# Patient Record
Sex: Female | Born: 1945
Health system: Southern US, Community
[De-identification: ages and names within clinical notes are randomized; demographics above are authoritative.]

## PROBLEM LIST (undated history)

## (undated) DIAGNOSIS — J189 Pneumonia, unspecified organism: Secondary | ICD-10-CM

## (undated) DIAGNOSIS — I10 Essential (primary) hypertension: Secondary | ICD-10-CM

## (undated) DIAGNOSIS — IMO0001 Reserved for inherently not codable concepts without codable children: Secondary | ICD-10-CM

## (undated) DIAGNOSIS — F32A Depression, unspecified: Secondary | ICD-10-CM

## (undated) DIAGNOSIS — I509 Heart failure, unspecified: Secondary | ICD-10-CM

## (undated) DIAGNOSIS — H269 Unspecified cataract: Secondary | ICD-10-CM

## (undated) DIAGNOSIS — J449 Chronic obstructive pulmonary disease, unspecified: Secondary | ICD-10-CM

## (undated) DIAGNOSIS — C801 Malignant (primary) neoplasm, unspecified: Secondary | ICD-10-CM

## (undated) DIAGNOSIS — D649 Anemia, unspecified: Secondary | ICD-10-CM

## (undated) DIAGNOSIS — Z5189 Encounter for other specified aftercare: Secondary | ICD-10-CM

## (undated) DIAGNOSIS — F329 Major depressive disorder, single episode, unspecified: Secondary | ICD-10-CM

## (undated) DIAGNOSIS — R911 Solitary pulmonary nodule: Secondary | ICD-10-CM

## (undated) HISTORY — PX: APPENDECTOMY: SHX54

## (undated) HISTORY — PX: ESOPHAGOGASTRODUODENOSCOPY ENDOSCOPY: SHX5814

## (undated) HISTORY — DX: Essential (primary) hypertension: I10

## (undated) HISTORY — DX: Heart failure, unspecified: I50.9

## (undated) HISTORY — DX: Solitary pulmonary nodule: R91.1

## (undated) HISTORY — DX: Pneumonia, unspecified organism: J18.9

## (undated) HISTORY — DX: Major depressive disorder, single episode, unspecified: F32.9

## (undated) HISTORY — DX: Encounter for other specified aftercare: Z51.89

## (undated) HISTORY — DX: Reserved for inherently not codable concepts without codable children: IMO0001

## (undated) HISTORY — DX: Anemia, unspecified: D64.9

## (undated) HISTORY — PX: TUBAL LIGATION: SHX77

## (undated) HISTORY — DX: Depression, unspecified: F32.A

## (undated) HISTORY — PX: TONSILLECTOMY: SUR1361

## (undated) HISTORY — PX: LUNG SURGERY: SHX703

---

## 1998-02-01 ENCOUNTER — Other Ambulatory Visit: Admission: RE | Admit: 1998-02-01 | Discharge: 1998-02-01 | Payer: Self-pay | Admitting: *Deleted

## 2005-07-10 ENCOUNTER — Ambulatory Visit: Payer: Self-pay | Admitting: Internal Medicine

## 2005-09-10 ENCOUNTER — Ambulatory Visit: Payer: Self-pay | Admitting: Internal Medicine

## 2005-10-05 ENCOUNTER — Ambulatory Visit: Payer: Self-pay | Admitting: Internal Medicine

## 2005-10-09 ENCOUNTER — Ambulatory Visit: Payer: Self-pay | Admitting: *Deleted

## 2008-10-11 ENCOUNTER — Ambulatory Visit: Payer: Self-pay | Admitting: Family Medicine

## 2008-10-11 DIAGNOSIS — I10 Essential (primary) hypertension: Secondary | ICD-10-CM | POA: Insufficient documentation

## 2008-10-11 DIAGNOSIS — M549 Dorsalgia, unspecified: Secondary | ICD-10-CM | POA: Insufficient documentation

## 2008-10-13 LAB — CONVERTED CEMR LAB
ALT: 17 units/L (ref 0–35)
AST: 22 units/L (ref 0–37)
Albumin: 3.7 g/dL (ref 3.5–5.2)
Alkaline Phosphatase: 50 units/L (ref 39–117)
BUN: 9 mg/dL (ref 6–23)
Basophils Absolute: 0 10*3/uL (ref 0.0–0.1)
Basophils Relative: 0.3 % (ref 0.0–3.0)
Bilirubin, Direct: 0.1 mg/dL (ref 0.0–0.3)
CO2: 26 meq/L (ref 19–32)
Calcium: 8.8 mg/dL (ref 8.4–10.5)
Chloride: 103 meq/L (ref 96–112)
Cholesterol: 180 mg/dL (ref 0–200)
Creatinine, Ser: 0.5 mg/dL (ref 0.4–1.2)
Eosinophils Absolute: 0.1 10*3/uL (ref 0.0–0.7)
Eosinophils Relative: 1.8 % (ref 0.0–5.0)
GFR calc Af Amer: 161 mL/min
GFR calc non Af Amer: 133 mL/min
Glucose, Bld: 91 mg/dL (ref 70–99)
HCT: 40.3 % (ref 36.0–46.0)
HDL: 70.7 mg/dL (ref >39.0)
Hemoglobin: 14.1 g/dL (ref 12.0–15.0)
LDL Cholesterol: 94 mg/dL (ref 0–99)
Lymphocytes Relative: 32.3 % (ref 12.0–46.0)
MCHC: 35.1 g/dL (ref 30.0–36.0)
MCV: 98.9 fL (ref 78.0–100.0)
Monocytes Absolute: 0.4 10*3/uL (ref 0.1–1.0)
Monocytes Relative: 7.8 % (ref 3.0–12.0)
Neutro Abs: 3.1 10*3/uL (ref 1.4–7.7)
Neutrophils Relative %: 57.8 % (ref 43.0–77.0)
Platelets: 199 10*3/uL (ref 150–400)
Potassium: 4 meq/L (ref 3.5–5.1)
RBC: 4.07 M/uL (ref 3.87–5.11)
RDW: 12.1 % (ref 11.5–14.6)
Sodium: 135 meq/L (ref 135–145)
Total Bilirubin: 0.8 mg/dL (ref 0.3–1.2)
Total CHOL/HDL Ratio: 2.5
Total Protein: 6.3 g/dL (ref 6.0–8.3)
Triglycerides: 78 mg/dL (ref 0–149)
VLDL: 16 mg/dL (ref 0–40)
WBC: 5.3 10*3/uL (ref 4.5–10.5)

## 2008-10-15 ENCOUNTER — Encounter (INDEPENDENT_AMBULATORY_CARE_PROVIDER_SITE_OTHER): Payer: Self-pay | Admitting: *Deleted

## 2008-10-29 ENCOUNTER — Ambulatory Visit: Payer: Self-pay | Admitting: Family Medicine

## 2008-11-12 LAB — CONVERTED CEMR LAB
BUN: 10 mg/dL (ref 6–23)
CO2: 28 meq/L (ref 19–32)
Calcium: 8.7 mg/dL (ref 8.4–10.5)
Chloride: 95 meq/L — ABNORMAL LOW (ref 96–112)
Creatinine, Ser: 0.5 mg/dL (ref 0.4–1.2)
GFR calc Af Amer: 161 mL/min
GFR calc non Af Amer: 133 mL/min
Glucose, Bld: 86 mg/dL (ref 70–99)
Potassium: 4.5 meq/L (ref 3.5–5.1)
Sodium: 128 meq/L — ABNORMAL LOW (ref 135–145)

## 2008-11-15 ENCOUNTER — Encounter (INDEPENDENT_AMBULATORY_CARE_PROVIDER_SITE_OTHER): Payer: Self-pay | Admitting: *Deleted

## 2009-01-28 ENCOUNTER — Ambulatory Visit: Payer: Self-pay | Admitting: Family Medicine

## 2009-02-01 ENCOUNTER — Encounter (INDEPENDENT_AMBULATORY_CARE_PROVIDER_SITE_OTHER): Payer: Self-pay | Admitting: *Deleted

## 2009-02-01 LAB — CONVERTED CEMR LAB
BUN: 8 mg/dL (ref 6–23)
CO2: 28 meq/L (ref 19–32)
Calcium: 8.6 mg/dL (ref 8.4–10.5)
Chloride: 94 meq/L — ABNORMAL LOW (ref 96–112)
Creatinine, Ser: 0.5 mg/dL (ref 0.4–1.2)
GFR calc non Af Amer: 132.85 mL/min (ref >60)
Glucose, Bld: 87 mg/dL (ref 70–99)
Potassium: 3.7 meq/L (ref 3.5–5.1)
Sodium: 127 meq/L — ABNORMAL LOW (ref 135–145)

## 2009-10-11 ENCOUNTER — Ambulatory Visit: Payer: Self-pay | Admitting: Family Medicine

## 2009-11-28 ENCOUNTER — Ambulatory Visit: Payer: Self-pay | Admitting: Family Medicine

## 2009-11-28 DIAGNOSIS — N390 Urinary tract infection, site not specified: Secondary | ICD-10-CM | POA: Insufficient documentation

## 2009-11-28 DIAGNOSIS — J069 Acute upper respiratory infection, unspecified: Secondary | ICD-10-CM | POA: Insufficient documentation

## 2009-11-28 LAB — CONVERTED CEMR LAB
Bilirubin Urine: NEGATIVE
Glucose, Urine, Semiquant: NEGATIVE
Ketones, urine, test strip: NEGATIVE
Nitrite: NEGATIVE
Protein, U semiquant: NEGATIVE
Specific Gravity, Urine: 1.02
Urobilinogen, UA: 0.2
WBC Urine, dipstick: NEGATIVE
pH: 6.5

## 2009-11-30 ENCOUNTER — Encounter: Payer: Self-pay | Admitting: Family Medicine

## 2009-12-06 ENCOUNTER — Telehealth (INDEPENDENT_AMBULATORY_CARE_PROVIDER_SITE_OTHER): Payer: Self-pay | Admitting: *Deleted

## 2009-12-12 ENCOUNTER — Ambulatory Visit: Payer: Self-pay | Admitting: Family Medicine

## 2009-12-12 LAB — CONVERTED CEMR LAB
Bilirubin Urine: NEGATIVE
Blood in Urine, dipstick: NEGATIVE
Glucose, Urine, Semiquant: NEGATIVE
Ketones, urine, test strip: NEGATIVE
Nitrite: NEGATIVE
Protein, U semiquant: NEGATIVE
Specific Gravity, Urine: 1.015
Urobilinogen, UA: 0.2
WBC Urine, dipstick: NEGATIVE
pH: 7

## 2009-12-15 LAB — CONVERTED CEMR LAB
ALT: 18 units/L (ref 0–35)
AST: 22 units/L (ref 0–37)
Albumin: 3.7 g/dL (ref 3.5–5.2)
Alkaline Phosphatase: 59 units/L (ref 39–117)
BUN: 15 mg/dL (ref 6–23)
Bilirubin, Direct: 0.1 mg/dL (ref 0.0–0.3)
CO2: 30 meq/L (ref 19–32)
Calcium: 8.9 mg/dL (ref 8.4–10.5)
Chloride: 103 meq/L (ref 96–112)
Cholesterol: 163 mg/dL (ref 0–200)
Creatinine, Ser: 0.7 mg/dL (ref 0.4–1.2)
GFR calc non Af Amer: 89.85 mL/min (ref >60)
Glucose, Bld: 85 mg/dL (ref 70–99)
HDL: 63 mg/dL (ref >39.00)
LDL Cholesterol: 77 mg/dL (ref 0–99)
Potassium: 4.9 meq/L (ref 3.5–5.1)
Sodium: 135 meq/L (ref 135–145)
Total Bilirubin: 0.3 mg/dL (ref 0.3–1.2)
Total CHOL/HDL Ratio: 3
Total Protein: 6.4 g/dL (ref 6.0–8.3)
Triglycerides: 114 mg/dL (ref 0.0–149.0)
VLDL: 22.8 mg/dL (ref 0.0–40.0)

## 2010-08-21 ENCOUNTER — Ambulatory Visit: Payer: Self-pay | Admitting: Family Medicine

## 2010-11-07 NOTE — Assessment & Plan Note (Signed)
Summary: RENEW MEDICATION/RH......   Vital Signs:  Patient profile:   65 year old female Weight:      151 pounds Temp:     98.2 degrees F oral Pulse rate:   68 / minute Pulse rhythm:   regular BP sitting:   118 / 60  (left arm) Cuff size:   regular  Vitals Entered By: Army Fossa CMA (October 11, 2009 3:37 PM) CC: follow up on medication    History of Present Illness:  Hypertension follow-up      This is a 65 year old woman who presents for Hypertension follow-up.  The patient denies lightheadedness, urinary frequency, headaches, edema, impotence, rash, and fatigue.  The patient denies the following associated symptoms: chest pain, chest pressure, exercise intolerance, dyspnea, palpitations, syncope, leg edema, and pedal edema.  Compliance with medications (by patient report) has been near 100%.  The patient reports that dietary compliance has been good.  Adjunctive measures currently used by the patient include salt restriction.    Current Medications (verified): 1)  Lisinopril-Hydrochlorothiazide 20-25 Mg Tabs (Lisinopril-Hydrochlorothiazide) .Marland Kitchen.. 1 By Mouth Once Daily. Needs Office Visit Before Additional Refills.  Allergies: 1)  ! Codeine  Past History:  Past medical, surgical, family and social histories (including risk factors) reviewed for relevance to current acute and chronic problems.  Past Medical History: Reviewed history from 10/11/2008 and no changes required. Hypertension  Family History: Reviewed history and no changes required.  Social History: Reviewed history from 10/11/2008 and no changes required. Occupation: Current Smoker  Review of Systems      See HPI  Physical Exam  General:  Well-developed,well-nourished,in no acute distress; alert,appropriate and cooperative throughout examination Lungs:  Normal respiratory effort, chest expands symmetrically. Lungs are clear to auscultation, no crackles or wheezes. Heart:  normal rate and no murmur.    Extremities:  No clubbing, cyanosis, edema, or deformity noted with normal full range of motion of all joints.   Skin:  Intact without suspicious lesions or rashes Psych:  Oriented X3 and normally interactive.     Impression & Recommendations:  Problem # 1:  HYPERTENSION (ICD-401.9)  Her updated medication list for this problem includes:    Lisinopril-hydrochlorothiazide 20-25 Mg Tabs (Lisinopril-hydrochlorothiazide) .Marland Kitchen... 1 by mouth once daily. needs office visit before additional refills.  BP today: 118/60 Prior BP: 110/64 (01/28/2009)  Labs Reviewed: K+: 3.7 (01/28/2009) Creat: : 0.5 (01/28/2009)   Chol: 180 (10/11/2008)   HDL: 70.7 (10/11/2008)   LDL: 94 (10/11/2008)   TG: 78 (10/11/2008)  Complete Medication List: 1)  Lisinopril-hydrochlorothiazide 20-25 Mg Tabs (Lisinopril-hydrochlorothiazide) .Marland Kitchen.. 1 by mouth once daily. needs office visit before additional refills.  Patient Instructions: 1)  RTO 3 MONTHS CPE Prescriptions: LISINOPRIL-HYDROCHLOROTHIAZIDE 20-25 MG TABS (LISINOPRIL-HYDROCHLOROTHIAZIDE) 1 by mouth once daily. NEEDS OFFICE VISIT BEFORE ADDITIONAL REFILLS.  #30 x 3   Entered and Authorized by:   Loreen Freud DO   Signed by:   Loreen Freud DO on 10/11/2009   Method used:   [facility]        [facility] [address] [phone]* (retail)       [address]       [place], [place]  [address]       Ph: [phone] or [phone]       Fax: [phone]   RxID:   [id number]

## 2010-11-07 NOTE — Assessment & Plan Note (Signed)
Summary: FOLLOW UP AND MED RENEWAL//PH   Vital Signs:  Patient profile:   65 year old female Weight:      154 pounds Pulse rate:   62 / minute Pulse rhythm:   regular BP sitting:   120 / 84  (left arm) Cuff size:   regular  Vitals Entered By: Army Fossa CMA (December 12, 2009 3:23 PM) CC: follow up on urinary sypmtoms doing much better, refill on BP meds   History of Present Illness: Pt here to recheck urinary symptoms---   they have resolved and recheck bp.   No other complaints.  Current Medications (verified): 1)  Lisinopril-Hydrochlorothiazide 20-25 Mg Tabs (Lisinopril-Hydrochlorothiazide) .Marland Kitchen.. 1 By Mouth Once Daily. Needs Office Visit Before Additional Refills.  Allergies (verified): 1)  ! Codeine  Past History:  Past medical, surgical, family and social histories (including risk factors) reviewed for relevance to current acute and chronic problems.  Past Medical History: Reviewed history from 10/11/2008 and no changes required. Hypertension  Family History: Reviewed history and no changes required.  Social History: Reviewed history from 10/11/2008 and no changes required. Occupation: Current Smoker  Review of Systems      See HPI  Physical Exam  General:  Well-developed,well-nourished,in no acute distress; alert,appropriate and cooperative throughout examination Lungs:  Normal respiratory effort, chest expands symmetrically. Lungs are clear to auscultation, no crackles or wheezes. Heart:  normal rate and no murmur.   Extremities:  No clubbing, cyanosis, edema, or deformity noted with normal full range of motion of all joints.   Psych:  Cognition and judgment appear intact. Alert and cooperative with normal attention span and concentration. No apparent delusions, illusions, hallucinations   Impression & Recommendations:  Problem # 1:  HYPERTENSION (ICD-401.9)  Her updated medication list for this problem includes:    Lisinopril-hydrochlorothiazide  20-25 Mg Tabs (Lisinopril-hydrochlorothiazide) .Marland Kitchen... 1 by mouth once daily. needs office visit before additional refills.  Orders: Venipuncture ([address]) TLB-Lipid Panel (80061-LIPID) TLB-BMP (Basic Metabolic Panel-BMET) (80048-METABOL) TLB-Hepatic/Liver Function Pnl (80076-HEPATIC)  BP today: 120/84 Prior BP: 116/64 (11/28/2009)  Labs Reviewed: K+: 3.7 (01/28/2009) Creat: : 0.5 (01/28/2009)   Chol: 180 (10/11/2008)   HDL: 70.7 (10/11/2008)   LDL: 94 (10/11/2008)   TG: 78 (10/11/2008)  Problem # 2:  UTI (ICD-599.0) Assessment: Improved  Encouraged to push clear liquids, get enough rest, and take acetaminophen as needed. To be seen in 10 days if no improvement, sooner if worse.  Complete Medication List: 1)  Lisinopril-hydrochlorothiazide 20-25 Mg Tabs (Lisinopril-hydrochlorothiazide) .Marland Kitchen.. 1 by mouth once daily. needs office visit before additional refills. Prescriptions: LISINOPRIL-HYDROCHLOROTHIAZIDE 20-25 MG TABS (LISINOPRIL-HYDROCHLOROTHIAZIDE) 1 by mouth once daily. NEEDS OFFICE VISIT BEFORE ADDITIONAL REFILLS.  #30 x 5   Entered and Authorized by:   Loreen Freud DO   Signed by:   Loreen Freud DO on 12/12/2009   Method used:   [facility]        [facility] [address] [phone]* (retail)       [address]       [place], [place]  [address]       Ph: [phone] or [phone]       Fax: [phone]   RxID:   [id number]   Laboratory Results   Urine Tests    Routine Urinalysis   Color: yellow Appearance: Clear Glucose: negative   (Normal Range: Negative) Bilirubin: negative   (Normal Range: Negative) Ketone: negative   (Normal Range: Negative) Spec. Gravity: 1.015   (  Normal Range: 1.003-1.035) Blood: negative   (Normal Range: Negative) pH: 7.0   (Normal Range: 5.0-8.0) Protein: negative   (Normal Range: Negative) Urobilinogen: 0.2   (Normal Range: 0-1) Nitrite: negative   (Normal Range: Negative) Leukocyte Esterace: negative    (Normal Range: Negative)    Comments: Army Fossa CMA  December 12, 2009 3:28 PM

## 2010-11-07 NOTE — Progress Notes (Signed)
Summary: Lab Results  Phone Note Outgoing Call   Summary of Call: Regarding lab results, LMTCB:  + UTI---on cipro Signed by Loreen Freud DO on 12/03/2009 at 9:24 PM  Follow-up for Phone Call        Pt is aware. Army Fossa CMA  December 06, 2009 10:26 AM

## 2010-11-07 NOTE — Assessment & Plan Note (Signed)
Summary: acute pain left hip/cbs   Vital Signs:  Patient Profile:   65 Years Old Female Weight:      134.6 pounds Temp:     98.0 degrees F oral Pulse rate:   60 / minute BP sitting:   160 / 100  (left arm)  Pt. in pain?   no  Vitals Entered By: Jeremy Johann CMA (October 11, 2008 11:47 AM)                  PCP:  Laury Axon  Chief Complaint:  pull mucsle in lower left back and Back pain.  History of Present Illness:  Back Pain      This is a 65 year old woman who presents with Back pain.  The symptoms began duration > 3 days ago.  Pt here c/o L low back pain that radiates to calf.  No known injury.  The patient denies fever, chills, weakness, loss of sensation, fecal incontinence, urinary incontinence, urinary retention, dysuria, rest pain, inability to work, and inability to care for self.  The pain is located in the left low back.  The pain began at home.  The pain radiates to the left hip and left buttock.  The pain is made worse by standing or walking and activity.  The pain is made better by inactivity and NSAID medications.  Risk factors for serious underlying conditions include duration of pain > 1 month.  Pt tried heat  and Ibuprofen with no relief.      Serial Vital Signs/Assessments:  Time      Position  BP       Pulse  Resp  Temp     By 12:02 PM            160/100                        Loreen Freud DO    Current Allergies (reviewed today): ! CODEINE  Past Medical History:    Hypertension   Social History:    Occupation:    Current Smoker   Risk Factors:  Tobacco use:  current    Counseled to quit/cut down tobacco use:  yes   Review of Systems      See HPI   Physical Exam  General:     Well-developed,well-nourished,in no acute distress; alert,appropriate and cooperative throughout examination Neck:     No deformities, masses, or tenderness noted. Lungs:     Normal respiratory effort, chest expands symmetrically. Lungs are clear to auscultation,  no crackles or wheezes. Heart:     normal rate and regular rhythm.   Msk:     normal ROM, no joint tenderness, no joint swelling, no joint warmth, no redness over joints, no joint deformities, no joint instability, and no crepitation.   Extremities:     No clubbing, cyanosis, edema, or deformity noted with normal full range of motion of all joints.   Neurologic:     cranial nerves II-XII intact, strength normal in all extremities, gait normal, and DTRs symmetrical and normal.   Skin:     Intact without suspicious lesions or rashes Psych:     Cognition and judgment appear intact. Alert and cooperative with normal attention span and concentration. No apparent delusions, illusions, hallucinations    Impression & Recommendations:  Problem # 1:  HYPERTENSION (ICD-401.9)  Her updated medication list for this problem includes:    Lisinopril-hydrochlorothiazide 10-12.5 Mg Tabs (Lisinopril-hydrochlorothiazide) .Marland KitchenMarland KitchenMarland KitchenMarland Kitchen 1  by mouth once daily  Orders: Venipuncture ([address]) TLB-Lipid Panel (80061-LIPID) TLB-BMP (Basic Metabolic Panel-BMET) (80048-METABOL) TLB-CBC Platelet - w/Differential (85025-CBCD) TLB-Hepatic/Liver Function Pnl (80076-HEPATIC)  BP today: 160/100   Problem # 2:  BACK PAIN (ICD-724.5)  Her updated medication list for this problem includes:    Flexeril 10 Mg Tabs (Cyclobenzaprine hcl) .Marland Kitchen... 1 by mouth three times a day as needed Discussed use of moist heat or ice, modified activities, medications, and stretching/strengthening exercises. Back care instructions given. To be seen in 2 weeks if no improvement; sooner if worsening of symptoms.   Complete Medication List: 1)  Lisinopril-hydrochlorothiazide 10-12.5 Mg Tabs (Lisinopril-hydrochlorothiazide) .Marland Kitchen.. 1 by mouth once daily 2)  Flexeril 10 Mg Tabs (Cyclobenzaprine hcl) .Marland Kitchen.. 1 by mouth three times a day as needed   Patient Instructions: 1)  rto 2-3 weeks 2)  Eat more potassium rich foods such as bananas,orange juice  and salt substitutes. 3)  Tobacco is very bad for your health and your loved ones! You Should stop smoking!Marland Kitchen 4)  Most patients (90%) with low back pain will improve with time (2-6 weeks). Keep active but avoid activities that are painful. Apply moist heat and/or ice to lower back several times a day.   Prescriptions: FLEXERIL 10 MG TABS (CYCLOBENZAPRINE HCL) 1 by mouth three times a day as needed  #30 x 0   Entered and Authorized by:   Loreen Freud DO   Signed by:   Loreen Freud DO on 10/11/2008   Method used:   [facility] to        [facility] [address] [phone]* (retail)       [address]       [place], [place]  [address]       Ph: [phone] or [phone]       Fax: [phone]   RxID:   [id number] LISINOPRIL-HYDROCHLOROTHIAZIDE 10-12.5 MG TABS (LISINOPRIL-HYDROCHLOROTHIAZIDE) 1 by mouth once daily  #30 x 2   Entered and Authorized by:   Loreen Freud DO   Signed by:   Loreen Freud DO on 10/11/2008   Method used:   Electronically to        [facility] [address] [phone]* (retail)       [address]       [place], [place]  [address]       Ph: [phone] or [phone]       Fax: [phone]   RxID:   [id number]  ]  Past Medical History:    Hypertension

## 2010-11-07 NOTE — Assessment & Plan Note (Signed)
Summary: BLADDER INFECTION?/KDC   Vital Signs:  Patient profile:   65 year old female Height:      63 inches Weight:      154 pounds BMI:     27.38 Temp:     98.1 degrees F oral Pulse rate:   68 / minute Pulse rhythm:   regular BP sitting:   116 / 64  (left arm) Cuff size:   regular  Vitals Entered By: Army Fossa CMA (November 28, 2009 2:47 PM) CC: Pt c/o feeling like she has to urinate more frequently, painful when urniating. , Dysuria   History of Present Illness:  Dysuria      This is a 65 year old woman who presents with Dysuria.  The symptoms began 2 days ago.  The patient complains of burning with urination and urgency, but denies urinary frequency, hematuria, vaginal discharge, vaginal itching, and vaginal sores.  The patient denies the following associated symptoms: nausea, vomiting, fever, shaking chills, flank pain, abdominal pain, back pain, pelvic pain, and arthralgias.  The patient denies the following risk factors: diabetes, prior antibiotics, immunosuppression, history of GU anomaly, history of pyelonephritis, history of STD, and analgesic abuse.  History is significant for no urinary tract problems.    Allergies: 1)  ! Codeine  Past History:  Past medical, surgical, family and social histories (including risk factors) reviewed for relevance to current acute and chronic problems.  Past Medical History: Reviewed history from 10/11/2008 and no changes required. Hypertension  Family History: Reviewed history and no changes required.  Social History: Reviewed history from 10/11/2008 and no changes required. Occupation: Current Smoker  Review of Systems      See HPI  Physical Exam  General:  Well-developed,well-nourished,in no acute distress; alert,appropriate and cooperative throughout examination Abdomen:  soft and non-tender.   Psych:  Oriented X3 and normally interactive.     Impression & Recommendations:  Problem # 1:  UTI (ICD-599.0)  Her  updated medication list for this problem includes:    Cipro 500 Mg Tabs (Ciprofloxacin hcl) .Marland Kitchen... 1 by mouth two times a day  Encouraged to push clear liquids, get enough rest, and take acetaminophen as needed. To be seen in 10 days if no improvement, sooner if worse.  Orders: T-Culture, Urine ([id number])  Complete Medication List: 1)  Lisinopril-hydrochlorothiazide 20-25 Mg Tabs (Lisinopril-hydrochlorothiazide) .Marland Kitchen.. 1 by mouth once daily. needs office visit before additional refills. 2)  Cipro 500 Mg Tabs (Ciprofloxacin hcl) .Marland Kitchen.. 1 by mouth two times a day  Patient Instructions: 1)  recheck urine 2 weeks Prescriptions: CIPRO 500 MG TABS (CIPROFLOXACIN HCL) 1 by mouth two times a day  #10 x 0   Entered and Authorized by:   Loreen Freud DO   Signed by:   Loreen Freud DO on 11/28/2009   Method used:   Electronically to        [facility] [address] [phone]* (retail)       [address]       [place], [place]  [address]       Ph: [phone] or [phone]       Fax: [phone]   RxID:   [id number]   Laboratory Results   Urine Tests    Routine Urinalysis   Color: yellow Appearance: Clear Glucose: negative   (Normal Range: Negative) Bilirubin: negative   (Normal Range: Negative) Ketone: negative   (Normal Range: Negative) Spec. Gravity: 1.020   (Normal Range: 1.003-1.035)  Blood: large   (Normal Range: Negative) pH: 6.5   (Normal Range: 5.0-8.0) Protein: negative   (Normal Range: Negative) Urobilinogen: 0.2   (Normal Range: 0-1) Nitrite: negative   (Normal Range: Negative) Leukocyte Esterace: negative   (Normal Range: Negative)    Comments: Army Fossa CMA  November 28, 2009 2:57 PM     Appended Document: BLADDER INFECTION?/KDC  Laboratory Results   Urine Tests   Date/Time Reported: November 29, 2009 1:25 PM   Routine Urinalysis   Color: yellow Appearance: Clear Glucose: negative   (Normal Range: Negative) Bilirubin:  negative   (Normal Range: Negative) Ketone: moderate (40)   (Normal Range: Negative) Spec. Gravity: 1.020   (Normal Range: 1.003-1.035) Blood: moderate   (Normal Range: Negative) pH: 6.0   (Normal Range: 5.0-8.0) Protein: negative   (Normal Range: Negative) Urobilinogen: negative   (Normal Range: 0-1) Nitrite: positive   (Normal Range: Negative) Leukocyte Esterace: negative   (Normal Range: Negative)    Comments: Floydene Flock  November 29, 2009 1:26 PM cx sent

## 2010-11-07 NOTE — Letter (Signed)
Summary: Results Follow-up Letter  [facility]  [address] [place], [place] [address]   Phone: [phone]  Fax: [phone]    10/15/2008        Pam Cameron [address] [place], [place]  [address]  Dear [name],   The following are the results of your recent test(s):  Test     Result     Pap Smear    Normal_______  Not Normal_____       Comments: _________________________________________________________ Cholesterol LDL(Bad cholesterol):          Your goal is less than:         HDL (Good cholesterol):        Your goal is more than: _________________________________________________________ Other Tests:   _________________________________________________________  Please call for an appointment Or Please see attached labs._________________________________________________________ _________________________________________________________ _________________________________________________________  Sincerely,  Felecia Deloach CMA [facility]

## 2010-11-07 NOTE — Assessment & Plan Note (Signed)
Summary: refill med/cbs   Vital Signs:  Patient profile:   65 year old female Weight:      157.4 pounds Pulse rate:   76 / minute Pulse rhythm:   regular BP sitting:   116 / 70  (left arm) Cuff size:   regular  Vitals Entered By: Almeta Monas CMA Duncan Dull) (August 21, 2010 3:05 PM) CC: OV to    History of Present Illness:  Hypertension follow-up      This is a 65 year old woman who presents for Hypertension follow-up.  The patient denies lightheadedness, urinary frequency, headaches, edema, impotence, rash, and fatigue.  The patient denies the following associated symptoms: chest pain, chest pressure, exercise intolerance, dyspnea, palpitations, syncope, leg edema, and pedal edema.  Compliance with medications (by patient report) has been near 100%.  The patient reports that dietary compliance has been good.  The patient reports no exercise.  Adjunctive measures currently used by the patient include salt restriction.    Allergies: 1)  ! Codeine  Past History:  Past medical, surgical, family and social histories (including risk factors) reviewed for relevance to current acute and chronic problems.  Past Medical History: Reviewed history from 10/11/2008 and no changes required. Hypertension  Family History: Reviewed history and no changes required.  Social History: Reviewed history from 10/11/2008 and no changes required. Occupation: Current Smoker  Physical Exam  General:  Well-developed,well-nourished,in no acute distress; alert,appropriate and cooperative throughout examination Neck:  No deformities, masses, or tenderness noted. Lungs:  Normal respiratory effort, chest expands symmetrically. Lungs are clear to auscultation, no crackles or wheezes. Heart:  Normal rate and regular rhythm. S1 and S2 normal without gallop, murmur, click, rub or other extra sounds. Extremities:  No clubbing, cyanosis, edema, or deformity noted with normal full range of motion of all joints.    Psych:  Cognition and judgment appear intact. Alert and cooperative with normal attention span and concentration. No apparent delusions, illusions, hallucinations   Impression & Recommendations:  Problem # 1:  HYPERTENSION (ICD-401.9)  Her updated medication list for this problem includes:    Lisinopril-hydrochlorothiazide 20-25 Mg Tabs (Lisinopril-hydrochlorothiazide) .Marland Kitchen... 1 by mouth once daily.  BP today: 116/70 Prior BP: 120/84 (12/12/2009)  Labs Reviewed: K+: 4.9 (12/12/2009) Creat: : 0.7 (12/12/2009)   Chol: 163 (12/12/2009)   HDL: 63.00 (12/12/2009)   LDL: 77 (12/12/2009)   TG: 114.0 (12/12/2009)  Complete Medication List: 1)  Lisinopril-hydrochlorothiazide 20-25 Mg Tabs (Lisinopril-hydrochlorothiazide) .Marland Kitchen.. 1 by mouth once daily.  Patient Instructions: 1)  Please schedule a follow-up appointment in 6 months---cpe  Prescriptions: LISINOPRIL-HYDROCHLOROTHIAZIDE 20-25 MG TABS (LISINOPRIL-HYDROCHLOROTHIAZIDE) 1 by mouth once daily.  #30 x 5   Entered and Authorized by:   Loreen Freud DO   Signed by:   Loreen Freud DO on 08/21/2010   Method used:   Electronically to        [facility] [address] [phone]* (retail)       [address]       [place], [place]  [address]       Ph: [phone] or [phone]       Fax: [phone]   RxID:   [id number]    Orders Added: 1)  Est. Patient Level III [[address]]

## 2011-02-06 ENCOUNTER — Other Ambulatory Visit: Payer: Self-pay | Admitting: Family Medicine

## 2011-02-17 ENCOUNTER — Encounter: Payer: Self-pay | Admitting: Family Medicine

## 2011-02-20 ENCOUNTER — Encounter: Payer: Self-pay | Admitting: Family Medicine

## 2011-02-20 ENCOUNTER — Ambulatory Visit (INDEPENDENT_AMBULATORY_CARE_PROVIDER_SITE_OTHER): Payer: 59 | Admitting: Family Medicine

## 2011-02-20 VITALS — HR 84 | Temp 98.9°F | Wt 160.8 lb

## 2011-02-20 DIAGNOSIS — I10 Essential (primary) hypertension: Secondary | ICD-10-CM

## 2011-02-20 MED ORDER — LISINOPRIL-HYDROCHLOROTHIAZIDE 10-12.5 MG PO TABS: 1.0000 | ORAL_TABLET | Freq: Every day | ORAL | Status: DC

## 2011-02-20 NOTE — Progress Notes (Signed)
  Subjective:    Patient here for follow-up of elevated blood pressure.  She is not exercising and is adherent to a low-salt diet.  Blood pressure is well controlled at home. Cardiac symptoms: none. Patient denies: chest pain, dyspnea, fatigue and palpitations. Cardiovascular risk factors: hypertension, sedentary lifestyle and smoking/ tobacco exposure. Use of agents associated with hypertension: none. History of target organ damage: none.  The following portions of the patient's history were reviewed and updated as appropriate: allergies, current medications, past family history, past medical history, past social history, past surgical history and problem list.  Review of Systems Pertinent items are noted in HPI.     Objective:    Pulse 84  Temp(Src) 98.9 F (37.2 C) (Oral)  Wt 160 lb 12.8 oz (72.938 kg)  SpO2 96% General appearance: alert, cooperative, appears stated age and no distress Neck: no adenopathy, no carotid bruit, no JVD, supple, symmetrical, trachea midline and thyroid not enlarged, symmetric, no tenderness/mass/nodules Lungs: clear to auscultation bilaterally Heart: regular rate and rhythm, S1, S2 normal, no murmur, click, rub or gallop Extremities: extremities normal, atraumatic, no cyanosis or edema    Assessment:    Hypertension, normal blood pressure . Evidence of target organ damage: none.    Plan:    Medication: decrease to lisinopril 10/12.5 daily. Dietary sodium restriction. Regular aerobic exercise. Follow up: 3 weeks and as needed.

## 2011-02-20 NOTE — Patient Instructions (Signed)

## 2011-03-07 ENCOUNTER — Encounter: Payer: Self-pay | Admitting: Family Medicine

## 2011-03-13 ENCOUNTER — Ambulatory Visit (INDEPENDENT_AMBULATORY_CARE_PROVIDER_SITE_OTHER): Payer: 59 | Admitting: Family Medicine

## 2011-03-13 ENCOUNTER — Encounter: Payer: Self-pay | Admitting: Family Medicine

## 2011-03-13 DIAGNOSIS — I10 Essential (primary) hypertension: Secondary | ICD-10-CM

## 2011-03-13 DIAGNOSIS — E785 Hyperlipidemia, unspecified: Secondary | ICD-10-CM

## 2011-03-13 LAB — LIPID PANEL
Cholesterol: 189 mg/dL (ref 0–200)
HDL: 81.8 mg/dL (ref >39.00)
LDL Cholesterol: 94 mg/dL (ref 0–99)
Total CHOL/HDL Ratio: 2
Triglycerides: 64 mg/dL (ref 0.0–149.0)
VLDL: 12.8 mg/dL (ref 0.0–40.0)

## 2011-03-13 LAB — POCT URINALYSIS DIPSTICK
Bilirubin, UA: NEGATIVE
Glucose, UA: NEGATIVE
Ketones, UA: NEGATIVE
Leukocytes, UA: NEGATIVE
Nitrite, UA: NEGATIVE
Protein, UA: NEGATIVE
Spec Grav, UA: 1.005
Urobilinogen, UA: 0.2
pH, UA: 7.5

## 2011-03-13 LAB — BASIC METABOLIC PANEL
BUN: 9 mg/dL (ref 6–23)
CO2: 27 mEq/L (ref 19–32)
Calcium: 9 mg/dL (ref 8.4–10.5)
Chloride: 95 mEq/L — ABNORMAL LOW (ref 96–112)
Creatinine, Ser: 0.7 mg/dL (ref 0.4–1.2)
GFR: 90.99 mL/min (ref >60.00)
Glucose, Bld: 100 mg/dL — ABNORMAL HIGH (ref 70–99)
Potassium: 4.5 mEq/L (ref 3.5–5.1)
Sodium: 130 mEq/L — ABNORMAL LOW (ref 135–145)

## 2011-03-13 LAB — HEPATIC FUNCTION PANEL
ALT: 18 U/L (ref 0–35)
AST: 21 U/L (ref 0–37)
Albumin: 4 g/dL (ref 3.5–5.2)
Alkaline Phosphatase: 66 U/L (ref 39–117)
Bilirubin, Direct: 0.1 mg/dL (ref 0.0–0.3)
Total Bilirubin: 0.6 mg/dL (ref 0.3–1.2)
Total Protein: 6.4 g/dL (ref 6.0–8.3)

## 2011-03-13 MED ORDER — LISINOPRIL-HYDROCHLOROTHIAZIDE 20-12.5 MG PO TABS: 1.0000 | ORAL_TABLET | Freq: Every day | ORAL | Status: DC

## 2011-03-13 NOTE — Progress Notes (Signed)
  Subjective:    Patient ID: Pam Cameron, female    DOB: [birth year], 65 y.o.   MRN: [id number]  Hypertension This is a chronic problem. The current episode started more than 1 year ago. The problem has been gradually improving since onset. The problem is uncontrolled. Pertinent negatives include no anxiety, blurred vision, chest pain, headaches, malaise/fatigue, neck pain, orthopnea, palpitations, peripheral edema, PND, shortness of breath or sweats. (Pt feels better with lower dose but bp is high today.  Repeat was the same.) There are no associated agents to hypertension. Risk factors for coronary artery disease include post-menopausal state, sedentary lifestyle and smoking/tobacco exposure.      Review of Systems  Constitutional: Negative for malaise/fatigue.  HENT: Negative for neck pain.   Eyes: Negative for blurred vision.  Respiratory: Negative for shortness of breath.   Cardiovascular: Negative for chest pain, palpitations, orthopnea and PND.  Neurological: Negative for headaches.  All other systems reviewed and are negative.       Objective:   Physical Exam  Constitutional: She appears well-developed and well-nourished.  Psychiatric: She has a normal mood and affect. Her behavior is normal.          Assessment & Plan:

## 2011-03-13 NOTE — Assessment & Plan Note (Signed)
Check labs today Lisinopril 20 / 12.5 rto 2-3 weeks Pt will think about getting a cuff for home

## 2011-03-13 NOTE — Patient Instructions (Signed)

## 2011-04-04 ENCOUNTER — Ambulatory Visit (INDEPENDENT_AMBULATORY_CARE_PROVIDER_SITE_OTHER): Payer: 59 | Admitting: Family Medicine

## 2011-04-04 ENCOUNTER — Encounter: Payer: Self-pay | Admitting: Family Medicine

## 2011-04-04 VITALS — BP 132/70 | HR 69 | Temp 97.9°F | Wt 161.6 lb

## 2011-04-04 DIAGNOSIS — E871 Hypo-osmolality and hyponatremia: Secondary | ICD-10-CM

## 2011-04-04 DIAGNOSIS — I10 Essential (primary) hypertension: Secondary | ICD-10-CM

## 2011-04-04 LAB — BASIC METABOLIC PANEL
BUN: 6 mg/dL (ref 6–23)
CO2: 29 mEq/L (ref 19–32)
Calcium: 8.8 mg/dL (ref 8.4–10.5)
Chloride: 91 mEq/L — ABNORMAL LOW (ref 96–112)
Creatinine, Ser: 0.5 mg/dL (ref 0.4–1.2)
GFR: 148.98 mL/min (ref >60.00)
Glucose, Bld: 77 mg/dL (ref 70–99)
Potassium: 4.4 mEq/L (ref 3.5–5.1)
Sodium: 127 mEq/L — ABNORMAL LOW (ref 135–145)

## 2011-04-04 NOTE — Progress Notes (Signed)
  Subjective:    Patient here for follow-up of elevated blood pressure.  She is not exercising and is adherent to a low-salt diet.  Blood pressure is not checked  at home. Cardiac symptoms: none. Patient denies: chest pain, chest pressure/discomfort, dyspnea, exertional chest pressure/discomfort, fatigue, irregular heart beat, orthopnea and palpitations. Cardiovascular risk factors: advanced age (older than 103 for men, 33 for women), hypertension and sedentary lifestyle. Use of agents associated with hypertension: none. History of target organ damage: none.  The following portions of the patient's history were reviewed and updated as appropriate: allergies, current medications, past family history, past medical history, past social history, past surgical history and problem list.  Review of Systems Pertinent items are noted in HPI.     Objective:    BP 132/70  Pulse 69  Temp(Src) 97.9 F (36.6 C) (Oral)  Wt 161 lb 9.6 oz (73.301 kg)  SpO2 99% General appearance: alert, cooperative, appears stated age and no distress Lungs: clear to auscultation bilaterally Heart: regular rate and rhythm, S1, S2 normal, no murmur, click, rub or gallop Extremities: extremities normal, atraumatic, no cyanosis or edema    Assessment:    Hypertension, normal blood pressure . Evidence of target organ damage: none.   low sodium----recheck today Plan:    Medication: no change. Regular aerobic exercise. Follow up: 6 months and as needed.

## 2011-04-08 ENCOUNTER — Inpatient Hospital Stay (INDEPENDENT_AMBULATORY_CARE_PROVIDER_SITE_OTHER)
Admission: RE | Admit: 2011-04-08 | Discharge: 2011-04-08 | Disposition: A | Discharge status: Home / Self Care | Payer: 59 | Source: Ambulatory Visit | Attending: Emergency Medicine | Admitting: Emergency Medicine

## 2011-04-08 DIAGNOSIS — S239XXA Sprain of unspecified parts of thorax, initial encounter: Secondary | ICD-10-CM

## 2011-04-16 ENCOUNTER — Inpatient Hospital Stay (INDEPENDENT_AMBULATORY_CARE_PROVIDER_SITE_OTHER)
Admission: RE | Admit: 2011-04-16 | Discharge: 2011-04-16 | Disposition: A | Discharge status: Home / Self Care | Payer: 59 | Source: Ambulatory Visit | Attending: Emergency Medicine | Admitting: Emergency Medicine

## 2011-04-16 DIAGNOSIS — M549 Dorsalgia, unspecified: Secondary | ICD-10-CM

## 2011-04-16 DIAGNOSIS — J40 Bronchitis, not specified as acute or chronic: Secondary | ICD-10-CM

## 2011-04-25 ENCOUNTER — Encounter: Payer: Self-pay | Admitting: Family Medicine

## 2011-04-25 ENCOUNTER — Ambulatory Visit (INDEPENDENT_AMBULATORY_CARE_PROVIDER_SITE_OTHER): Payer: 59 | Admitting: Family Medicine

## 2011-04-25 VITALS — BP 124/82 | HR 78 | Temp 97.9°F | Wt 156.8 lb

## 2011-04-25 DIAGNOSIS — L658 Other specified nonscarring hair loss: Secondary | ICD-10-CM | POA: Insufficient documentation

## 2011-04-25 DIAGNOSIS — L659 Nonscarring hair loss, unspecified: Secondary | ICD-10-CM

## 2011-04-25 LAB — BASIC METABOLIC PANEL
BUN: 8 mg/dL (ref 6–23)
CO2: 27 mEq/L (ref 19–32)
Calcium: 8.4 mg/dL (ref 8.4–10.5)
Chloride: 92 mEq/L — ABNORMAL LOW (ref 96–112)
Creatinine, Ser: 0.7 mg/dL (ref 0.4–1.2)
GFR: 86.6 mL/min (ref >60.00)
Glucose, Bld: 88 mg/dL (ref 70–99)
Potassium: 4.1 mEq/L (ref 3.5–5.1)
Sodium: 124 mEq/L — ABNORMAL LOW (ref 135–145)

## 2011-04-25 LAB — HEPATIC FUNCTION PANEL
ALT: 18 U/L (ref 0–35)
AST: 25 U/L (ref 0–37)
Albumin: 3.8 g/dL (ref 3.5–5.2)
Alkaline Phosphatase: 74 U/L (ref 39–117)
Bilirubin, Direct: 0 mg/dL (ref 0.0–0.3)
Total Bilirubin: 0.5 mg/dL (ref 0.3–1.2)
Total Protein: 6.6 g/dL (ref 6.0–8.3)

## 2011-04-25 LAB — TSH: TSH: 0.73 u[IU]/mL (ref 0.35–5.50)

## 2011-04-25 LAB — T4, FREE: Free T4: 1.21 ng/dL (ref 0.60–1.60)

## 2011-04-25 LAB — T3, FREE: T3, Free: 2.5 pg/mL (ref 2.3–4.2)

## 2011-04-25 NOTE — Progress Notes (Signed)
  Subjective:    Patient ID: Pam Cameron, female    DOB: Sep 08, 1946, 65 y.o.   MRN: [id number]  HPI Pt here c/o hair loss.  Pt went to see her hairdresser who told her to see her doctor.  She has not used any chemicals in her hair.  Pt states she doesn't eat much protein ---she has no other symptoms.   Review of Systems As above    Objective:   Physical Exam  Constitutional: She appears well-developed and well-nourished.  HENT:  Head: Normocephalic and atraumatic.       + mult areas of thinning hair on scalp          Assessment & Plan:

## 2011-04-25 NOTE — Patient Instructions (Signed)
Alopecia Areata, Autoimmune Hair Loss  Alopecia areata is a self-destructing (autoimmune) disease that results in the loss of hair. In this condition your body's immune system attacks the hair follicle. The hair follicle is responsible for growing hair. Hair loss can occur on the scalp and other parts of the body. It usually starts as one or more small, round, smooth patches of hair loss. It occurs in males and females of all ages and races, but usually starts before age 30. The scalp is the most commonly affected area, but the beard or any hair-bearing site can be affected. This type of hair loss does not leave scars where the hair was lost.    Many people with alopecia areata only have a few patches of hair loss. In others, extensive patchy hair loss occurs. In a few people, all scalp hair is lost (alopecia totalis), or hair is lost from the entire scalp and body (alopecia universalis). No matter how widespread the hair loss, the hair follicles remain alive and are ready to resume normal hair production whenever they receive the correct signal. Hair re-growth may occur without treatment and can even restart after years of hair loss.    CAUSES  It is thought that something triggers the immune system to stop hair growth. It is not always known what the cause is. Some people have genetic markers that can increase the chance of developing alopecia areata. Alopecia areata often occurs in families whose members have had:   Asthma.    Hay fever.    Atopic eczema.    Some autoimmune diseases may also be a trigger, such as:    Thyroid disease.    Diabetes.    Rheumatoid arthritis.    Lupus erythematosus.    Vitiligo.    Pernicious anemia.    Addison's disease.   OTHER SYMPTOMS  In some people, the nail beds may develop rows of tiny dents (stippling) or the nail beds can become distorted. Other than the hair and nail beds, no other body part is affected.    PROGNOSIS     Alopecia areata is not medically disabling. People with alopecia areata are usually in excellent health. Hair loss can be emotionally difficult. The National Alopecia Areata Foundation has resources available to help individuals and families with alopecia areata. Their goal is to help people with the condition live full, productive lives. There are many successful, well-adjusted, contented people living with Alopecia areata. Alopecia areata can be overcome with:   A positive self image.    Sound medical facts.    The support of others, such as:    Sometimes professional counseling is helpful to develop one's self-confidence and positive self-image.   TREATMENT  There is no cure for alopecia areata. There are several available treatments. Treatments are most effective in milder cases. No treatment is effective for everyone. Choice of treatment depends mainly on a person's age and the extent of their hair loss.  Alopecia areata occurs in two forms:     A mild patchy form where less than 50 percent of scalp hair is lost.    An extensive form where greater than 50 percent of scalp hair is lost.   These two forms of alopecia areata behave quite differently, and the choice of treatment depends on which form is present. Current treatments do not turn alopecia areata off. They can stimulate the hair follicle to produce hair.    Some medications used to treat mild cases include:      Cortisone injections. The most common treatment is the injection of cortisone into the bare skin patches. The injections are usually given by a caregiver specializing in skin issues (dermatologist). This caregiver will use a tiny needle to give multiple injections into the skin in and around the bare patches. The injections are repeated once a month. If new hair growth occurs, it is usually visible within 4 weeks. Treatment does not prevent new patches of hair loss from developing. There are few side effects from local cortisone injections. Occasionally, temporary dents (depressions) in the skin result from the local injections, but these dents can fill in by themselves.    Topical minoxidil. Five percent topical minoxidil solution applied twice daily may grow hair in alopecia areata. Scalp, eyebrows, and beard hair may respond. If scalp hair re-grows completely, treatment can be stopped. Response may improve if topical cortisone cream is applied 30 minutes after the minoxidil. Topical minoxidil is safe, easy to use, and does not lower blood pressure in persons with normal blood pressure. Minoxidil can lead to unwanted facial hair growth in some people.    Anthralin cream or ointment. Another treatment is the application of anthralin cream or ointment. Anthralin is a tar-like substance that has been used widely for psoriasis. Anthralin is applied to the bare patches once daily. It is washed off after a short time, usually 30 to 60 minutes later. If new hair growth occurs, it is seen in 8 to 12 weeks. Anthralin can be irritating to the skin. It can cause temporary, brownish discoloration of the treated skin. By using short treatment times, skin irritation and skin staining are reduced without decreasing effectiveness. Care must be taken not to get anthralin in the eyes.   Some of the medications used for more extensive cases where there is greater than 50% hair loss include:   Cortisone pills. Cortisone pills are sometimes given for extensive scalp hair loss. Cortisone taken internally is much stronger than local injections of cortisone into the skin. It is necessary to discuss possible side effects of cortisone pills with your caregiver. In general, however, cortisone pills are used in relatively few patients with alopecia areata due to health risks from prolonged use. Also, hair that has grown is likely to fall out when the cortisone pills are stopped.    Topical minoxidil. See previous explanation under mild, patchy alopecia areata. However, Minoxidil is not effective in total loss of scalp hair (alopecia totalis).    Topical immunotherapy. Another method of treating alopecia areata or alopecia totalis/universalis involves producing an allergic rash or allergic contact dermatitis. Chemicals such as diphencyprone (DPCP) or squaric acid dibutyl ester (SADBE) are applied to the scalp to produce an allergic rash which resembles poison oak or ivy. Approximately 40% of patients treated with topical immunotherapy will re-grow scalp hair after about 6 months of treatment. Those who do successfully re-grow scalp hair will need to continue treatment to maintain hair re-growth.    Wigs. For extensive hair loss, a wig can be an important option for some people. Proper attention will make a quality wig look completely natural. A wig will need to be cut, thinned, and styled. To keep a net base wig from falling off, special double-sided tape can be purchased in beauty supply outlets and fastened to the inside of the wig.     For those with completely bare heads, there are suction caps to which any wig can be attached. There are also entire suction cap wig units. These state-of-the-art   wigs which make use of a silicon base to create a secure, vacuum-fit, are comfortable and easily removed by the wearer. Proper fit of a vacuum wig requires that any existing scalp hair be shaved. These wigs can be more expensive than other types of wigs.   For more information contact the National Alopecia Areata Foundation at:  [employer]  [address]  [place] [address]  Phone: [phone]  Fax: [phone]  www.naaf.org  E-mail: info@naaf.org  Document Released: 04/28/2004 Document Re-Released: 03/14/2010  [employer] Patient Information 2011 [employer].

## 2011-04-25 NOTE — Assessment & Plan Note (Signed)
Check labs Pt will watch diet and make sure she is eating enough protein Biotin Consider Derm

## 2011-04-30 ENCOUNTER — Telehealth: Payer: Self-pay | Admitting: *Deleted

## 2011-04-30 ENCOUNTER — Other Ambulatory Visit: Payer: Self-pay | Admitting: Family Medicine

## 2011-04-30 DIAGNOSIS — L659 Nonscarring hair loss, unspecified: Secondary | ICD-10-CM

## 2011-04-30 NOTE — Telephone Encounter (Signed)
Pt would like to know if Dr Laury Axon feels she should go ahead and see specialist at [facility] for hair loss.Please advise

## 2011-04-30 NOTE — Telephone Encounter (Signed)
If she would like to we will put referral in

## 2011-04-30 NOTE — Telephone Encounter (Signed)
Discuss with patient, referral put in awaiting appt info.

## 2011-05-03 ENCOUNTER — Other Ambulatory Visit: Payer: 59

## 2011-05-10 ENCOUNTER — Other Ambulatory Visit: Payer: 59

## 2011-05-11 ENCOUNTER — Ambulatory Visit (INDEPENDENT_AMBULATORY_CARE_PROVIDER_SITE_OTHER): Payer: 59 | Admitting: Internal Medicine

## 2011-05-11 ENCOUNTER — Encounter: Payer: Self-pay | Admitting: Internal Medicine

## 2011-05-11 VITALS — BP 118/70 | HR 76 | Temp 98.5°F | Wt 156.4 lb

## 2011-05-11 DIAGNOSIS — J209 Acute bronchitis, unspecified: Secondary | ICD-10-CM

## 2011-05-11 DIAGNOSIS — B9689 Other specified bacterial agents as the cause of diseases classified elsewhere: Secondary | ICD-10-CM

## 2011-05-11 DIAGNOSIS — F17200 Nicotine dependence, unspecified, uncomplicated: Secondary | ICD-10-CM

## 2011-05-11 DIAGNOSIS — A499 Bacterial infection, unspecified: Secondary | ICD-10-CM

## 2011-05-11 DIAGNOSIS — J329 Chronic sinusitis, unspecified: Secondary | ICD-10-CM

## 2011-05-11 LAB — CBC WITH DIFFERENTIAL/PLATELET
Basophils Absolute: 0 10*3/uL (ref 0.0–0.1)
Basophils Relative: 0.6 % (ref 0.0–3.0)
Eosinophils Absolute: 0.2 10*3/uL (ref 0.0–0.7)
Eosinophils Relative: 2.7 % (ref 0.0–5.0)
HCT: 38.5 % (ref 36.0–46.0)
Hemoglobin: 13.1 g/dL (ref 12.0–15.0)
Lymphocytes Relative: 22.2 % (ref 12.0–46.0)
Lymphs Abs: 1.3 10*3/uL (ref 0.7–4.0)
MCHC: 33.9 g/dL (ref 30.0–36.0)
MCV: 96.7 fl (ref 78.0–100.0)
Monocytes Absolute: 0.5 10*3/uL (ref 0.1–1.0)
Monocytes Relative: 8.3 % (ref 3.0–12.0)
Neutro Abs: 4 10*3/uL (ref 1.4–7.7)
Neutrophils Relative %: 66.2 % (ref 43.0–77.0)
Platelets: 215 10*3/uL (ref 150.0–400.0)
RBC: 3.99 Mil/uL (ref 3.87–5.11)
RDW: 14.1 % (ref 11.5–14.6)
WBC: 6 10*3/uL (ref 4.5–10.5)

## 2011-05-11 MED ORDER — AMOXICILLIN-POT CLAVULANATE 875-125 MG PO TABS: 1.0000 | ORAL_TABLET | Freq: Two times a day (BID) | ORAL | Status: AC

## 2011-05-11 NOTE — Patient Instructions (Signed)
Plain Mucinex for thick secretions ;force NON dairy fluids for next 48 hrs. Use a Neti pot daily as needed for sinus congestion Order for x-rays entered into  the computer; these will be performed at [address] [facility]. across from [facility]. No appointment is necessary. Please think about quitting smoking. Review the risks we discussed. Please call 1-800-QUIT-NOW 5036426467) for free smoking cessation counseling.

## 2011-05-11 NOTE — Progress Notes (Signed)
  Subjective:    Patient ID: Pam Cameron, female    DOB: 1946/07/27, 65 y.o.   MRN: [id number]  HPI Respiratory tract infection Onset/symptoms:7-10 days ago as head congestion Exposures (illness/environmental/extrinsic):? Progression of symptoms:rhinitis with PNDrainage  & cough Treatments/response:none Fever/chills/sweats:no Frontal headache:yes Facial pain:yes Nasal purulence:slightly yellow Sore throat:yes Dental pain:no Lymphadenopathy:no Wheezing/shortness of breath:no Cough/sputum:pale yellow Associated extrinsic/allergic symptoms:itchy eyes/ sneezing:no Smoking history:1 ppd   For the history reveals that she has had similar symptoms for at least 5 weeks. History with a seven-day regimen of an unknown antibiotic at that time from an UC. She does not feel that that was effective beyond a" couple of days".           Review of Systems she is also here to have her "salt" recheck; apparently she has had hyponatremia.  Old labs were reviewed. She's actually had hyponatremia dating at least back to 2010.     Objective:   Physical Exam General appearance is of good health and nourishment; no acute distress or increased work of breathing is present.  No  lymphadenopathy about the head, neck, or axilla noted.   Eyes: No conjunctival inflammation or lid edema is present..  Ears:  External ear exam shows no significant lesions or deformities.  Otoscopic examination reveals clear canals, tympanic membranes are intact bilaterally without bulging, retraction, inflammation or discharge.  Nose:  External nasal examination shows no deformity or inflammation. Nasal mucosa are pink and moist without lesions or exudates. No septal dislocation or dislocation.No obstruction to airflow.   Oral exam: Dental hygiene is good; lips and gums are healthy appearing.There is no oropharyngeal erythema or exudate noted.   Heart:  Normal rate and regular rhythm. S1 and S2 normal without gallop,  murmur, click, rub or other extra sounds.   Lungs:Chest clear to auscultation; no wheezes, rhonchi,rales ,or rubs present.No increased work of breathing.    Extremities:  No cyanosis, edema, or clubbing  noted    Skin: Warm & dry          Assessment & Plan:  #1 rhinosinusitis  #2 bronchitis; the additional history suggests that this is a protracted acute process.  #3 active smoker; risks discussed  #4 codeine intolerance  #5 hyponatremia, chronic. With the chronicity of this problem; it's unlikely that this is related to any pulmonary process. She is on HCTZ combined with an ACE inhibitor.The HCTZ is most likely cause of hyponatremia  Plan see orders and recommendations

## 2011-05-30 ENCOUNTER — Ambulatory Visit (INDEPENDENT_AMBULATORY_CARE_PROVIDER_SITE_OTHER)
Admission: RE | Admit: 2011-05-30 | Discharge: 2011-05-30 | Disposition: A | Discharge status: Home / Self Care | Payer: 59 | Source: Ambulatory Visit | Attending: Internal Medicine | Admitting: Internal Medicine

## 2011-05-30 DIAGNOSIS — F17200 Nicotine dependence, unspecified, uncomplicated: Secondary | ICD-10-CM

## 2011-05-30 DIAGNOSIS — J209 Acute bronchitis, unspecified: Secondary | ICD-10-CM

## 2011-06-02 ENCOUNTER — Other Ambulatory Visit: Payer: Self-pay | Admitting: Family Medicine

## 2011-06-20 ENCOUNTER — Encounter: Payer: Self-pay | Admitting: Family Medicine

## 2011-08-29 ENCOUNTER — Other Ambulatory Visit: Payer: Self-pay | Admitting: Family Medicine

## 2011-10-10 ENCOUNTER — Encounter: Payer: Self-pay | Admitting: Internal Medicine

## 2011-10-10 ENCOUNTER — Ambulatory Visit (INDEPENDENT_AMBULATORY_CARE_PROVIDER_SITE_OTHER): Payer: 59 | Admitting: Family Medicine

## 2011-10-10 ENCOUNTER — Encounter: Payer: Self-pay | Admitting: Family Medicine

## 2011-10-10 VITALS — BP 120/74 | HR 105 | Temp 98.7°F | Wt 145.0 lb

## 2011-10-10 DIAGNOSIS — R935 Abnormal findings on diagnostic imaging of other abdominal regions, including retroperitoneum: Secondary | ICD-10-CM

## 2011-10-10 DIAGNOSIS — Z23 Encounter for immunization: Secondary | ICD-10-CM

## 2011-10-10 DIAGNOSIS — M549 Dorsalgia, unspecified: Secondary | ICD-10-CM

## 2011-10-10 LAB — POCT URINALYSIS DIPSTICK
Bilirubin, UA: NEGATIVE
Blood, UA: NEGATIVE
Glucose, UA: NEGATIVE
Ketones, UA: NEGATIVE
Leukocytes, UA: NEGATIVE
Nitrite, UA: NEGATIVE
Protein, UA: NEGATIVE
Spec Grav, UA: 1.01
Urobilinogen, UA: 0.2
pH, UA: 5

## 2011-10-10 LAB — CBC WITH DIFFERENTIAL/PLATELET
Basophils Absolute: 0.1 10*3/uL (ref 0.0–0.1)
Basophils Relative: 1 % (ref 0.0–3.0)
Eosinophils Absolute: 0.2 10*3/uL (ref 0.0–0.7)
Eosinophils Relative: 2.8 % (ref 0.0–5.0)
HCT: 32.6 % — ABNORMAL LOW (ref 36.0–46.0)
Hemoglobin: 11.2 g/dL — ABNORMAL LOW (ref 12.0–15.0)
Lymphocytes Relative: 23.8 % (ref 12.0–46.0)
Lymphs Abs: 1.4 10*3/uL (ref 0.7–4.0)
MCHC: 34.3 g/dL (ref 30.0–36.0)
MCV: 96.5 fl (ref 78.0–100.0)
Monocytes Absolute: 0.4 10*3/uL (ref 0.1–1.0)
Monocytes Relative: 7.1 % (ref 3.0–12.0)
Neutro Abs: 3.9 10*3/uL (ref 1.4–7.7)
Neutrophils Relative %: 65.3 % (ref 43.0–77.0)
Platelets: 397 10*3/uL (ref 150.0–400.0)
RBC: 3.38 Mil/uL — ABNORMAL LOW (ref 3.87–5.11)
RDW: 13.6 % (ref 11.5–14.6)
WBC: 6 10*3/uL (ref 4.5–10.5)

## 2011-10-10 LAB — BASIC METABOLIC PANEL
BUN: 9 mg/dL (ref 6–23)
CO2: 27 mEq/L (ref 19–32)
Calcium: 8.9 mg/dL (ref 8.4–10.5)
Chloride: 99 mEq/L (ref 96–112)
Creatinine, Ser: 0.6 mg/dL (ref 0.4–1.2)
GFR: 110.98 mL/min (ref >60.00)
Glucose, Bld: 91 mg/dL (ref 70–99)
Potassium: 4.4 mEq/L (ref 3.5–5.1)
Sodium: 132 mEq/L — ABNORMAL LOW (ref 135–145)

## 2011-10-10 LAB — HEPATIC FUNCTION PANEL
ALT: 15 U/L (ref 0–35)
AST: 19 U/L (ref 0–37)
Albumin: 3.8 g/dL (ref 3.5–5.2)
Alkaline Phosphatase: 68 U/L (ref 39–117)
Bilirubin, Direct: 0 mg/dL (ref 0.0–0.3)
Total Bilirubin: 0.5 mg/dL (ref 0.3–1.2)
Total Protein: 6.6 g/dL (ref 6.0–8.3)

## 2011-10-10 NOTE — Assessment & Plan Note (Signed)
Pt wants to hold off on any further workup until after GI sees her

## 2011-10-10 NOTE — Assessment & Plan Note (Signed)
Refer to GI for further evaluation If pt develops any abd pain rto or go to ER--- will hold off on surgery referral until after GI w/u

## 2011-10-10 NOTE — Patient Instructions (Signed)
We will set up an appointment to see a Gastroenterologist----if you develop any abdominal pain, call office or go to Er. We will do your physical in June with fasting labs.

## 2011-10-10 NOTE — Progress Notes (Signed)
  Subjective:    Patient ID: Pam Cameron, female    DOB: 07-27-1946, 66 y.o.   MRN: [id number]  HPI Pt here to review CT scan ordered by chiropractor.  + Gallstones and abnormality in stomach.  Pt needs GI evaluation.  Pt denies any abd pain, indigestion or upper back pain.  No pain with eating.     Review of Systems As above    Objective:   Physical Exam  Constitutional: She is oriented to person, place, and time. She appears well-developed and well-nourished.  Pulmonary/Chest: Effort normal and breath sounds normal.  Abdominal: Soft. She exhibits no distension. There is no tenderness. There is no rebound and no guarding.  Neurological: She is alert and oriented to person, place, and time.  Psychiatric: She has a normal mood and affect. Her behavior is normal.          Assessment & Plan:

## 2011-10-22 ENCOUNTER — Encounter: Payer: Self-pay | Admitting: Internal Medicine

## 2011-10-25 ENCOUNTER — Encounter: Payer: Self-pay | Admitting: Internal Medicine

## 2011-10-25 ENCOUNTER — Ambulatory Visit (INDEPENDENT_AMBULATORY_CARE_PROVIDER_SITE_OTHER): Payer: 59 | Admitting: Internal Medicine

## 2011-10-25 VITALS — BP 140/80 | HR 70 | Ht 63.0 in | Wt 145.2 lb

## 2011-10-25 DIAGNOSIS — D649 Anemia, unspecified: Secondary | ICD-10-CM

## 2011-10-25 DIAGNOSIS — IMO0002 Reserved for concepts with insufficient information to code with codable children: Secondary | ICD-10-CM

## 2011-10-25 DIAGNOSIS — R933 Abnormal findings on diagnostic imaging of other parts of digestive tract: Secondary | ICD-10-CM

## 2011-10-25 DIAGNOSIS — Z1211 Encounter for screening for malignant neoplasm of colon: Secondary | ICD-10-CM

## 2011-10-25 DIAGNOSIS — R9389 Abnormal findings on diagnostic imaging of other specified body structures: Secondary | ICD-10-CM

## 2011-10-25 NOTE — Progress Notes (Signed)
Subjective:    Patient ID: Pam Cameron, female    DOB: [birth year], 66 y.o.   MRN: [id number]  HPI Pam Cameron is a 66 yo female with PMH of HTN, MDD, anemia, tobacco use who is seen in consultation at the request of Dr. Laury Axon for evaluation of abnormal GI imaging. The patient reports developing mid back pain in early December 2012, which started after bending over to adjust something in her shower. She reports hearing a pop and has had mid back pain ever since. She reports being evaluated by chiropractor first, and evaluated with CT scan which showed compression fractures in her lumbar spine. She also reports this imaging study revealed gallstones.  From a symptom standpoint, she is denying GI complaint. She is having daily mid back pain which is dull in nature. Tends to be worse with prolonged standing or when she is changing positions. Occasionally it moves around towards her sides but does not radiate further. She denies abdominal pain. No nausea or vomiting. No heartburn. No early satiety. No dysphagia or odynophagia. Her bowel movements been very regular without bright red blood per rectum or melena. She denies fevers or chills.   Review of Systems Constitutional: Negative for fever, chills, night sweats, activity change, appetite change and unexpected weight change HEENT: Negative for sore throat, mouth sores and trouble swallowing. Eyes: Negative for visual disturbance Respiratory: Negative for cough, chest tightness and shortness of breath Cardiovascular: Negative for chest pain, palpitations and lower extremity swelling Gastrointestinal: See history of present illness Genitourinary: Negative for dysuria and hematuria. Musculoskeletal: Positive for back pain, negative arthralgias and myalgias Skin: Negative for rash or color change Neurological: Negative for headaches, weakness, numbness Hematological: Negative for adenopathy, negative for easy  bruising/bleeding Psychiatric/behavioral: Negative for depressed mood, negative for anxiety   Past Medical History  Diagnosis Date  . Hypertension   . Anemia   . Pneumonia   . Depression    Current Outpatient Prescriptions  Medication Sig Dispense Refill  . IRON PO Take 1 tablet by mouth daily.      Marland Kitchen BIOTIN PO Take by mouth daily.        . flintstones complete (FLINTSTONES) 60 MG chewable tablet Chew 1 tablet by mouth daily.        Marland Kitchen lisinopril-hydrochlorothiazide (PRINZIDE,ZESTORETIC) 20-12.5 MG per tablet TAKE 1 TABLET BY MOUTH DAILY.  30 tablet  2   Allergies  Allergen Reactions  . Codeine     Abdominal cramping    Social History  . Marital Status: Single   Occupational History  . sales    Social History Main Topics  . Smoking status: Current Everyday Smoker  . Smokeless tobacco: None  . Alcohol Use: Yes     moderate  . Drug Use: Yes    Family History  Problem Relation Age of Onset  . Ovarian cancer Mother   . Prostate cancer Father   . Hypertension Father   . Hypertension Mother       Objective:   Physical Exam BP 140/80  Pulse 70  Ht 5\' 3"  (1.6 m)  Wt 145 lb 3.2 oz (65.862 kg)  BMI 25.72 kg/m2 Constitutional: Well-developed and well-nourished. No distress. HEENT: Normocephalic and atraumatic. Oropharynx is clear and moist. No oropharyngeal exudate. Conjunctivae are normal. Pupils are equal round and reactive to light. No scleral icterus. Neck: Neck supple. Trachea midline. Cardiovascular: Normal rate, regular rhythm and intact distal pulses. No M/R/G Pulmonary/chest: Effort normal and breath sounds normal. No wheezing, rales or  rhonchi. Abdominal: Soft, nontender, nondistended. Bowel sounds active throughout. There are no masses palpable. No hepatosplenomegaly. Extremities: no clubbing, cyanosis, or edema Lymphadenopathy: No cervical adenopathy noted. Neurological: Alert and oriented to person place and time. Skin: Skin is warm and dry. No rashes  noted. Psychiatric: Normal mood and affect. Behavior is normal.  CT scan abd/pelvis with contrast 09/18/11 Impression: 1. Asymmetric thickening posterior gastric fundus wall, appearance concerning for an infiltrative process. Upper GI or endoscopy recommended for further assessment. 2. Cholelithiasis. 3. Nondilated calcific atherosclerotic vascular disease 4. T11, T12, and L3 compression fracture deformities as above. Suspect acute endplate disruption lower L3 vertebra. Lumbar spine MRI would be beneficial to assess for marrow edema associated with acute fracture 5. Generalized osteopenia  CBC    Component Value Date/Time   WBC 6.0 10/10/2011 0858   RBC 3.38* 10/10/2011 0858   HGB 11.2* 10/10/2011 0858   HCT 32.6* 10/10/2011 0858   PLT 397.0 10/10/2011 0858   MCV 96.5 10/10/2011 0858   MCHC 34.3 10/10/2011 0858   RDW 13.6 10/10/2011 0858   LYMPHSABS 1.4 10/10/2011 0858   MONOABS 0.4 10/10/2011 0858   EOSABS 0.2 10/10/2011 0858   BASOSABS 0.1 10/10/2011 0858    CMP     Component Value Date/Time   NA 132* 10/10/2011 0858   K 4.4 10/10/2011 0858   CL 99 10/10/2011 0858   CO2 27 10/10/2011 0858   GLUCOSE 91 10/10/2011 0858   BUN 9 10/10/2011 0858   CREATININE 0.6 10/10/2011 0858   CALCIUM 8.9 10/10/2011 0858   PROT 6.6 10/10/2011 0858   ALBUMIN 3.8 10/10/2011 0858   AST 19 10/10/2011 0858   ALT 15 10/10/2011 0858   ALKPHOS 68 10/10/2011 0858   BILITOT 0.5 10/10/2011 0858   GFRNONAA 89.85 12/12/2009 1549   GFRAA 161 10/29/2008 1359       Assessment & Plan:   66 yo female with PMH of HTN, MDD, anemia, tobacco use who is seen in consultation at the request of Dr. Laury Axon for evaluation of abnormal GI imaging  1. Abnl GI Imaging -- the patient is having no concerning GI symptoms at present, however given her abnormal CT scan, I do recommend proceeding with upper endoscopy. We discussed this test including its risks and benefits, and she is agreeable to proceed. This test will be scheduled today.  2. CRC screening -- the  patient is average risk for colorectal cancer screening, and has never had a colonoscopy. She also does not recall ever having done FOBT.  I've recommended colonoscopy for today, and she is agreeable, however she would like this to be done after her upper endoscopy. I will place a recall for colonoscopy in 2-3 months.  3. Back pain -- the patient's back pain is very likely related to her vertebral fractures. I've advised an orthopedic referral for further management. She deferred directed referral today, and states she will contact them when she completes her upper endoscopy. She denied needing pain medications today for back pain  4. Anemia -- the patient does have been normocytic anemia. There is no evidence of iron deficiency, though iron studies have not been performed recently. This anemia does appear to be somewhat new. We will proceed with upper endoscopy, and screening colonoscopy also in the coming months. We'll defer further evaluation to her PCP for now as there does not seem to be overt bleeding.

## 2011-10-25 NOTE — Patient Instructions (Signed)
You have been scheduled for an endoscopy. Please follow written instructions given to you at your visit today.   Dr. Rhea Belton would like to see you back in 3 months for a Colonoscopy.

## 2011-11-05 ENCOUNTER — Ambulatory Visit (AMBULATORY_SURGERY_CENTER): Payer: 59 | Admitting: Internal Medicine

## 2011-11-05 ENCOUNTER — Other Ambulatory Visit: Payer: Self-pay | Admitting: Internal Medicine

## 2011-11-05 ENCOUNTER — Encounter: Payer: Self-pay | Admitting: Internal Medicine

## 2011-11-05 DIAGNOSIS — K2980 Duodenitis without bleeding: Secondary | ICD-10-CM

## 2011-11-05 DIAGNOSIS — K2970 Gastritis, unspecified, without bleeding: Secondary | ICD-10-CM

## 2011-11-05 DIAGNOSIS — K2990 Gastroduodenitis, unspecified, without bleeding: Secondary | ICD-10-CM

## 2011-11-05 DIAGNOSIS — R9389 Abnormal findings on diagnostic imaging of other specified body structures: Secondary | ICD-10-CM

## 2011-11-05 MED ORDER — PANTOPRAZOLE SODIUM 40 MG PO TBEC: 40.0000 mg | DELAYED_RELEASE_TABLET | Freq: Every day | ORAL | Status: DC

## 2011-11-05 MED ORDER — SODIUM CHLORIDE 0.9 % IV SOLN: 500.0000 mL | INTRAVENOUS | Status: DC

## 2011-11-05 NOTE — Progress Notes (Signed)
Patient did not experience any of the following events: a burn prior to discharge; a fall within the facility; wrong site/side/patient/procedure/implant event; or a hospital transfer or hospital admission upon discharge from the facility. (G8907) Patient did not have preoperative order for IV antibiotic SSI prophylaxis. (G8918)  

## 2011-11-05 NOTE — Op Note (Signed)
[facility] [address] [place], [place]  [address]  ENDOSCOPY PROCEDURE REPORT  PATIENT:  Pam, Cameron  MR#:  [id number] BIRTHDATE:  [birth year], 65 yrs. old  GENDER:  female ENDOSCOPIST:  Carie Caddy. Lakyra Tippins, MD Referred by:  Loreen Freud, DO PROCEDURE DATE:  11/05/2011 PROCEDURE:  EGD with biopsy, 19147 ASA CLASS:  Class II INDICATIONS:  abnormal imaging (CT abd) MEDICATIONS:   MAC sedation, administered by CRNA, propofol (Diprivan) 300 mg IV TOPICAL ANESTHETIC:  none  DESCRIPTION OF PROCEDURE:   After the risks benefits and alternatives of the procedure were thoroughly explained, informed consent was obtained.  The LB [id number]-[id number] endoscope was introduced through the mouth and advanced to the second portion of the duodenum, without limitations.  The instrument was slowly withdrawn as the mucosa was fully examined. <<PROCEDUREIMAGES>>  The esophagus and gastroesophageal junction were completely normal in appearance.  Moderate gastritis was found in the total stomach. Multiple biopsies were obtained and sent to pathology.  Question of portal gastropathy in the gastric cardia and fundus. Duodenitis characterized by erythema and superficial, punctate ulceration was found in the bulb of the duodenum. Multiple biopsies were obtained and sent to pathology.    Retroflexed views revealed findings as previously described.    The scope was then withdrawn from the patient and the procedure completed.  COMPLICATIONS:  None  ENDOSCOPIC IMPRESSION: 1) Normal esophagus 2) Moderate gastritis in the total stomach.  Multiple biopsies performed and sent to pathology. 3) Question of portal gastropathy in the cardia/fundus 4) Duodenitis in the bulb of duodenum. Multiple biopsies performed and sent to pathology.  RECOMMENDATIONS: 1) Await pathology results 2) Begin pantoprazole 40 mg daily for acid suppression.  This should be taken 30 minutes to 1 hour before your 1st meal  of the day. 3) Further recommendations after pathology results are available.   Carie Caddy. Shuronda Santino, MD  CC:  Lelon Perla, DO The Patient  n. eSIGNEDCarie Caddy. Luddie Boghosian at 11/05/2011 10:44 AM  Concha [place], [phone]

## 2011-11-05 NOTE — Patient Instructions (Addendum)
FOLLOW DISCHARGE INSTRUCTIONS (BLUE AND GREEN SHEETS).  BEGIN PANTOPRAZOLE 40 MG. DAILY FOR ACID SUPPRESSION. SHOULD BE TAKEN 30 MINUTES BEFORE FIRST MEAL OF THE DAY.

## 2011-11-06 ENCOUNTER — Telehealth: Payer: Self-pay | Admitting: *Deleted

## 2011-11-06 NOTE — Telephone Encounter (Signed)
No answer, no voice mail on follow-up call.

## 2011-11-09 ENCOUNTER — Encounter: Payer: Self-pay | Admitting: *Deleted

## 2011-11-12 ENCOUNTER — Telehealth: Payer: Self-pay | Admitting: *Deleted

## 2011-11-12 ENCOUNTER — Encounter: Payer: Self-pay | Admitting: Internal Medicine

## 2011-11-12 MED ORDER — AMOXICILL-CLARITHRO-LANSOPRAZ PO MISC: ORAL | Status: DC

## 2011-11-12 NOTE — Telephone Encounter (Signed)
Message copied by Florene Glen on Mon Nov 12, 2011  8:25 AM ------      Message from: Beverley Fiedler      Created: Fri Nov 09, 2011  4:09 PM       H. pylori positive by pathology      Please start the patient on triple therapy      She should follow with me in a few months

## 2011-11-12 NOTE — Telephone Encounter (Signed)
Notified pt she is positive for H. Pylori and needs to start antibiotics. Pt stated understanding and reminder sent to f/u in a few months.

## 2011-11-13 ENCOUNTER — Other Ambulatory Visit: Payer: Self-pay | Admitting: Family Medicine

## 2011-11-13 ENCOUNTER — Other Ambulatory Visit: Payer: Self-pay | Admitting: Gastroenterology

## 2011-11-13 ENCOUNTER — Other Ambulatory Visit: Payer: 59

## 2011-11-13 DIAGNOSIS — Z1211 Encounter for screening for malignant neoplasm of colon: Secondary | ICD-10-CM

## 2011-11-13 LAB — FECAL OCCULT BLOOD, IMMUNOCHEMICAL: Fecal Occult Bld: NEGATIVE

## 2011-11-26 ENCOUNTER — Telehealth: Payer: Self-pay | Admitting: Internal Medicine

## 2011-11-26 ENCOUNTER — Other Ambulatory Visit: Payer: Self-pay | Admitting: Internal Medicine

## 2011-11-26 ENCOUNTER — Other Ambulatory Visit: Payer: Self-pay | Admitting: Family Medicine

## 2011-11-26 DIAGNOSIS — IMO0002 Reserved for concepts with insufficient information to code with codable children: Secondary | ICD-10-CM

## 2011-11-26 DIAGNOSIS — S32000A Wedge compression fracture of unspecified lumbar vertebra, initial encounter for closed fracture: Secondary | ICD-10-CM

## 2011-11-26 MED ORDER — LISINOPRIL-HYDROCHLOROTHIAZIDE 20-12.5 MG PO TABS: 1.0000 | ORAL_TABLET | Freq: Every day | ORAL | Status: DC

## 2011-11-26 NOTE — Telephone Encounter (Signed)
Faxed.   KP 

## 2011-11-26 NOTE — Telephone Encounter (Signed)
Pt wanted advice on her compression fractures. Explained Dr Rhea Belton hasn't been here long enough to get to know all the docs in the area. Suggested she see an IR md for possible vertibropasty or kyphoplasty. Pt notifed of an appt with Dr Treasa School at [facility] on 12/04/11 at 2pm. She was given directions to go past the [facility] , turn right and follow the signs to radiology; pt stated understanding. Faxed info to [phone] attn: Karene Fry.

## 2011-12-04 ENCOUNTER — Ambulatory Visit (HOSPITAL_COMMUNITY)
Admission: RE | Admit: 2011-12-04 | Discharge: 2011-12-04 | Disposition: A | Discharge status: Home / Self Care | Payer: 59 | Source: Ambulatory Visit | Attending: Internal Medicine | Admitting: Internal Medicine

## 2011-12-04 DIAGNOSIS — IMO0002 Reserved for concepts with insufficient information to code with codable children: Secondary | ICD-10-CM

## 2011-12-05 ENCOUNTER — Other Ambulatory Visit (HOSPITAL_COMMUNITY): Payer: Self-pay | Admitting: Interventional Radiology

## 2011-12-05 ENCOUNTER — Other Ambulatory Visit: Payer: Self-pay | Admitting: Radiology

## 2011-12-05 DIAGNOSIS — IMO0002 Reserved for concepts with insufficient information to code with codable children: Secondary | ICD-10-CM

## 2011-12-07 ENCOUNTER — Other Ambulatory Visit: Payer: Self-pay | Admitting: Radiology

## 2011-12-13 ENCOUNTER — Telehealth (HOSPITAL_COMMUNITY): Payer: Self-pay

## 2011-12-13 NOTE — Telephone Encounter (Signed)
Spoke with Pam Cameron to make sure we were still on schedule for procedure

## 2011-12-14 ENCOUNTER — Encounter (HOSPITAL_COMMUNITY): Payer: Self-pay

## 2011-12-14 ENCOUNTER — Ambulatory Visit (HOSPITAL_COMMUNITY)
Admission: RE | Admit: 2011-12-14 | Discharge: 2011-12-14 | Disposition: A | Discharge status: Home / Self Care | Payer: 59 | Source: Ambulatory Visit | Attending: Interventional Radiology | Admitting: Interventional Radiology

## 2011-12-14 ENCOUNTER — Other Ambulatory Visit (HOSPITAL_COMMUNITY): Payer: Self-pay | Admitting: Interventional Radiology

## 2011-12-14 DIAGNOSIS — IMO0002 Reserved for concepts with insufficient information to code with codable children: Secondary | ICD-10-CM

## 2011-12-14 DIAGNOSIS — M8448XA Pathological fracture, other site, initial encounter for fracture: Secondary | ICD-10-CM | POA: Insufficient documentation

## 2011-12-14 DIAGNOSIS — M810 Age-related osteoporosis without current pathological fracture: Secondary | ICD-10-CM | POA: Insufficient documentation

## 2011-12-14 DIAGNOSIS — I10 Essential (primary) hypertension: Secondary | ICD-10-CM | POA: Insufficient documentation

## 2011-12-14 DIAGNOSIS — F3289 Other specified depressive episodes: Secondary | ICD-10-CM | POA: Insufficient documentation

## 2011-12-14 DIAGNOSIS — F329 Major depressive disorder, single episode, unspecified: Secondary | ICD-10-CM | POA: Insufficient documentation

## 2011-12-14 HISTORY — PX: FIXATION KYPHOPLASTY LUMBAR SPINE: SHX1642

## 2011-12-14 LAB — CBC
HCT: 38.4 % (ref 36.0–46.0)
Hemoglobin: 13.8 g/dL (ref 12.0–15.0)
MCH: 31.8 pg (ref 26.0–34.0)
MCHC: 35.9 g/dL (ref 30.0–36.0)
MCV: 88.5 fL (ref 78.0–100.0)
Platelets: 219 10*3/uL (ref 150–400)
RBC: 4.34 MIL/uL (ref 3.87–5.11)
RDW: 13 % (ref 11.5–15.5)
WBC: 5.6 10*3/uL (ref 4.0–10.5)

## 2011-12-14 LAB — PROTIME-INR
INR: 0.91 (ref 0.00–1.49)
Prothrombin Time: 12.4 seconds (ref 11.6–15.2)

## 2011-12-14 LAB — APTT: aPTT: 31 seconds (ref 24–37)

## 2011-12-14 LAB — COMPREHENSIVE METABOLIC PANEL
ALT: 14 U/L (ref 0–35)
AST: 23 U/L (ref 0–37)
Albumin: 3.8 g/dL (ref 3.5–5.2)
Alkaline Phosphatase: 70 U/L (ref 39–117)
BUN: 7 mg/dL (ref 6–23)
CO2: 28 mEq/L (ref 19–32)
Calcium: 9.1 mg/dL (ref 8.4–10.5)
Chloride: 93 mEq/L — ABNORMAL LOW (ref 96–112)
Creatinine, Ser: 0.59 mg/dL (ref 0.50–1.10)
GFR calc Af Amer: >90 mL/min (ref >90)
GFR calc non Af Amer: >90 mL/min (ref >90)
Glucose, Bld: 72 mg/dL (ref 70–99)
Potassium: 3.8 mEq/L (ref 3.5–5.1)
Sodium: 128 mEq/L — ABNORMAL LOW (ref 135–145)
Total Bilirubin: 0.4 mg/dL (ref 0.3–1.2)
Total Protein: 7 g/dL (ref 6.0–8.3)

## 2011-12-14 MED ORDER — FENTANYL CITRATE 0.05 MG/ML IJ SOLN
INTRAMUSCULAR | Status: AC
  Filled 2011-12-14: qty 4

## 2011-12-14 MED ORDER — HYDROMORPHONE HCL PF 1 MG/ML IJ SOLN
INTRAMUSCULAR | Status: AC
  Administered 2011-12-14: 1 mg via INTRAVENOUS
  Filled 2011-12-14: qty 2

## 2011-12-14 MED ORDER — SODIUM CHLORIDE 0.9 % IV SOLN: INTRAVENOUS | Status: AC

## 2011-12-14 MED ORDER — SODIUM CHLORIDE 0.9 % IV SOLN
Freq: Once | INTRAVENOUS | Status: AC
  Administered 2011-12-14: 75 mL/h via INTRAVENOUS

## 2011-12-14 MED ORDER — HYDROMORPHONE HCL PF 1 MG/ML IJ SOLN: 1.0000 mg | Freq: Once | INTRAMUSCULAR | Status: DC

## 2011-12-14 MED ORDER — TOBRAMYCIN SULFATE 1.2 G IJ SOLR
INTRAMUSCULAR | Status: AC
  Filled 2011-12-14: qty 1.2

## 2011-12-14 MED ORDER — CEFAZOLIN SODIUM 1-5 GM-% IV SOLN
1.0000 g | Freq: Once | INTRAVENOUS | Status: AC
  Administered 2011-12-14: 1 g via INTRAVENOUS

## 2011-12-14 MED ORDER — MIDAZOLAM HCL 5 MG/5ML IJ SOLN
INTRAMUSCULAR | Status: AC | PRN
  Administered 2011-12-14 (×5): 1 mg via INTRAVENOUS

## 2011-12-14 MED ORDER — FENTANYL CITRATE 0.05 MG/ML IJ SOLN
INTRAMUSCULAR | Status: AC | PRN
  Administered 2011-12-14 (×5): 25 ug via INTRAVENOUS

## 2011-12-14 MED ORDER — MIDAZOLAM HCL 2 MG/2ML IJ SOLN
INTRAMUSCULAR | Status: AC
  Filled 2011-12-14: qty 6

## 2011-12-14 MED ORDER — CEFAZOLIN SODIUM 1-5 GM-% IV SOLN
INTRAVENOUS | Status: AC
  Filled 2011-12-14: qty 50

## 2011-12-14 NOTE — H&P (Signed)
Pam Cameron is an 66 y.o. female.   Chief Complaint: back pain; T11; T12; L3 fractures HPI: scheduled today for Vertebroplasty vs Kyohoplasty  Past Medical History  Diagnosis Date  . Hypertension   . Anemia   . Pneumonia   . Depression   . Blood transfusion     Past Surgical History  Procedure Date  . Appendectomy   . Tonsillectomy   . Lung surgery     fluid removed after pneumonia    Family History  Problem Relation Age of Onset  . Ovarian cancer Mother   . Prostate cancer Father   . Hypertension Father   . Hypertension Mother    Social History:  reports that she has been smoking.  She has never used smokeless tobacco. She reports that she drinks about 1.5 ounces of alcohol per week. She reports that she does not use illicit drugs.  Allergies:  Allergies  Allergen Reactions  . Codeine     Abdominal cramping    Medications Prior to Admission  Medication Sig Dispense Refill  . BIOTIN PO Take by mouth daily.        . flintstones complete (FLINTSTONES) 60 MG chewable tablet Chew 1 tablet by mouth daily.        . IRON PO Take 1 tablet by mouth daily.      Marland Kitchen lisinopril-hydrochlorothiazide (PRINZIDE,ZESTORETIC) 20-12.5 MG per tablet Take 1 tablet by mouth daily.  30 tablet  2  . pantoprazole (PROTONIX) 40 MG tablet Take 1 tablet (40 mg total) by mouth daily.  30 tablet  5  . amoxicillin-clarithromycin-lansoprazole (PREVPAC) combo pack Follow package directions for 14 days of therapy.  1 kit  0   Medications Prior to Admission  Medication Dose Route Frequency Provider Last Rate Last Dose  . 0.9 %  sodium chloride infusion   Intravenous Once Robet Leu, PA      . ceFAZolin (ANCEF) IVPB 1 g/50 mL premix  1 g Intravenous Once Robet Leu, PA        Results for orders placed during the hospital encounter of 12/14/11 (from the past 48 hour(s))  APTT     Status: Normal   Collection Time   12/14/11  6:54 AM      Component Value Range Comment   aPTT 31  24 - 37  (seconds)   CBC     Status: Normal   Collection Time   12/14/11  6:54 AM      Component Value Range Comment   WBC 5.6  4.0 - 10.5 (K/uL)    RBC 4.34  3.87 - 5.11 (MIL/uL)    Hemoglobin 13.8  12.0 - 15.0 (g/dL)    HCT 21.3  08.6 - 57.8 (%)    MCV 88.5  78.0 - 100.0 (fL)    MCH 31.8  26.0 - 34.0 (pg)    MCHC 35.9  30.0 - 36.0 (g/dL)    RDW 46.9  62.9 - 52.8 (%)    Platelets 219  150 - 400 (K/uL)   PROTIME-INR     Status: Normal   Collection Time   12/14/11  6:54 AM      Component Value Range Comment   Prothrombin Time 12.4  11.6 - 15.2 (seconds)    INR 0.91  0.00 - 1.49     No results found.  Review of Systems  Constitutional: Negative for fever.  Respiratory: Negative for cough.   Cardiovascular: Negative for chest pain.  Gastrointestinal: Negative for nausea  and vomiting.  Neurological: Negative for headaches.    Blood pressure 125/70, pulse 75, temperature 96.2 F (35.7 C), temperature source Oral, resp. rate 18, height 5\' 3"  (1.6 m), weight 150 lb (68.04 kg), SpO2 98.00%. Physical Exam  Constitutional: She is oriented to person, place, and time. She appears well-developed and well-nourished.  HENT:  Head: Normocephalic.  Eyes: EOM are normal.  Neck: Normal range of motion.  Cardiovascular: Normal rate, regular rhythm and normal heart sounds.   No murmur heard. Respiratory: Effort normal and breath sounds normal. She has no wheezes.  GI: Soft. Bowel sounds are normal. There is no tenderness.  Musculoskeletal: Normal range of motion.  Neurological: She is alert and oriented to person, place, and time.  Skin: Skin is warm and dry.     Assessment/Plan T11; T12; L3 fractures. Pt scheduled for VP vs KP of these fractures Aware of procedure benefits and risks and agreeable to proceed. Consent signed.  Pam Cameron 12/14/2011, 7:33 AM

## 2011-12-14 NOTE — Procedures (Signed)
S/P KP at L# and VP T11 and T 12

## 2011-12-17 ENCOUNTER — Telehealth (HOSPITAL_COMMUNITY): Payer: Self-pay | Admitting: Radiology

## 2011-12-20 ENCOUNTER — Other Ambulatory Visit (HOSPITAL_COMMUNITY): Payer: Self-pay | Admitting: Interventional Radiology

## 2011-12-20 DIAGNOSIS — IMO0002 Reserved for concepts with insufficient information to code with codable children: Secondary | ICD-10-CM

## 2011-12-27 ENCOUNTER — Telehealth: Payer: Self-pay | Admitting: Family Medicine

## 2011-12-27 DIAGNOSIS — Z9189 Other specified personal risk factors, not elsewhere classified: Secondary | ICD-10-CM

## 2011-12-27 NOTE — Telephone Encounter (Signed)
Patient has requested that a Bone Density Test be done when she comes in for her CPE 6.17.13. Can you please review her appointment & advise on how I should schedule this  Thanks for the help

## 2011-12-27 NOTE — Telephone Encounter (Signed)
Please advise      KP 

## 2011-12-27 NOTE — Telephone Encounter (Signed)
Ok to put in order

## 2011-12-28 ENCOUNTER — Ambulatory Visit (HOSPITAL_COMMUNITY)
Admission: RE | Admit: 2011-12-28 | Discharge: 2011-12-28 | Disposition: A | Discharge status: Home / Self Care | Payer: 59 | Source: Ambulatory Visit | Attending: Interventional Radiology | Admitting: Interventional Radiology

## 2011-12-28 DIAGNOSIS — IMO0002 Reserved for concepts with insufficient information to code with codable children: Secondary | ICD-10-CM

## 2012-01-09 ENCOUNTER — Telehealth: Payer: Self-pay | Admitting: *Deleted

## 2012-01-09 NOTE — Telephone Encounter (Signed)
Message copied by Florene Glen on Wed Jan 09, 2012  2:44 PM ------      Message from: Florene Glen      Created: Mon Nov 12, 2011  8:47 AM       Call to schedule f/u post h. pylori

## 2012-01-10 NOTE — Telephone Encounter (Signed)
I have tried several times to reach pt. She has no answering machine and has left work.

## 2012-01-11 NOTE — Telephone Encounter (Signed)
Attempted to reach pt by phone again today with no luck. Mailed pt a letter asking her to contact us.

## 2012-01-22 ENCOUNTER — Telehealth: Payer: Self-pay | Admitting: *Deleted

## 2012-01-22 DIAGNOSIS — Z8619 Personal history of other infectious and parasitic diseases: Secondary | ICD-10-CM

## 2012-01-22 NOTE — Telephone Encounter (Signed)
Informed pt she may get the stool test for H. Pylori eradication and we do not have to see her unless she has other problems.

## 2012-01-22 NOTE — Telephone Encounter (Signed)
Message copied by Florene Glen on Tue Jan 22, 2012  1:54 PM ------      Message from: Beverley Fiedler      Created: Mon Jan 21, 2012  1:30 PM       Stool antigen is fine to confirm eradication      thanks      ----- Message -----         From: Linna Hoff, RN         Sent: 01/18/2012   3:06 PM           To: Beverley Fiedler, MD            Have had trouble getting up with pt; she had surgery since her EGD, etc. She did complete her prev pak and you wanted a f/u, but pt has been out so much, could she have a stool test instead of OV for H. Pylori erdication? Thanks.

## 2012-01-24 ENCOUNTER — Telehealth: Payer: Self-pay | Admitting: Internal Medicine

## 2012-01-24 NOTE — Telephone Encounter (Signed)
Notified pt we cannot mail her the stool sample ; she may stop by our lab any week day from 7:30am to 5:30pm and pick up the specimen container and bring it back at her convenience. Pt stated understanding.

## 2012-02-04 ENCOUNTER — Other Ambulatory Visit: Payer: 59

## 2012-02-04 DIAGNOSIS — Z8619 Personal history of other infectious and parasitic diseases: Secondary | ICD-10-CM

## 2012-02-05 ENCOUNTER — Encounter: Payer: Self-pay | Admitting: Internal Medicine

## 2012-02-05 LAB — HELICOBACTER PYLORI  SPECIAL ANTIGEN: H. PYLORI Antigen: NEGATIVE

## 2012-02-25 ENCOUNTER — Encounter: Payer: 59 | Admitting: Family Medicine

## 2012-02-28 ENCOUNTER — Other Ambulatory Visit: Payer: Self-pay | Admitting: Family Medicine

## 2012-03-15 LAB — HM DEXA SCAN

## 2012-03-24 ENCOUNTER — Encounter: Payer: 59 | Admitting: Family Medicine

## 2012-03-24 ENCOUNTER — Encounter: Payer: Self-pay | Admitting: Family Medicine

## 2012-03-24 ENCOUNTER — Ambulatory Visit (INDEPENDENT_AMBULATORY_CARE_PROVIDER_SITE_OTHER): Payer: 59 | Admitting: Family Medicine

## 2012-03-24 ENCOUNTER — Other Ambulatory Visit (HOSPITAL_COMMUNITY)
Admission: RE | Admit: 2012-03-24 | Discharge: 2012-03-24 | Disposition: A | Discharge status: Home / Self Care | Payer: 59 | Source: Ambulatory Visit | Attending: Family Medicine | Admitting: Family Medicine

## 2012-03-24 VITALS — BP 104/68 | HR 77 | Temp 98.4°F | Ht 60.75 in | Wt 147.2 lb

## 2012-03-24 DIAGNOSIS — M810 Age-related osteoporosis without current pathological fracture: Secondary | ICD-10-CM

## 2012-03-24 DIAGNOSIS — Z2911 Encounter for prophylactic immunotherapy for respiratory syncytial virus (RSV): Secondary | ICD-10-CM

## 2012-03-24 DIAGNOSIS — I10 Essential (primary) hypertension: Secondary | ICD-10-CM

## 2012-03-24 DIAGNOSIS — Z124 Encounter for screening for malignant neoplasm of cervix: Secondary | ICD-10-CM

## 2012-03-24 DIAGNOSIS — Z23 Encounter for immunization: Secondary | ICD-10-CM

## 2012-03-24 DIAGNOSIS — F17200 Nicotine dependence, unspecified, uncomplicated: Secondary | ICD-10-CM | POA: Insufficient documentation

## 2012-03-24 DIAGNOSIS — Z0000 Encounter for general adult medical examination without abnormal findings: Secondary | ICD-10-CM

## 2012-03-24 DIAGNOSIS — Z720 Tobacco use: Secondary | ICD-10-CM | POA: Insufficient documentation

## 2012-03-24 DIAGNOSIS — Z01419 Encounter for gynecological examination (general) (routine) without abnormal findings: Secondary | ICD-10-CM | POA: Insufficient documentation

## 2012-03-24 LAB — POCT URINALYSIS DIPSTICK
Bilirubin, UA: NEGATIVE
Blood, UA: NEGATIVE
Glucose, UA: NEGATIVE
Ketones, UA: NEGATIVE
Leukocytes, UA: NEGATIVE
Nitrite, UA: NEGATIVE
Protein, UA: NEGATIVE
Spec Grav, UA: <=1.005
Urobilinogen, UA: 0.2
pH, UA: 7

## 2012-03-24 LAB — CBC WITH DIFFERENTIAL/PLATELET
Basophils Absolute: 0 10*3/uL (ref 0.0–0.1)
Basophils Relative: 0.9 % (ref 0.0–3.0)
Eosinophils Absolute: 0.1 10*3/uL (ref 0.0–0.7)
Eosinophils Relative: 2.4 % (ref 0.0–5.0)
HCT: 38.3 % (ref 36.0–46.0)
Hemoglobin: 13 g/dL (ref 12.0–15.0)
Lymphocytes Relative: 27.8 % (ref 12.0–46.0)
Lymphs Abs: 1.4 10*3/uL (ref 0.7–4.0)
MCHC: 33.9 g/dL (ref 30.0–36.0)
MCV: 96.8 fl (ref 78.0–100.0)
Monocytes Absolute: 0.3 10*3/uL (ref 0.1–1.0)
Monocytes Relative: 6 % (ref 3.0–12.0)
Neutro Abs: 3.2 10*3/uL (ref 1.4–7.7)
Neutrophils Relative %: 62.9 % (ref 43.0–77.0)
Platelets: 224 10*3/uL (ref 150.0–400.0)
RBC: 3.96 Mil/uL (ref 3.87–5.11)
RDW: 14.8 % — ABNORMAL HIGH (ref 11.5–14.6)
WBC: 5.1 10*3/uL (ref 4.5–10.5)

## 2012-03-24 LAB — HEPATIC FUNCTION PANEL
ALT: 17 U/L (ref 0–35)
AST: 26 U/L (ref 0–37)
Albumin: 4.1 g/dL (ref 3.5–5.2)
Alkaline Phosphatase: 57 U/L (ref 39–117)
Bilirubin, Direct: 0.1 mg/dL (ref 0.0–0.3)
Total Bilirubin: 0.7 mg/dL (ref 0.3–1.2)
Total Protein: 6.9 g/dL (ref 6.0–8.3)

## 2012-03-24 LAB — BASIC METABOLIC PANEL
BUN: 7 mg/dL (ref 6–23)
CO2: 28 mEq/L (ref 19–32)
Calcium: 8.6 mg/dL (ref 8.4–10.5)
Chloride: 92 mEq/L — ABNORMAL LOW (ref 96–112)
Creatinine, Ser: 0.5 mg/dL (ref 0.4–1.2)
GFR: 122.97 mL/min (ref >60.00)
Glucose, Bld: 85 mg/dL (ref 70–99)
Potassium: 3.7 mEq/L (ref 3.5–5.1)
Sodium: 127 mEq/L — ABNORMAL LOW (ref 135–145)

## 2012-03-24 LAB — LIPID PANEL
Cholesterol: 174 mg/dL (ref 0–200)
HDL: 80.4 mg/dL (ref >39.00)
LDL Cholesterol: 82 mg/dL (ref 0–99)
Total CHOL/HDL Ratio: 2
Triglycerides: 59 mg/dL (ref 0.0–149.0)
VLDL: 11.8 mg/dL (ref 0.0–40.0)

## 2012-03-24 LAB — TSH: TSH: 0.77 u[IU]/mL (ref 0.35–5.50)

## 2012-03-24 MED ORDER — NICOTINE 10 MG IN INHA: 1.0000 | RESPIRATORY_TRACT | Status: AC | PRN

## 2012-03-24 MED ORDER — LISINOPRIL-HYDROCHLOROTHIAZIDE 20-12.5 MG PO TABS: 1.0000 | ORAL_TABLET | Freq: Every day | ORAL | Status: DC

## 2012-03-24 MED ORDER — ALENDRONATE SODIUM 70 MG PO TABS: 70.0000 mg | ORAL_TABLET | ORAL | Status: DC

## 2012-03-24 NOTE — Assessment & Plan Note (Signed)
Stable con't meds 

## 2012-03-24 NOTE — Addendum Note (Signed)
Addended by: Lelon Perla on: 03/24/2012 01:26 PM   Modules accepted: Orders

## 2012-03-24 NOTE — Assessment & Plan Note (Signed)
Fosamax 70 mg  Weekly  #4  11 refills Recheck bmd in 2 years

## 2012-03-24 NOTE — Patient Instructions (Addendum)
Preventive Care for Adults, Female A healthy lifestyle and preventive care can promote health and wellness. Preventive health guidelines for women include the following key practices.  A routine yearly physical is a good way to check with your caregiver about your health and preventive screening. It is a chance to share any concerns and updates on your health, and to receive a thorough exam.   Visit your dentist for a routine exam and preventive care every 6 months. Brush your teeth twice a day and floss once a day. Good oral hygiene prevents tooth decay and gum disease.   The frequency of eye exams is based on your age, health, family medical history, use of contact lenses, and other factors. Follow your caregiver's recommendations for frequency of eye exams.   Eat a healthy diet. Foods like vegetables, fruits, whole grains, low-fat dairy products, and lean protein foods contain the nutrients you need without too many calories. Decrease your intake of foods high in solid fats, added sugars, and salt. Eat the right amount of calories for you.Get information about a proper diet from your caregiver, if necessary.   Regular physical exercise is one of the most important things you can do for your health. Most adults should get at least 150 minutes of moderate-intensity exercise (any activity that increases your heart rate and causes you to sweat) each week. In addition, most adults need muscle-strengthening exercises on 2 or more days a week.   Maintain a healthy weight. The body mass index (BMI) is a screening tool to identify possible weight problems. It provides an estimate of body fat based on height and weight. Your caregiver can help determine your BMI, and can help you achieve or maintain a healthy weight.For adults 20 years and older:   A BMI below 18.5 is considered underweight.   A BMI of 18.5 to 24.9 is normal.   A BMI of 25 to 29.9 is considered overweight.   A BMI of 30 and above is  considered obese.   Maintain normal blood lipids and cholesterol levels by exercising and minimizing your intake of saturated fat. Eat a balanced diet with plenty of fruit and vegetables. Blood tests for lipids and cholesterol should begin at age 20 and be repeated every 5 years. If your lipid or cholesterol levels are high, you are over 50, or you are at high risk for heart disease, you may need your cholesterol levels checked more frequently.Ongoing high lipid and cholesterol levels should be treated with medicines if diet and exercise are not effective.   If you smoke, find out from your caregiver how to quit. If you do not use tobacco, do not start.   If you are pregnant, do not drink alcohol. If you are breastfeeding, be very cautious about drinking alcohol. If you are not pregnant and choose to drink alcohol, do not exceed 1 drink per day. One drink is considered to be 12 ounces (355 mL) of beer, 5 ounces (148 mL) of wine, or 1.5 ounces (44 mL) of liquor.   Avoid use of street drugs. Do not share needles with anyone. Ask for help if you need support or instructions about stopping the use of drugs.   High blood pressure causes heart disease and increases the risk of stroke. Your blood pressure should be checked at least every 1 to 2 years. Ongoing high blood pressure should be treated with medicines if weight loss and exercise are not effective.   If you are 55 to 65   years old, ask your caregiver if you should take aspirin to prevent strokes.   Diabetes screening involves taking a blood sample to check your fasting blood sugar level. This should be done once every 3 years, after age 45, if you are within normal weight and without risk factors for diabetes. Testing should be considered at a younger age or be carried out more frequently if you are overweight and have at least 1 risk factor for diabetes.   Breast cancer screening is essential preventive care for women. You should practice "breast  self-awareness." This means understanding the normal appearance and feel of your breasts and may include breast self-examination. Any changes detected, no matter how small, should be reported to a caregiver. Women in their 20s and 30s should have a clinical breast exam (CBE) by a caregiver as part of a regular health exam every 1 to 3 years. After age 40, women should have a CBE every year. Starting at age 40, women should consider having a mammography (breast X-ray test) every year. Women who have a family history of breast cancer should talk to their caregiver about genetic screening. Women at a high risk of breast cancer should talk to their caregivers about having magnetic resonance imaging (MRI) and a mammography every year.   The Pap test is a screening test for cervical cancer. A Pap test can show cell changes on the cervix that might become cervical cancer if left untreated. A Pap test is a procedure in which cells are obtained and examined from the lower end of the uterus (cervix).   Women should have a Pap test starting at age 21.   Between ages 21 and 29, Pap tests should be repeated every 2 years.   Beginning at age 30, you should have a Pap test every 3 years as long as the past 3 Pap tests have been normal.   Some women have medical problems that increase the chance of getting cervical cancer. Talk to your caregiver about these problems. It is especially important to talk to your caregiver if a new problem develops soon after your last Pap test. In these cases, your caregiver may recommend more frequent screening and Pap tests.   The above recommendations are the same for women who have or have not gotten the vaccine for human papillomavirus (HPV).   If you had a hysterectomy for a problem that was not cancer or a condition that could lead to cancer, then you no longer need Pap tests. Even if you no longer need a Pap test, a regular exam is a good idea to make sure no other problems are  starting.   If you are between ages 65 and 70, and you have had normal Pap tests going back 10 years, you no longer need Pap tests. Even if you no longer need a Pap test, a regular exam is a good idea to make sure no other problems are starting.   If you have had past treatment for cervical cancer or a condition that could lead to cancer, you need Pap tests and screening for cancer for at least 20 years after your treatment.   If Pap tests have been discontinued, risk factors (such as a new sexual partner) need to be reassessed to determine if screening should be resumed.   The HPV test is an additional test that may be used for cervical cancer screening. The HPV test looks for the virus that can cause the cell changes on the cervix.   The cells collected during the Pap test can be tested for HPV. The HPV test could be used to screen women aged 30 years and older, and should be used in women of any age who have unclear Pap test results. After the age of 30, women should have HPV testing at the same frequency as a Pap test.   Colorectal cancer can be detected and often prevented. Most routine colorectal cancer screening begins at the age of 50 and continues through age 75. However, your caregiver may recommend screening at an earlier age if you have risk factors for colon cancer. On a yearly basis, your caregiver may provide home test kits to check for hidden blood in the stool. Use of a small camera at the end of a tube, to directly examine the colon (sigmoidoscopy or colonoscopy), can detect the earliest forms of colorectal cancer. Talk to your caregiver about this at age 50, when routine screening begins. Direct examination of the colon should be repeated every 5 to 10 years through age 75, unless early forms of pre-cancerous polyps or small growths are found.   Hepatitis C blood testing is recommended for all people born from 1945 through 1965 and any individual with known risks for hepatitis C.    Practice safe sex. Use condoms and avoid high-risk sexual practices to reduce the spread of sexually transmitted infections (STIs). STIs include gonorrhea, chlamydia, syphilis, trichomonas, herpes, HPV, and human immunodeficiency virus (HIV). Herpes, HIV, and HPV are viral illnesses that have no cure. They can result in disability, cancer, and death. Sexually active women aged 25 and younger should be checked for chlamydia. Older women with new or multiple partners should also be tested for chlamydia. Testing for other STIs is recommended if you are sexually active and at increased risk.   Osteoporosis is a disease in which the bones lose minerals and strength with aging. This can result in serious bone fractures. The risk of osteoporosis can be identified using a bone density scan. Women ages 65 and over and women at risk for fractures or osteoporosis should discuss screening with their caregivers. Ask your caregiver whether you should take a calcium supplement or vitamin D to reduce the rate of osteoporosis.   Menopause can be associated with physical symptoms and risks. Hormone replacement therapy is available to decrease symptoms and risks. You should talk to your caregiver about whether hormone replacement therapy is right for you.   Use sunscreen with sun protection factor (SPF) of 30 or more. Apply sunscreen liberally and repeatedly throughout the day. You should seek shade when your shadow is shorter than you. Protect yourself by wearing long sleeves, pants, a wide-brimmed hat, and sunglasses year round, whenever you are outdoors.   Once a month, do a whole body skin exam, using a mirror to look at the skin on your back. Notify your caregiver of new moles, moles that have irregular borders, moles that are larger than a pencil eraser, or moles that have changed in shape or color.   Stay current with required immunizations.   Influenza. You need a dose every fall (or winter). The composition of  the flu vaccine changes each year, so being vaccinated once is not enough.   Pneumococcal polysaccharide. You need 1 to 2 doses if you smoke cigarettes or if you have certain chronic medical conditions. You need 1 dose at age 65 (or older) if you have never been vaccinated.   Tetanus, diphtheria, pertussis (Tdap, Td). Get 1 dose of   Tdap vaccine if you are younger than age 65, are over 65 and have contact with an infant, are a healthcare worker, are pregnant, or simply want to be protected from whooping cough. After that, you need a Td booster dose every 10 years. Consult your caregiver if you have not had at least 3 tetanus and diphtheria-containing shots sometime in your life or have a deep or dirty wound.   HPV. You need this vaccine if you are a woman age 26 or younger. The vaccine is given in 3 doses over 6 months.   Measles, mumps, rubella (MMR). You need at least 1 dose of MMR if you were born in 1957 or later. You may also need a second dose.   Meningococcal. If you are age 19 to 21 and a first-year college student living in a residence hall, or have one of several medical conditions, you need to get vaccinated against meningococcal disease. You may also need additional booster doses.   Zoster (shingles). If you are age 60 or older, you should get this vaccine.   Varicella (chickenpox). If you have never had chickenpox or you were vaccinated but received only 1 dose, talk to your caregiver to find out if you need this vaccine.   Hepatitis A. You need this vaccine if you have a specific risk factor for hepatitis A virus infection or you simply wish to be protected from this disease. The vaccine is usually given as 2 doses, 6 to 18 months apart.   Hepatitis B. You need this vaccine if you have a specific risk factor for hepatitis B virus infection or you simply wish to be protected from this disease. The vaccine is given in 3 doses, usually over 6 months.  Preventive Services /  Frequency Ages 19 to 39  Blood pressure check.** / Every 1 to 2 years.   Lipid and cholesterol check.** / Every 5 years beginning at age 20.   Clinical breast exam.** / Every 3 years for women in their 20s and 30s.   Pap test.** / Every 2 years from ages 21 through 29. Every 3 years starting at age 30 through age 65 or 70 with a history of 3 consecutive normal Pap tests.   HPV screening.** / Every 3 years from ages 30 through ages 65 to 70 with a history of 3 consecutive normal Pap tests.   Hepatitis C blood test.** / For any individual with known risks for hepatitis C.   Skin self-exam. / Monthly.   Influenza immunization.** / Every year.   Pneumococcal polysaccharide immunization.** / 1 to 2 doses if you smoke cigarettes or if you have certain chronic medical conditions.   Tetanus, diphtheria, pertussis (Tdap, Td) immunization. / A one-time dose of Tdap vaccine. After that, you need a Td booster dose every 10 years.   HPV immunization. / 3 doses over 6 months, if you are 26 and younger.   Measles, mumps, rubella (MMR) immunization. / You need at least 1 dose of MMR if you were born in 1957 or later. You may also need a second dose.   Meningococcal immunization. / 1 dose if you are age 19 to 21 and a first-year college student living in a residence hall, or have one of several medical conditions, you need to get vaccinated against meningococcal disease. You may also need additional booster doses.   Varicella immunization.** / Consult your caregiver.   Hepatitis A immunization.** / Consult your caregiver. 2 doses, 6 to 18 months   apart.   Hepatitis B immunization.** / Consult your caregiver. 3 doses usually over 6 months.  Ages 40 to 64  Blood pressure check.** / Every 1 to 2 years.   Lipid and cholesterol check.** / Every 5 years beginning at age 20.   Clinical breast exam.** / Every year after age 40.   Mammogram.** / Every year beginning at age 40 and continuing for as  long as you are in good health. Consult with your caregiver.   Pap test.** / Every 3 years starting at age 30 through age 65 or 70 with a history of 3 consecutive normal Pap tests.   HPV screening.** / Every 3 years from ages 30 through ages 65 to 70 with a history of 3 consecutive normal Pap tests.   Fecal occult blood test (FOBT) of stool. / Every year beginning at age 50 and continuing until age 75. You may not need to do this test if you get a colonoscopy every 10 years.   Flexible sigmoidoscopy or colonoscopy.** / Every 5 years for a flexible sigmoidoscopy or every 10 years for a colonoscopy beginning at age 50 and continuing until age 75.   Hepatitis C blood test.** / For all people born from 1945 through 1965 and any individual with known risks for hepatitis C.   Skin self-exam. / Monthly.   Influenza immunization.** / Every year.   Pneumococcal polysaccharide immunization.** / 1 to 2 doses if you smoke cigarettes or if you have certain chronic medical conditions.   Tetanus, diphtheria, pertussis (Tdap, Td) immunization.** / A one-time dose of Tdap vaccine. After that, you need a Td booster dose every 10 years.   Measles, mumps, rubella (MMR) immunization. / You need at least 1 dose of MMR if you were born in 1957 or later. You may also need a second dose.   Varicella immunization.** / Consult your caregiver.   Meningococcal immunization.** / Consult your caregiver.   Hepatitis A immunization.** / Consult your caregiver. 2 doses, 6 to 18 months apart.   Hepatitis B immunization.** / Consult your caregiver. 3 doses, usually over 6 months.  Ages 65 and over  Blood pressure check.** / Every 1 to 2 years.   Lipid and cholesterol check.** / Every 5 years beginning at age 20.   Clinical breast exam.** / Every year after age 40.   Mammogram.** / Every year beginning at age 40 and continuing for as long as you are in good health. Consult with your caregiver.   Pap test.** /  Every 3 years starting at age 30 through age 65 or 70 with a 3 consecutive normal Pap tests. Testing can be stopped between 65 and 70 with 3 consecutive normal Pap tests and no abnormal Pap or HPV tests in the past 10 years.   HPV screening.** / Every 3 years from ages 30 through ages 65 or 70 with a history of 3 consecutive normal Pap tests. Testing can be stopped between 65 and 70 with 3 consecutive normal Pap tests and no abnormal Pap or HPV tests in the past 10 years.   Fecal occult blood test (FOBT) of stool. / Every year beginning at age 50 and continuing until age 75. You may not need to do this test if you get a colonoscopy every 10 years.   Flexible sigmoidoscopy or colonoscopy.** / Every 5 years for a flexible sigmoidoscopy or every 10 years for a colonoscopy beginning at age 50 and continuing until age 75.   Hepatitis   C blood test.** / For all people born from 1945 through 1965 and any individual with known risks for hepatitis C.   Osteoporosis screening.** / A one-time screening for women ages 65 and over and women at risk for fractures or osteoporosis.   Skin self-exam. / Monthly.   Influenza immunization.** / Every year.   Pneumococcal polysaccharide immunization.** / 1 dose at age 65 (or older) if you have never been vaccinated.   Tetanus, diphtheria, pertussis (Tdap, Td) immunization. / A one-time dose of Tdap vaccine if you are over 65 and have contact with an infant, are a healthcare worker, or simply want to be protected from whooping cough. After that, you need a Td booster dose every 10 years.   Varicella immunization.** / Consult your caregiver.   Meningococcal immunization.** / Consult your caregiver.   Hepatitis A immunization.** / Consult your caregiver. 2 doses, 6 to 18 months apart.   Hepatitis B immunization.** / Check with your caregiver. 3 doses, usually over 6 months.  ** Family history and personal history of risk and conditions may change your caregiver's  recommendations. Document Released: 11/20/2001 Document Revised: 09/13/2011 Document Reviewed: 02/19/2011 [employer] Patient Information 2012 [employer]. 

## 2012-03-24 NOTE — Progress Notes (Signed)
Subjective:     Pam Cameron is a 66 y.o. female and is here for a comprehensive physical exam. The patient reports no problems.  History   Social History  . Marital Status: Single    Spouse Name: N/A    Number of Children: 0  . Years of Education: N/A   Occupational History  . sales    Social History Main Topics  . Smoking status: Current Everyday Smoker -- 1.5 packs/day for 40 years  . Smokeless tobacco: Never Used  . Alcohol Use: 1.5 oz/week    3 drink(s) per week     moderate  . Drug Use: No  . Sexually Active: Yes -- Female partner(s)   Other Topics Concern  . Not on file   Social History Narrative   Exercise--no   Health Maintenance  Topic Date Due  . Zostavax  01/08/2007  . Pneumococcal Polysaccharide Vaccine Age 2 And Over  01/08/2012  . Influenza Vaccine  07/08/2012  . Mammogram  06/01/2013  . Colonoscopy  10/09/2021  . Tetanus/tdap  10/09/2021    The following portions of the patient's history were reviewed and updated as appropriate: allergies, current medications, past family history, past medical history, past social history, past surgical history and problem list.  Review of Systems Review of Systems  Constitutional: Negative for activity change, appetite change and fatigue.  HENT: Negative for hearing loss, congestion, tinnitus and ear discharge.  dentist--due Eyes: Negative for visual disturbance (see optho--- due)  Respiratory: Negative for cough, chest tightness and shortness of breath.   Cardiovascular: Negative for chest pain, palpitations and leg swelling.  Gastrointestinal: Negative for abdominal pain, diarrhea, constipation and abdominal distention.  Genitourinary: Negative for urgency, frequency, decreased urine volume and difficulty urinating.  Musculoskeletal: Negative for back pain, arthralgias and gait problem.  Skin: Negative for color change, pallor and rash.  Neurological: Negative for dizziness, light-headedness, numbness and headaches.    Hematological: Negative for adenopathy. Does not bruise/bleed easily.  Psychiatric/Behavioral: Negative for suicidal ideas, confusion, sleep disturbance, self-injury, dysphoric mood, decreased concentration and agitation.       Objective:    BP 104/68  Pulse 77  Temp 98.4 F (36.9 C) (Oral)  Ht 5' 0.75" (1.543 m)  Wt 147 lb 3.2 oz (66.769 kg)  BMI 28.04 kg/m2  SpO2 97% General appearance: alert, cooperative, appears stated age and no distress Head: Normocephalic, without obvious abnormality, atraumatic Eyes: conjunctivae/corneas clear. PERRL, EOM's intact. Fundi benign. Ears: normal TM's and external ear canals both ears Nose: Nares normal. Septum midline. Mucosa normal. No drainage or sinus tenderness. Throat: lips, mucosa, and tongue normal; teeth and gums normal Neck: no adenopathy, no carotid bruit, no JVD, supple, symmetrical, trachea midline and thyroid not enlarged, symmetric, no tenderness/mass/nodules Back: symmetric, no curvature. ROM normal. No CVA tenderness. Lungs: occassional wheeze Breasts: normal appearance, no masses or tenderness Heart: regular rate and rhythm, S1, S2 normal, no murmur, click, rub or gallop Abdomen: soft, non-tender; bowel sounds normal; no masses,  no organomegaly Pelvic: cervix normal in appearance, external genitalia normal, no adnexal masses or tenderness, no cervical motion tenderness, rectovaginal septum normal, uterus normal size, shape, and consistency and vagina normal without discharge Extremities: extremities normal, atraumatic, no cyanosis or edema Pulses: 2+ and symmetric Skin: Skin color, texture, turgor normal. No rashes or lesions Lymph nodes: Cervical, supraclavicular, and axillary nodes normal. Neurologic: Alert and oriented X 3, normal strength and tone. Normal symmetric reflexes. Normal coordination and gait psych-- no depression, no anxiety  Assessment:    Healthy female exam.     Plan:  ghm utd  Check labs   See  After Visit Summary for Counseling Recommendations

## 2012-03-24 NOTE — Assessment & Plan Note (Signed)
Sample of nicotrol inhaler given to pt  

## 2012-07-03 ENCOUNTER — Encounter: Payer: Self-pay | Admitting: Family Medicine

## 2013-02-10 ENCOUNTER — Telehealth: Payer: Self-pay | Admitting: Family Medicine

## 2013-02-10 DIAGNOSIS — M810 Age-related osteoporosis without current pathological fracture: Secondary | ICD-10-CM

## 2013-02-10 MED ORDER — ALENDRONATE SODIUM 70 MG PO TABS: 70.0000 mg | ORAL_TABLET | ORAL | Status: DC

## 2013-02-10 NOTE — Telephone Encounter (Signed)
Rx for Fosamax sent to see if the insurance company would cover it, I made the patient aware if a PA is needed we will take care of it, she voiced understanding.       KP

## 2013-02-10 NOTE — Telephone Encounter (Signed)
alendronate (FOSAMAX) 70 MG tablet Calling about faxed letter about above rx- faxed form- ??? Kim have you seen anything in regards to this paperwork that Lowne would need to call about.

## 2013-03-22 ENCOUNTER — Other Ambulatory Visit: Payer: Self-pay | Admitting: Family Medicine

## 2013-03-24 ENCOUNTER — Other Ambulatory Visit: Payer: Self-pay | Admitting: Family Medicine

## 2013-03-30 ENCOUNTER — Encounter: Payer: 59 | Admitting: Family Medicine

## 2013-04-15 ENCOUNTER — Encounter: Payer: Self-pay | Admitting: Family Medicine

## 2013-04-15 ENCOUNTER — Ambulatory Visit (INDEPENDENT_AMBULATORY_CARE_PROVIDER_SITE_OTHER): Payer: PRIVATE HEALTH INSURANCE | Admitting: Family Medicine

## 2013-04-15 VITALS — BP 118/72 | HR 73 | Temp 98.5°F | Ht 61.5 in | Wt 147.0 lb

## 2013-04-15 DIAGNOSIS — J441 Chronic obstructive pulmonary disease with (acute) exacerbation: Secondary | ICD-10-CM | POA: Insufficient documentation

## 2013-04-15 DIAGNOSIS — J449 Chronic obstructive pulmonary disease, unspecified: Secondary | ICD-10-CM

## 2013-04-15 DIAGNOSIS — M810 Age-related osteoporosis without current pathological fracture: Secondary | ICD-10-CM

## 2013-04-15 DIAGNOSIS — Z0000 Encounter for general adult medical examination without abnormal findings: Secondary | ICD-10-CM

## 2013-04-15 DIAGNOSIS — I10 Essential (primary) hypertension: Secondary | ICD-10-CM

## 2013-04-15 DIAGNOSIS — J4489 Other specified chronic obstructive pulmonary disease: Secondary | ICD-10-CM

## 2013-04-15 LAB — LIPID PANEL
Cholesterol: 188 mg/dL (ref 0–200)
HDL: 90.9 mg/dL (ref >39.00)
LDL Cholesterol: 85 mg/dL (ref 0–99)
Total CHOL/HDL Ratio: 2
Triglycerides: 62 mg/dL (ref 0.0–149.0)
VLDL: 12.4 mg/dL (ref 0.0–40.0)

## 2013-04-15 LAB — CBC WITH DIFFERENTIAL/PLATELET
Basophils Absolute: 0.1 10*3/uL (ref 0.0–0.1)
Basophils Relative: 1 % (ref 0.0–3.0)
Eosinophils Absolute: 0.1 10*3/uL (ref 0.0–0.7)
Eosinophils Relative: 1.9 % (ref 0.0–5.0)
HCT: 40 % (ref 36.0–46.0)
Hemoglobin: 13.9 g/dL (ref 12.0–15.0)
Lymphocytes Relative: 30.4 % (ref 12.0–46.0)
Lymphs Abs: 1.7 10*3/uL (ref 0.7–4.0)
MCHC: 34.7 g/dL (ref 30.0–36.0)
MCV: 99 fl (ref 78.0–100.0)
Monocytes Absolute: 0.4 10*3/uL (ref 0.1–1.0)
Monocytes Relative: 7.7 % (ref 3.0–12.0)
Neutro Abs: 3.3 10*3/uL (ref 1.4–7.7)
Neutrophils Relative %: 59 % (ref 43.0–77.0)
Platelets: 216 10*3/uL (ref 150.0–400.0)
RBC: 4.04 Mil/uL (ref 3.87–5.11)
RDW: 13.4 % (ref 11.5–14.6)
WBC: 5.6 10*3/uL (ref 4.5–10.5)

## 2013-04-15 LAB — BASIC METABOLIC PANEL
BUN: 5 mg/dL — ABNORMAL LOW (ref 6–23)
CO2: 32 mEq/L (ref 19–32)
Calcium: 8.8 mg/dL (ref 8.4–10.5)
Chloride: 86 mEq/L — ABNORMAL LOW (ref 96–112)
Creatinine, Ser: 0.6 mg/dL (ref 0.4–1.2)
GFR: 112.7 mL/min (ref >60.00)
Glucose, Bld: 83 mg/dL (ref 70–99)
Potassium: 3.7 mEq/L (ref 3.5–5.1)
Sodium: 122 mEq/L — ABNORMAL LOW (ref 135–145)

## 2013-04-15 LAB — POCT URINALYSIS DIPSTICK
Bilirubin, UA: NEGATIVE
Blood, UA: NEGATIVE
Glucose, UA: NEGATIVE
Ketones, UA: NEGATIVE
Leukocytes, UA: NEGATIVE
Nitrite, UA: NEGATIVE
Protein, UA: NEGATIVE
Spec Grav, UA: <=1.005
Urobilinogen, UA: 0.2
pH, UA: 7.5

## 2013-04-15 LAB — HEPATIC FUNCTION PANEL
ALT: 18 U/L (ref 0–35)
AST: 26 U/L (ref 0–37)
Albumin: 4.3 g/dL (ref 3.5–5.2)
Alkaline Phosphatase: 38 U/L — ABNORMAL LOW (ref 39–117)
Bilirubin, Direct: 0.2 mg/dL (ref 0.0–0.3)
Total Bilirubin: 0.8 mg/dL (ref 0.3–1.2)
Total Protein: 6.9 g/dL (ref 6.0–8.3)

## 2013-04-15 LAB — MICROALBUMIN / CREATININE URINE RATIO
Creatinine,U: 37.9 mg/dL
Microalb Creat Ratio: 2.9 mg/g (ref 0.0–30.0)
Microalb, Ur: 1.1 mg/dL (ref 0.0–1.9)

## 2013-04-15 MED ORDER — LISINOPRIL-HYDROCHLOROTHIAZIDE 20-12.5 MG PO TABS: ORAL_TABLET | ORAL | Status: DC

## 2013-04-15 MED ORDER — ALENDRONATE SODIUM 70 MG PO TABS: ORAL_TABLET | ORAL | Status: DC

## 2013-04-15 MED ORDER — FLUTICASONE-SALMETEROL 250-50 MCG/DOSE IN AEPB: INHALATION_SPRAY | RESPIRATORY_TRACT | Status: DC

## 2013-04-15 MED ORDER — ALBUTEROL SULFATE HFA 108 (90 BASE) MCG/ACT IN AERS: 2.0000 | INHALATION_SPRAY | Freq: Four times a day (QID) | RESPIRATORY_TRACT | Status: DC | PRN

## 2013-04-15 NOTE — Patient Instructions (Addendum)
Preventive Care for Adults, Female A healthy lifestyle and preventive care can promote health and wellness. Preventive health guidelines for women include the following key practices.  A routine yearly physical is a good way to check with your caregiver about your health and preventive screening. It is a chance to share any concerns and updates on your health, and to receive a thorough exam.  Visit your dentist for a routine exam and preventive care every 6 months. Brush your teeth twice a day and floss once a day. Good oral hygiene prevents tooth decay and gum disease.  The frequency of eye exams is based on your age, health, family medical history, use of contact lenses, and other factors. Follow your caregiver's recommendations for frequency of eye exams.  Eat a healthy diet. Foods like vegetables, fruits, whole grains, low-fat dairy products, and lean protein foods contain the nutrients you need without too many calories. Decrease your intake of foods high in solid fats, added sugars, and salt. Eat the right amount of calories for you.Get information about a proper diet from your caregiver, if necessary.  Regular physical exercise is one of the most important things you can do for your health. Most adults should get at least 150 minutes of moderate-intensity exercise (any activity that increases your heart rate and causes you to sweat) each week. In addition, most adults need muscle-strengthening exercises on 2 or more days a week.  Maintain a healthy weight. The body mass index (BMI) is a screening tool to identify possible weight problems. It provides an estimate of body fat based on height and weight. Your caregiver can help determine your BMI, and can help you achieve or maintain a healthy weight.For adults 20 years and older:  A BMI below 18.5 is considered underweight.  A BMI of 18.5 to 24.9 is normal.  A BMI of 25 to 29.9 is considered overweight.  A BMI of 30 and above is  considered obese.  Maintain normal blood lipids and cholesterol levels by exercising and minimizing your intake of saturated fat. Eat a balanced diet with plenty of fruit and vegetables. Blood tests for lipids and cholesterol should begin at age 20 and be repeated every 5 years. If your lipid or cholesterol levels are high, you are over 50, or you are at high risk for heart disease, you may need your cholesterol levels checked more frequently.Ongoing high lipid and cholesterol levels should be treated with medicines if diet and exercise are not effective.  If you smoke, find out from your caregiver how to quit. If you do not use tobacco, do not start.  If you are pregnant, do not drink alcohol. If you are breastfeeding, be very cautious about drinking alcohol. If you are not pregnant and choose to drink alcohol, do not exceed 1 drink per day. One drink is considered to be 12 ounces (355 mL) of beer, 5 ounces (148 mL) of wine, or 1.5 ounces (44 mL) of liquor.  Avoid use of street drugs. Do not share needles with anyone. Ask for help if you need support or instructions about stopping the use of drugs.  High blood pressure causes heart disease and increases the risk of stroke. Your blood pressure should be checked at least every 1 to 2 years. Ongoing high blood pressure should be treated with medicines if weight loss and exercise are not effective.  If you are 55 to 66 years old, ask your caregiver if you should take aspirin to prevent strokes.  Diabetes   screening involves taking a blood sample to check your fasting blood sugar level. This should be done once every 3 years, after age 45, if you are within normal weight and without risk factors for diabetes. Testing should be considered at a younger age or be carried out more frequently if you are overweight and have at least 1 risk factor for diabetes.  Breast cancer screening is essential preventive care for women. You should practice "breast  self-awareness." This means understanding the normal appearance and feel of your breasts and may include breast self-examination. Any changes detected, no matter how small, should be reported to a caregiver. Women in their 20s and 30s should have a clinical breast exam (CBE) by a caregiver as part of a regular health exam every 1 to 3 years. After age 40, women should have a CBE every year. Starting at age 40, women should consider having a mammography (breast X-ray test) every year. Women who have a family history of breast cancer should talk to their caregiver about genetic screening. Women at a high risk of breast cancer should talk to their caregivers about having magnetic resonance imaging (MRI) and a mammography every year.  The Pap test is a screening test for cervical cancer. A Pap test can show cell changes on the cervix that might become cervical cancer if left untreated. A Pap test is a procedure in which cells are obtained and examined from the lower end of the uterus (cervix).  Women should have a Pap test starting at age 21.  Between ages 21 and 29, Pap tests should be repeated every 2 years.  Beginning at age 30, you should have a Pap test every 3 years as long as the past 3 Pap tests have been normal.  Some women have medical problems that increase the chance of getting cervical cancer. Talk to your caregiver about these problems. It is especially important to talk to your caregiver if a new problem develops soon after your last Pap test. In these cases, your caregiver may recommend more frequent screening and Pap tests.  The above recommendations are the same for women who have or have not gotten the vaccine for human papillomavirus (HPV).  If you had a hysterectomy for a problem that was not cancer or a condition that could lead to cancer, then you no longer need Pap tests. Even if you no longer need a Pap test, a regular exam is a good idea to make sure no other problems are  starting.  If you are between ages 65 and 70, and you have had normal Pap tests going back 10 years, you no longer need Pap tests. Even if you no longer need a Pap test, a regular exam is a good idea to make sure no other problems are starting.  If you have had past treatment for cervical cancer or a condition that could lead to cancer, you need Pap tests and screening for cancer for at least 20 years after your treatment.  If Pap tests have been discontinued, risk factors (such as a new sexual partner) need to be reassessed to determine if screening should be resumed.  The HPV test is an additional test that may be used for cervical cancer screening. The HPV test looks for the virus that can cause the cell changes on the cervix. The cells collected during the Pap test can be tested for HPV. The HPV test could be used to screen women aged 30 years and older, and should   be used in women of any age who have unclear Pap test results. After the age of 30, women should have HPV testing at the same frequency as a Pap test.  Colorectal cancer can be detected and often prevented. Most routine colorectal cancer screening begins at the age of 50 and continues through age 75. However, your caregiver may recommend screening at an earlier age if you have risk factors for colon cancer. On a yearly basis, your caregiver may provide home test kits to check for hidden blood in the stool. Use of a small camera at the end of a tube, to directly examine the colon (sigmoidoscopy or colonoscopy), can detect the earliest forms of colorectal cancer. Talk to your caregiver about this at age 50, when routine screening begins. Direct examination of the colon should be repeated every 5 to 10 years through age 75, unless early forms of pre-cancerous polyps or small growths are found.  Hepatitis C blood testing is recommended for all people born from 1945 through 1965 and any individual with known risks for hepatitis C.  Practice  safe sex. Use condoms and avoid high-risk sexual practices to reduce the spread of sexually transmitted infections (STIs). STIs include gonorrhea, chlamydia, syphilis, trichomonas, herpes, HPV, and human immunodeficiency virus (HIV). Herpes, HIV, and HPV are viral illnesses that have no cure. They can result in disability, cancer, and death. Sexually active women aged 25 and younger should be checked for chlamydia. Older women with new or multiple partners should also be tested for chlamydia. Testing for other STIs is recommended if you are sexually active and at increased risk.  Osteoporosis is a disease in which the bones lose minerals and strength with aging. This can result in serious bone fractures. The risk of osteoporosis can be identified using a bone density scan. Women ages 65 and over and women at risk for fractures or osteoporosis should discuss screening with their caregivers. Ask your caregiver whether you should take a calcium supplement or vitamin D to reduce the rate of osteoporosis.  Menopause can be associated with physical symptoms and risks. Hormone replacement therapy is available to decrease symptoms and risks. You should talk to your caregiver about whether hormone replacement therapy is right for you.  Use sunscreen with sun protection factor (SPF) of 30 or more. Apply sunscreen liberally and repeatedly throughout the day. You should seek shade when your shadow is shorter than you. Protect yourself by wearing long sleeves, pants, a wide-brimmed hat, and sunglasses year round, whenever you are outdoors.  Once a month, do a whole body skin exam, using a mirror to look at the skin on your back. Notify your caregiver of new moles, moles that have irregular borders, moles that are larger than a pencil eraser, or moles that have changed in shape or color.  Stay current with required immunizations.  Influenza. You need a dose every fall (or winter). The composition of the flu vaccine  changes each year, so being vaccinated once is not enough.  Pneumococcal polysaccharide. You need 1 to 2 doses if you smoke cigarettes or if you have certain chronic medical conditions. You need 1 dose at age 65 (or older) if you have never been vaccinated.  Tetanus, diphtheria, pertussis (Tdap, Td). Get 1 dose of Tdap vaccine if you are younger than age 65, are over 65 and have contact with an infant, are a healthcare worker, are pregnant, or simply want to be protected from whooping cough. After that, you need a Td   booster dose every 10 years. Consult your caregiver if you have not had at least 3 tetanus and diphtheria-containing shots sometime in your life or have a deep or dirty wound.  HPV. You need this vaccine if you are a woman age 26 or younger. The vaccine is given in 3 doses over 6 months.  Measles, mumps, rubella (MMR). You need at least 1 dose of MMR if you were born in 1957 or later. You may also need a second dose.  Meningococcal. If you are age 19 to 21 and a first-year college student living in a residence hall, or have one of several medical conditions, you need to get vaccinated against meningococcal disease. You may also need additional booster doses.  Zoster (shingles). If you are age 60 or older, you should get this vaccine.  Varicella (chickenpox). If you have never had chickenpox or you were vaccinated but received only 1 dose, talk to your caregiver to find out if you need this vaccine.  Hepatitis A. You need this vaccine if you have a specific risk factor for hepatitis A virus infection or you simply wish to be protected from this disease. The vaccine is usually given as 2 doses, 6 to 18 months apart.  Hepatitis B. You need this vaccine if you have a specific risk factor for hepatitis B virus infection or you simply wish to be protected from this disease. The vaccine is given in 3 doses, usually over 6 months. Preventive Services / Frequency Ages 19 to 39  Blood  pressure check.** / Every 1 to 2 years.  Lipid and cholesterol check.** / Every 5 years beginning at age 20.  Clinical breast exam.** / Every 3 years for women in their 20s and 30s.  Pap test.** / Every 2 years from ages 21 through 29. Every 3 years starting at age 30 through age 65 or 70 with a history of 3 consecutive normal Pap tests.  HPV screening.** / Every 3 years from ages 30 through ages 65 to 70 with a history of 3 consecutive normal Pap tests.  Hepatitis C blood test.** / For any individual with known risks for hepatitis C.  Skin self-exam. / Monthly.  Influenza immunization.** / Every year.  Pneumococcal polysaccharide immunization.** / 1 to 2 doses if you smoke cigarettes or if you have certain chronic medical conditions.  Tetanus, diphtheria, pertussis (Tdap, Td) immunization. / A one-time dose of Tdap vaccine. After that, you need a Td booster dose every 10 years.  HPV immunization. / 3 doses over 6 months, if you are 26 and younger.  Measles, mumps, rubella (MMR) immunization. / You need at least 1 dose of MMR if you were born in 1957 or later. You may also need a second dose.  Meningococcal immunization. / 1 dose if you are age 19 to 21 and a first-year college student living in a residence hall, or have one of several medical conditions, you need to get vaccinated against meningococcal disease. You may also need additional booster doses.  Varicella immunization.** / Consult your caregiver.  Hepatitis A immunization.** / Consult your caregiver. 2 doses, 6 to 18 months apart.  Hepatitis B immunization.** / Consult your caregiver. 3 doses usually over 6 months. Ages 40 to 64  Blood pressure check.** / Every 1 to 2 years.  Lipid and cholesterol check.** / Every 5 years beginning at age 20.  Clinical breast exam.** / Every year after age 40.  Mammogram.** / Every year beginning at age 40   and continuing for as long as you are in good health. Consult with your  caregiver.  Pap test.** / Every 3 years starting at age 30 through age 65 or 70 with a history of 3 consecutive normal Pap tests.  HPV screening.** / Every 3 years from ages 30 through ages 65 to 70 with a history of 3 consecutive normal Pap tests.  Fecal occult blood test (FOBT) of stool. / Every year beginning at age 50 and continuing until age 75. You may not need to do this test if you get a colonoscopy every 10 years.  Flexible sigmoidoscopy or colonoscopy.** / Every 5 years for a flexible sigmoidoscopy or every 10 years for a colonoscopy beginning at age 50 and continuing until age 75.  Hepatitis C blood test.** / For all people born from 1945 through 1965 and any individual with known risks for hepatitis C.  Skin self-exam. / Monthly.  Influenza immunization.** / Every year.  Pneumococcal polysaccharide immunization.** / 1 to 2 doses if you smoke cigarettes or if you have certain chronic medical conditions.  Tetanus, diphtheria, pertussis (Tdap, Td) immunization.** / A one-time dose of Tdap vaccine. After that, you need a Td booster dose every 10 years.  Measles, mumps, rubella (MMR) immunization. / You need at least 1 dose of MMR if you were born in 1957 or later. You may also need a second dose.  Varicella immunization.** / Consult your caregiver.  Meningococcal immunization.** / Consult your caregiver.  Hepatitis A immunization.** / Consult your caregiver. 2 doses, 6 to 18 months apart.  Hepatitis B immunization.** / Consult your caregiver. 3 doses, usually over 6 months. Ages 65 and over  Blood pressure check.** / Every 1 to 2 years.  Lipid and cholesterol check.** / Every 5 years beginning at age 20.  Clinical breast exam.** / Every year after age 40.  Mammogram.** / Every year beginning at age 40 and continuing for as long as you are in good health. Consult with your caregiver.  Pap test.** / Every 3 years starting at age 30 through age 65 or 70 with a 3  consecutive normal Pap tests. Testing can be stopped between 65 and 70 with 3 consecutive normal Pap tests and no abnormal Pap or HPV tests in the past 10 years.  HPV screening.** / Every 3 years from ages 30 through ages 65 or 70 with a history of 3 consecutive normal Pap tests. Testing can be stopped between 65 and 70 with 3 consecutive normal Pap tests and no abnormal Pap or HPV tests in the past 10 years.  Fecal occult blood test (FOBT) of stool. / Every year beginning at age 50 and continuing until age 75. You may not need to do this test if you get a colonoscopy every 10 years.  Flexible sigmoidoscopy or colonoscopy.** / Every 5 years for a flexible sigmoidoscopy or every 10 years for a colonoscopy beginning at age 50 and continuing until age 75.  Hepatitis C blood test.** / For all people born from 1945 through 1965 and any individual with known risks for hepatitis C.  Osteoporosis screening.** / A one-time screening for women ages 65 and over and women at risk for fractures or osteoporosis.  Skin self-exam. / Monthly.  Influenza immunization.** / Every year.  Pneumococcal polysaccharide immunization.** / 1 dose at age 65 (or older) if you have never been vaccinated.  Tetanus, diphtheria, pertussis (Tdap, Td) immunization. / A one-time dose of Tdap vaccine if you are over   65 and have contact with an infant, are a healthcare worker, or simply want to be protected from whooping cough. After that, you need a Td booster dose every 10 years.  Varicella immunization.** / Consult your caregiver.  Meningococcal immunization.** / Consult your caregiver.  Hepatitis A immunization.** / Consult your caregiver. 2 doses, 6 to 18 months apart.  Hepatitis B immunization.** / Check with your caregiver. 3 doses, usually over 6 months. ** Family history and personal history of risk and conditions may change your caregiver's recommendations. Document Released: 11/20/2001 Document Revised: 12/17/2011  Document Reviewed: 02/19/2011 [employer] Patient Information 2014 [employer].  

## 2013-04-15 NOTE — Progress Notes (Signed)
Subjective:    Pam Cameron is a 67 y.o. female who presents for a welcome to Medicare exam.   Cardiac risk factors: advanced age (older than 29 for men, 17 for women), hypertension and sedentary lifestyle.  Activities of Daily Living  In your present state of health, do you have any difficulty performing the following activities?:  Preparing food and eating?: No Bathing yourself: No Getting dressed: No Using the toilet:No Moving around from place to place: No In the past year have you fallen or had a near fall?:No  Current exercise habits: Home exercise routine includes treadmill.   Dietary issues discussed: no   Depression Screen (Note: if answer to either of the following is "Yes", then a more complete depression screening is indicated)  Q1: Over the past two weeks, have you felt down, depressed or hopeless?no Q2: Over the past two weeks, have you felt little interest or pleasure in doing things? no   The following portions of the patient's history were reviewed and updated as appropriate:  She  has a past medical history of Hypertension; Anemia; Pneumonia; Depression; and Blood transfusion. She  does not have any pertinent problems on file. She  has past surgical history that includes Appendectomy; Tonsillectomy; Lung surgery; and Fixation kyphoplasty lumbar spine (12/14/2011). Her family history includes Cancer in her father and mother; Dementia in her father; Hypertension in her father and mother; Ovarian cancer in her mother; and Prostate cancer in her father. She  reports that she has been smoking.  She has never used smokeless tobacco. She reports that she drinks about 1.5 ounces of alcohol per week. She reports that she does not use illicit drugs. She has a current medication list which includes the following prescription(s): alendronate, biotin, flintstones complete, iron, and lisinopril-hydrochlorothiazide. Current Outpatient Prescriptions on File Prior to Visit   Medication Sig Dispense Refill  . alendronate (FOSAMAX) 70 MG tablet TAKE 1 TABLET ONCE A WEEK WITH A FULL GLASS OF WATER ON AN EMPTY STOMACH  4 tablet  0  . BIOTIN PO Take by mouth daily.        . flintstones complete (FLINTSTONES) 60 MG chewable tablet Chew 1 tablet by mouth daily.        . IRON PO Take 1 tablet by mouth daily.      Marland Kitchen lisinopril-hydrochlorothiazide (PRINZIDE,ZESTORETIC) 20-12.5 MG per tablet TAKE 1 TABLET BY MOUTH EVERY DAY  30 tablet  0   No current facility-administered medications on file prior to visit.   She is allergic to codeine.. Review of Systems  Review of Systems  Constitutional: Negative for activity change, appetite change and fatigue.  HENT: Negative for hearing loss, congestion, tinnitus and ear discharge.   Eyes: Negative for visual disturbance (see optho q1y -- vision corrected to 20/20 with glasses).  Respiratory: Negative for cough, chest tightness and shortness of breath.   Cardiovascular: Negative for chest pain, palpitations and leg swelling.  Gastrointestinal: Negative for abdominal pain, diarrhea, constipation and abdominal distention.  Genitourinary: Negative for urgency, frequency, decreased urine volume and difficulty urinating.  Musculoskeletal: Negative for back pain, arthralgias and gait problem.  Skin: Negative for color change, pallor and rash.  Neurological: Negative for dizziness, light-headedness, numbness and headaches.  Hematological: Negative for adenopathy. Does not bruise/bleed easily.  Psychiatric/Behavioral: Negative for suicidal ideas, confusion, sleep disturbance, self-injury, dysphoric mood, decreased concentration and agitation.  Pt is able to read and write and can do all ADLs No risk for falling No abuse/ violence in home  Objective:     Vision by Snellen chart: opth :,Blood pressure 118/72, pulse 73, temperature 98.5 F (36.9 C), temperature source Oral, height 5' 1.5" (1.562 m), weight 147 lb (66.679 kg),  SpO2 99.00%. Body mass index is 27.33 kg/(m^2). BP 118/72  Pulse 73  Temp(Src) 98.5 F (36.9 C) (Oral)  Ht 5' 1.5" (1.562 m)  Wt 147 lb (66.679 kg)  BMI 27.33 kg/m2  SpO2 99% General appearance: alert, cooperative, appears stated age and no distress Head: Normocephalic, without obvious abnormality, atraumatic Eyes: conjunctivae/corneas clear. PERRL, EOM's intact. Fundi benign. Ears: normal TM's and external ear canals both ears Nose:  Throat: lips, mucosa, and tongue normal; teeth and gums normal Neck: no adenopathy, no carotid bruit, no JVD, supple, symmetrical, trachea midline and thyroid not enlarged, symmetric, no tenderness/mass/nodules Back: symmetric, no curvature. ROM normal. No CVA tenderness. Lungs: rhonchi bilaterally and wheezes bilaterally Breasts: gyn Heart: regular rate and rhythm, S1, S2 normal, no murmur, click, rub or gallop Abdomen: soft, non-tender; bowel sounds normal; no masses,  no organomegaly Pelvic: deferred--gyn Extremities: extremities normal, atraumatic, no cyanosis or edema Pulses: 2+ and symmetric Skin: Skin color, texture, turgor normal. No rashes or lesions Lymph nodes: Cervical, supraclavicular, and axillary nodes normal. Neurologic: Alert and oriented X 3, normal strength and tone. Normal symmetric reflexes. Normal coordination and gait    Assessment:    cpe     Plan:     During the course of the visit the patient was educated and counseled about appropriate screening and preventive services including:   Pneumococcal vaccine   Td vaccine  Screening mammography  Screening Pap smear and pelvic exam   Bone densitometry screening  Colorectal cancer screening  Diabetes screening  Glaucoma screening  Smoking cessation counseling  Advanced directives: has an advanced directive - a copy HAS NOT been provided.  Patient Instructions (the written plan) was given to the patient.

## 2013-04-15 NOTE — Assessment & Plan Note (Signed)
Stable con't meds 

## 2013-04-15 NOTE — Assessment & Plan Note (Signed)
advair [id number] proair prn rto prn

## 2013-04-27 DIAGNOSIS — IMO0001 Reserved for inherently not codable concepts without codable children: Secondary | ICD-10-CM

## 2013-05-07 ENCOUNTER — Ambulatory Visit (INDEPENDENT_AMBULATORY_CARE_PROVIDER_SITE_OTHER): Payer: PRIVATE HEALTH INSURANCE | Admitting: Internal Medicine

## 2013-05-07 ENCOUNTER — Encounter: Payer: Self-pay | Admitting: Internal Medicine

## 2013-05-07 VITALS — BP 122/70 | HR 80 | Ht 62.0 in | Wt 137.0 lb

## 2013-05-07 DIAGNOSIS — E871 Hypo-osmolality and hyponatremia: Secondary | ICD-10-CM

## 2013-05-07 LAB — BASIC METABOLIC PANEL
BUN: 7 mg/dL (ref 6–23)
CO2: 28 mEq/L (ref 19–32)
Calcium: 9.1 mg/dL (ref 8.4–10.5)
Chloride: 92 mEq/L — ABNORMAL LOW (ref 96–112)
Creatinine, Ser: 0.6 mg/dL (ref 0.4–1.2)
GFR: 108.28 mL/min (ref >60.00)
Glucose, Bld: 81 mg/dL (ref 70–99)
Potassium: 3.9 mEq/L (ref 3.5–5.1)
Sodium: 127 mEq/L — ABNORMAL LOW (ref 135–145)

## 2013-05-07 MED ORDER — LISINOPRIL 20 MG PO TABS: 20.0000 mg | ORAL_TABLET | Freq: Every day | ORAL | Status: DC

## 2013-05-07 NOTE — Patient Instructions (Addendum)
Please stop the Lisinopril-HCTZ and start Lisinopril 20 mg (sent to your pharmacy). Please schedule a nurse appt for Blood Pressure check in ~2.5 weeks. Also stop at the lab then for another sodium check. We will schedule a new appt if lab still abnormal. Please join MyChart for easier communication.

## 2013-05-07 NOTE — Progress Notes (Addendum)
Subjective:     Patient ID: Pam Cameron, female   DOB: 1945/10/22, 67 y.o.   MRN: [id number]  HPI Pt is a pleasant 67 y/o referred by her PCP, Dr. Laury Axon, for evaluation for hyponatremia.  She has been dx with hyponatremia 4 years ago, right after dx of HTN, and starting HCTZ/Lisinopril.  Per review of the chart, Na was normal in 10/11/2008. Less then a month after (I believe HCTZ started then), Na decreased and stayed low with 1 exception in 2011 - I am not sure if pt was off the med then. She mentions that she remember overeating salt at one time, right before one of the tests, and was told labs are different, but then she switched back to her regular diet.  Importantly,the hyponatremic and is associated with hypochloremia, however, not with increased bicarbonate, except for last check on 04/15/2013, when bicarbonate returned at the upper limit of normal, at 32 (borderline contraction alkalosis?).   Patient had a urine specific gravity of less than 1.005 at the last check on 04/15/2013.  She does not add much salt to her food. She loves water and drinks fluids all day (mostly water and tea) >> 24-32 oz a day.  No swelling in legs or arms. Per her report, no heart, kidney, liver dysfunction. However, looking through her chart >> CHF mentioned (I could not find details). No increased TG.  She smokes 1 PPD for a long time. Has a chronic cough. Had a CT chest in 2007 >> no nodules. Had a CXR in 2012 >> no nodules. She drinks 3-6 beers per weekend.  She also has a h/o OP, on Fosamax. Had a vb compression fracture.  She will have her yearly mammogram this 05/2013. No Br CA history. Mother had uterine cancer.   No dizziness, no dehydration, no orthostasis. Very rarely has HAs. No blurry vision.   Review of Systems Constitutional: no weight gain/loss, no fatigue, no subjective hyperthermia/hypothermia, nocturia more than 1 Eyes: no blurry vision, no xerophthalmia ENT: no sore throat, no  nodules palpated in throat, no dysphagia/odynophagia, no hoarseness Cardiovascular: no CP/SOB/palpitations/leg swelling Respiratory: + cough/no SOB/+ wheezing Gastrointestinal: no N/V/D/C Musculoskeletal: no muscle/joint aches Skin: no rashes, + Hair loss Neurological: no tremors/numbness/tingling/dizziness Psychiatric: no depression/anxiety Low libido  Past Medical History  Diagnosis Date  . Hypertension   . Anemia   . Pneumonia   . Depression   . Blood transfusion   . CHF (congestive heart failure)    Past Surgical History  Procedure Laterality Date  . Appendectomy    . Tonsillectomy    . Lung surgery      fluid removed after pneumonia  . Fixation kyphoplasty lumbar spine  12/14/2011   History   Social History  . Marital Status: Single    Spouse Name: N/A    Number of Children: 0   Occupational History  . sales    Social History Main Topics  . Smoking status: Current Every Day Smoker -- 1.00 packs/day for 40 years  . Smokeless tobacco: Never Used  . Alcohol Use: 1.5 oz/week    3 drink(s) per week     Comment: moderate  . Drug Use: No  . Sexually Active: Yes -- Female partner(s)   Social History Narrative   Exercise--no   Current Outpatient Prescriptions on File Prior to Visit  Medication Sig Dispense Refill  . albuterol (PROAIR HFA) 108 (90 BASE) MCG/ACT inhaler Inhale 2 puffs into the lungs every 6 (six) hours as  needed for wheezing.      Marland Kitchen alendronate (FOSAMAX) 70 MG tablet TAKE 1 TABLET ONCE A WEEK WITH A FULL GLASS OF WATER ON AN EMPTY STOMACH  12 tablet  3  . BIOTIN PO Take by mouth daily.        . flintstones complete (FLINTSTONES) 60 MG chewable tablet Chew 1 tablet by mouth daily.        . Fluticasone-Salmeterol (ADVAIR DISKUS) 250-50 MCG/DOSE AEPB 1 inh bid  60 each    . IRON PO Take 1 tablet by mouth daily.       No current facility-administered medications on file prior to visit.   Allergies  Allergen Reactions  . Codeine     Abdominal cramping     Family History  Problem Relation Age of Onset  . Ovarian cancer Mother   . Hypertension Mother   . Cancer Mother     uterine  . Prostate cancer Father   . Hypertension Father   . Cancer Father     lung  . Dementia Father    Objective:   Physical Exam BP 122/70  Pulse 80  Ht 5\' 2"  (1.575 m)  Wt 137 lb (62.143 kg)  BMI 25.05 kg/m2  SpO2 95% Wt Readings from Last 3 Encounters:  05/07/13 137 lb (62.143 kg)  04/15/13 147 lb (66.679 kg)  03/24/12 147 lb 3.2 oz (66.769 kg)   Constitutional: normal weight, in NAD Eyes: PERRLA, EOMI, no exophthalmos ENT: moist mucous membranes, no thyromegaly, no cervical lymphadenopathy Cardiovascular: RRR, No MRG, no LE swelling Respiratory: CTA B Gastrointestinal: abdomen soft, NT, ND, BS+ Musculoskeletal: no deformities, strength intact in all 4 Skin: moist, warm, no rashes Neurological: no tremor with outstretched hands, DTR normal in all 4   Assessment:     1. Hyponatremia - on HCTZ 12.5 mg x 4 years    Plan:  Patient with long-standing hyponatremia, since 2010, after being diagnosed with hypertension and started on HCTZ. Sodium has fluctuated between 127 and 135, but at last check, it was 122. She has hypochloremia that is consistently associated with her hyponatremia.  In this case, especially in the setting of a  low urinary specific gravity, consideration is for water overload, diarrhea/vomiting, or long-standing diuretic use. No indication of pseudohyponatremia (no increased triglycerides or protein) no hypervolemia, no hyperglycemia. Since she denies diarrhea or vomiting, and she drinks water but not excessively, the only remaining concern is for long-standing diuretic use.  - I advised her to stop the lisinopril-HCTZ, and start on a lisinopril, and she will return in 2-1/2 weeks for a nurse appointment for blood pressure check and also to repeat the sodium level. If this is low, we'll check urine sodium, plasma and urine osmolality,  and thyroid function tests, and if all negative then we might need to test for adrenal insufficiency. I have a very low suspicion for the latter. - no labs for today - patient asked me whether she cannot improve her hyponatremia with diet, by increasing sodium, but for now I advised her to continue her regular diet - I also advised her about the fact that low sodium can impact her fracture risk, especially since she has osteoporosis >> regardless of the results, she will probably need to stay off HCTZ from now on - I will see the patient back if labs are abnormal after stopping the HCTZ - I will let Dr. Laury Axon about the change in blood pressure medication  Office Visit on 05/07/2013  Component  Date Value Range Status  . Sodium 05/07/2013 127* 135 - 145 mEq/L Final  . Potassium 05/07/2013 3.9  3.5 - 5.1 mEq/L Final  . Chloride 05/07/2013 92* 96 - 112 mEq/L Final  . CO2 05/07/2013 28  19 - 32 mEq/L Final  . Glucose, Bld 05/07/2013 81  70 - 99 mg/dL Final  . BUN 56/21/3086 7  6 - 23 mg/dL Final  . Creatinine, Ser 05/07/2013 0.6  0.4 - 1.2 mg/dL Final  . Calcium 57/84/6962 9.1  8.4 - 10.5 mg/dL Final  . GFR 95/28/4132 108.28  >60.00 mL/min Final   Unfortunately, the patient stopped at the lab on her way out, we'll need to repeat these tests in 2 weeks   06/01/2013: Na normalized after stopping HCTZ >> needs to stay off the med. Will let pt know. Component     Latest Ref Rng 05/26/2013  Sodium     135 - 145 mEq/L 135  Potassium     3.5 - 5.1 mEq/L 3.8  Chloride     96 - 112 mEq/L 103  CO2     19 - 32 mEq/L 26  Glucose     70 - 99 mg/dL 66 (L)  BUN     6 - 23 mg/dL 7  Creatinine     0.4 - 1.2 mg/dL 0.7  Calcium     8.4 - 10.5 mg/dL 8.6  GFR     >44.01 mL/min 96.81

## 2013-05-11 ENCOUNTER — Telehealth: Payer: Self-pay | Admitting: *Deleted

## 2013-05-11 NOTE — Addendum Note (Signed)
Addended by: Carlus Pavlov on: 05/11/2013 02:33 PM   Modules accepted: Orders

## 2013-05-11 NOTE — Telephone Encounter (Signed)
Called pt and let her know that we still need to return to check a sodium level in ~2.5 weeks after she stopped the HCTZ - she accidentally had the BMP drawn after the visit. Pt understood.

## 2013-05-26 ENCOUNTER — Other Ambulatory Visit (INDEPENDENT_AMBULATORY_CARE_PROVIDER_SITE_OTHER): Payer: PRIVATE HEALTH INSURANCE

## 2013-05-26 DIAGNOSIS — E871 Hypo-osmolality and hyponatremia: Secondary | ICD-10-CM

## 2013-05-26 LAB — BASIC METABOLIC PANEL
BUN: 7 mg/dL (ref 6–23)
CO2: 26 mEq/L (ref 19–32)
Calcium: 8.6 mg/dL (ref 8.4–10.5)
Chloride: 103 mEq/L (ref 96–112)
Creatinine, Ser: 0.7 mg/dL (ref 0.4–1.2)
GFR: 96.81 mL/min (ref >60.00)
Glucose, Bld: 66 mg/dL — ABNORMAL LOW (ref 70–99)
Potassium: 3.8 mEq/L (ref 3.5–5.1)
Sodium: 135 mEq/L (ref 135–145)

## 2013-06-01 ENCOUNTER — Encounter: Payer: Self-pay | Admitting: Internal Medicine

## 2013-06-03 LAB — HM MAMMOGRAPHY

## 2013-06-30 ENCOUNTER — Telehealth: Payer: Self-pay

## 2013-06-30 NOTE — Telephone Encounter (Signed)
Dr Elvera Lennox changed Rx for Lisinopril to 20mg  daily. Rx does not look like it went through the system. Please advise if it is ok to re-send.        KP

## 2013-06-30 NOTE — Telephone Encounter (Signed)
Ok to resend. Thank you.

## 2013-07-01 MED ORDER — LISINOPRIL 20 MG PO TABS: 20.0000 mg | ORAL_TABLET | Freq: Every day | ORAL | Status: DC

## 2013-07-01 NOTE — Telephone Encounter (Signed)
Done  KP

## 2013-07-01 NOTE — Telephone Encounter (Signed)
Ok to send in for 3 months.

## 2013-07-14 ENCOUNTER — Encounter: Payer: Self-pay | Admitting: Internal Medicine

## 2013-11-27 ENCOUNTER — Ambulatory Visit (INDEPENDENT_AMBULATORY_CARE_PROVIDER_SITE_OTHER): Payer: Medicare Other | Admitting: Family Medicine

## 2013-11-27 ENCOUNTER — Encounter: Payer: Self-pay | Admitting: Family Medicine

## 2013-11-27 VITALS — BP 134/86 | HR 82 | Temp 98.2°F | Wt 135.0 lb

## 2013-11-27 DIAGNOSIS — M25551 Pain in right hip: Secondary | ICD-10-CM

## 2013-11-27 DIAGNOSIS — M25559 Pain in unspecified hip: Secondary | ICD-10-CM

## 2013-11-27 NOTE — Progress Notes (Signed)
  Subjective:    Pam Cameron is a 68 y.o. female who presents with right hip pain. Onset of the symptoms was 6 days ago. Inciting event: fell while stepping out of tub. The patient reports the hip pain is aggravated by walking. Aggravating symptoms include: walking. Patient has had no prior hip problems. Previous visits for this problem: none. Evaluation to date: none. Treatment to date: none.  The following portions of the patient's history were reviewed and updated as appropriate: allergies, current medications, past family history, past medical history, past social history, past surgical history and problem list.   Review of Systems Pertinent items are noted in HPI.   Objective:    BP 134/86  Pulse 82  Temp(Src) 98.2 F (36.8 C) (Oral)  Wt 135 lb (61.236 kg)  SpO2 95% Right hip: positives: decreased abduction, decreased flexion, decreased ROM, pain with flexion, pain with movement of hip and pain with rotation  Left hip: full painless range of motion, without tenderness   Imaging: X-ray the right hip: not available    Assessment:    R hip pain    Plan:    Natural history and expected course discussed. Questions answered. Scientist, clinical (histocompatibility and immunogenetics) distributed. X-rays per orders.  Pt does not want pain meds

## 2013-11-27 NOTE — Patient Instructions (Signed)
Hip Pain  The hips join the upper legs to the lower pelvis. The bones, cartilage, tendons, and muscles of the hip joint perform a lot of work each day holding your body weight and allowing you to move around.  Hip pain is a common symptom. It can range from a minor ache to severe pain on 1 or both hips. Pain may be felt on the inside of the hip joint near the groin, or the outside near the buttocks and upper thigh. There may be swelling or stiffness as well. It occurs more often when a person walks or performs activity. There are many reasons hip pain can develop.  CAUSES   It is important to work with your caregiver to identify the cause since many conditions can impact the bones, cartilage, muscles, and tendons of the hips. Causes for hip pain include:   Broken (fractured) bones.   Separation of the thighbone from the hip socket (dislocation).   Torn cartilage of the hip joint.   Swelling (inflammation) of a tendon (tendonitis), the sac within the hip joint (bursitis), or a joint.   A weakening in the abdominal wall (hernia), affecting the nerves to the hip.   Arthritis in the hip joint or lining of the hip joint.   Pinched nerves in the back, hip, or upper thigh.   A bulging disc in the spine (herniated disc).   Rarely, bone infection or cancer.  DIAGNOSIS   The location of your hip pain will help your caregiver understand what may be causing the pain. A diagnosis is based on your medical history, your symptoms, results from your physical exam, and results from diagnostic tests. Diagnostic tests may include X-ray exams, a computerized magnetic scan (magnetic resonance imaging, MRI), or bone scan.  TREATMENT   Treatment will depend on the cause of your hip pain. Treatment may include:   Limiting activities and resting until symptoms improve.   Crutches or other walking supports (a cane or brace).   Ice, elevation, and compression.   Physical therapy or home exercises.   Shoe inserts or special  shoes.   Losing weight.   Medications to reduce pain.   Undergoing surgery.  HOME CARE INSTRUCTIONS    Only take over-the-counter or prescription medicines for pain, discomfort, or fever as directed by your caregiver.   Put ice on the injured area:   Put ice in a plastic bag.   Place a towel between your skin and the bag.   Leave the ice on for 15-20 minutes at a time, 03-04 times a day.   Keep your leg raised (elevated) when possible to lessen swelling.   Avoid activities that cause pain.   Follow specific exercises as directed by your caregiver.   Sleep with a pillow between your legs on your most comfortable side.   Record how often you have hip pain, the location of the pain, and what it feels like. This information may be helpful to you and your caregiver.   Ask your caregiver about returning to work or sports and whether you should drive.   Follow up with your caregiver for further exams, therapy, or testing as directed.  SEEK MEDICAL CARE IF:    Your pain or swelling continues or worsens after 1 week.   You are feeling unwell or have chills.   You have increasing difficulty with walking.   You have a loss of sensation or other new symptoms.   You have questions or concerns.  SEEK   IMMEDIATE MEDICAL CARE IF:    You cannot put weight on the affected hip.   You have fallen.   You have a sudden increase in pain and swelling in your hip.   You have a fever.  MAKE SURE YOU:    Understand these instructions.   Will watch your condition.   Will get help right away if you are not doing well or get worse.  Document Released: 03/14/2010 Document Revised: 12/17/2011 Document Reviewed: 03/14/2010  [employer] Patient Information 2014 [employer].

## 2013-11-27 NOTE — Progress Notes (Signed)
Pre visit review using our clinic review tool, if applicable. No additional management support is needed unless otherwise documented below in the visit note. 

## 2013-11-30 ENCOUNTER — Ambulatory Visit
Admission: RE | Admit: 2013-11-30 | Discharge: 2013-11-30 | Disposition: A | Discharge status: Home / Self Care | Payer: Medicare Other | Source: Ambulatory Visit | Attending: Family Medicine | Admitting: Family Medicine

## 2013-11-30 ENCOUNTER — Telehealth: Payer: Self-pay | Admitting: Family Medicine

## 2013-11-30 DIAGNOSIS — M25551 Pain in right hip: Secondary | ICD-10-CM

## 2013-11-30 NOTE — Telephone Encounter (Signed)
Relevant patient education mailed to patient.  

## 2013-12-04 ENCOUNTER — Other Ambulatory Visit: Payer: Self-pay | Admitting: Family Medicine

## 2013-12-04 ENCOUNTER — Telehealth: Payer: Self-pay | Admitting: Family Medicine

## 2013-12-04 DIAGNOSIS — M25559 Pain in unspecified hip: Secondary | ICD-10-CM

## 2013-12-04 NOTE — Telephone Encounter (Signed)
Patient left 2 message on triage line requesting call back with xray results. CB# [phone]

## 2013-12-04 NOTE — Telephone Encounter (Signed)
Refer to ortho -- for hip pain Referral put in

## 2013-12-04 NOTE — Telephone Encounter (Signed)
Patient aware and voiced understanding.      KP

## 2013-12-04 NOTE — Telephone Encounter (Signed)
Patient said the pain has not improved at all and she would like to know what to do next.   [phone].    KP

## 2013-12-04 NOTE — Telephone Encounter (Signed)
Normal

## 2013-12-10 ENCOUNTER — Other Ambulatory Visit: Payer: Self-pay | Admitting: Orthopedic Surgery

## 2013-12-10 DIAGNOSIS — M25551 Pain in right hip: Secondary | ICD-10-CM

## 2013-12-11 ENCOUNTER — Ambulatory Visit
Admission: RE | Admit: 2013-12-11 | Discharge: 2013-12-11 | Disposition: A | Discharge status: Home / Self Care | Payer: Medicare Other | Source: Ambulatory Visit | Attending: Orthopedic Surgery | Admitting: Orthopedic Surgery

## 2013-12-11 DIAGNOSIS — M25551 Pain in right hip: Secondary | ICD-10-CM

## 2014-03-15 ENCOUNTER — Other Ambulatory Visit: Payer: Self-pay | Admitting: Family Medicine

## 2014-04-08 ENCOUNTER — Encounter: Payer: Self-pay | Admitting: Family Medicine

## 2014-04-19 ENCOUNTER — Other Ambulatory Visit: Payer: Self-pay | Admitting: *Deleted

## 2014-04-19 MED ORDER — LISINOPRIL 20 MG PO TABS: 20.0000 mg | ORAL_TABLET | Freq: Every day | ORAL | Status: DC

## 2014-06-16 ENCOUNTER — Encounter: Payer: Self-pay | Admitting: Family Medicine

## 2014-07-01 ENCOUNTER — Encounter (HOSPITAL_COMMUNITY): Payer: Self-pay | Admitting: Emergency Medicine

## 2014-07-01 ENCOUNTER — Emergency Department (HOSPITAL_COMMUNITY)
Admission: EM | Admit: 2014-07-01 | Discharge: 2014-07-01 | Disposition: A | Discharge status: Home / Self Care | Payer: Medicare Other | Source: Home / Self Care | Attending: Family Medicine | Admitting: Family Medicine

## 2014-07-01 DIAGNOSIS — H109 Unspecified conjunctivitis: Secondary | ICD-10-CM

## 2014-07-01 MED ORDER — POLYMYXIN B-TRIMETHOPRIM 10000-0.1 UNIT/ML-% OP SOLN: 1.0000 [drp] | OPHTHALMIC | Status: DC

## 2014-07-01 NOTE — ED Provider Notes (Signed)
CSN: [id number]     Arrival date & time 07/01/14  0840 History   First MD Initiated Contact with Patient 07/01/14 [phone]     Chief Complaint  Patient presents with  . Conjunctivitis   (Consider location/radiation/quality/duration/timing/severity/associated sxs/prior Treatment) Patient is a 68 y.o. female presenting with conjunctivitis. The history is provided by the patient.  Conjunctivitis This is a new problem. The current episode started 2 days ago. The problem occurs constantly. The problem has been gradually worsening. Nothing relieves the symptoms.   Pam Cameron is a 68 y.o. female who presents to the [facility] with eye redness and irritation. She denies fever, chills or other problems. She has been using OTC medication without relief.   Past Medical History  Diagnosis Date  . Hypertension   . Anemia   . Pneumonia   . Depression   . Blood transfusion   . CHF (congestive heart failure)    Past Surgical History  Procedure Laterality Date  . Appendectomy    . Tonsillectomy    . Lung surgery      fluid removed after pneumonia  . Fixation kyphoplasty lumbar spine  12/14/2011   Family History  Problem Relation Age of Onset  . Ovarian cancer Mother   . Hypertension Mother   . Cancer Mother     uterine  . Prostate cancer Father   . Hypertension Father   . Cancer Father     lung  . Dementia Father    History  Substance Use Topics  . Smoking status: Current Every Day Smoker -- 1.00 packs/day for 40 years  . Smokeless tobacco: Never Used  . Alcohol Use: 1.5 oz/week    3 drink(s) per week     Comment: moderate   OB History   Grav Para Term Preterm Abortions TAB SAB Ect Mult Living                 Review of Systems Negative except as stated in HPI  Allergies  Codeine  Home Medications   Prior to Admission medications   Medication Sig Start Date End Date Taking? Authorizing Provider  BIOTIN PO Take by mouth daily.     Yes Historical Provider, MD  lisinopril  (PRINIVIL,ZESTRIL) 20 MG tablet Take 1 tablet (20 mg total) by mouth daily. 04/19/14  Yes Philemon Kingdom, MD  albuterol (PROAIR HFA) 108 (90 BASE) MCG/ACT inhaler Inhale 2 puffs into the lungs every 6 (six) hours as needed for wheezing. 04/15/13   Rosalita Chessman, DO  alendronate (FOSAMAX) 70 MG tablet TAKE 1 TABLET ONCE A WEEK WITH A FULL GLASS OF WATER ON AN EMPTY STOMACH 03/15/14   Rosalita Chessman, DO  flintstones complete (FLINTSTONES) 60 MG chewable tablet Chew 1 tablet by mouth daily.      Historical Provider, MD  Fluticasone-Salmeterol (ADVAIR DISKUS) 250-50 MCG/DOSE AEPB 1 inh bid 04/15/13   Rosalita Chessman, DO  IRON PO Take 1 tablet by mouth daily.    Historical Provider, MD  lisinopril-hydrochlorothiazide (PRINZIDE,ZESTORETIC) 20-12.5 MG per tablet  04/29/13   Historical Provider, MD   BP 160/89  Pulse 88  Temp(Src) 98.2 F (36.8 C) (Oral)  Resp 18  SpO2 96% Physical Exam  Nursing note and vitals reviewed. Constitutional: She is oriented to person, place, and time. She appears well-developed and well-nourished.  HENT:  Head: Normocephalic.  Eyes: EOM are normal. Pupils are equal, round, and reactive to light. Right eye exhibits exudate. Right conjunctiva is injected.  Neck: Neck supple.  Cardiovascular: Normal rate and regular rhythm.   Pulmonary/Chest: Effort normal and breath sounds normal.  Musculoskeletal: Normal range of motion.  Neurological: She is alert and oriented to person, place, and time. No cranial nerve deficit.  Skin: Skin is warm and dry.  Psychiatric: She has a normal mood and affect. Her behavior is normal.    ED Course  Procedures   MDM  68 y.o. female with right conjunctival irritation and drainage. Will treat for conjunctivitis. Discussed with the patient treatment plan and she voices understanding and agrees with plan.    Medication List    TAKE these medications       trimethoprim-polymyxin b ophthalmic solution  Commonly known as:  POLYTRIM  Place 1  drop into the right eye every 4 (four) hours.      ASK your doctor about these medications       albuterol 108 (90 BASE) MCG/ACT inhaler  Commonly known as:  PROAIR HFA  Inhale 2 puffs into the lungs every 6 (six) hours as needed for wheezing.     alendronate 70 MG tablet  Commonly known as:  FOSAMAX  TAKE 1 TABLET ONCE A WEEK WITH A FULL GLASS OF WATER ON AN EMPTY STOMACH     BIOTIN PO  Take by mouth daily.     flintstones complete 60 MG chewable tablet  Chew 1 tablet by mouth daily.     Fluticasone-Salmeterol 250-50 MCG/DOSE Aepb  Commonly known as:  ADVAIR DISKUS  1 inh bid     IRON PO  Take 1 tablet by mouth daily.     lisinopril 20 MG tablet  Commonly known as:  PRINIVIL,ZESTRIL  Take 1 tablet (20 mg total) by mouth daily.     lisinopril-hydrochlorothiazide 20-12.5 MG per tablet  Commonly known as:  [facility], NP 07/01/14 [phone]

## 2014-07-01 NOTE — Discharge Instructions (Signed)

## 2014-07-01 NOTE — ED Notes (Signed)
Reports redness, irritation, and mild drainage of the right eye since Wednesday morning.  Denies fever, n/v/d.   No relief with otc eye drops.

## 2014-07-03 NOTE — ED Provider Notes (Signed)
Medical screening examination/treatment/procedure(s) were performed by a resident physician or non-physician practitioner and as the supervising physician I was immediately available for consultation/collaboration.  Lynne Leader, MD    Gregor Hams, MD 07/03/14 [phone]

## 2014-08-31 ENCOUNTER — Ambulatory Visit (INDEPENDENT_AMBULATORY_CARE_PROVIDER_SITE_OTHER): Payer: Medicare Other | Admitting: Medical

## 2014-08-31 ENCOUNTER — Encounter: Payer: Self-pay | Admitting: Medical

## 2014-08-31 VITALS — BP 170/90 | HR 77 | Temp 98.0°F | Ht 60.75 in | Wt 139.0 lb

## 2014-08-31 DIAGNOSIS — J069 Acute upper respiratory infection, unspecified: Secondary | ICD-10-CM | POA: Insufficient documentation

## 2014-08-31 MED ORDER — FLUTICASONE PROPIONATE 50 MCG/ACT NA SUSP: 2.0000 | Freq: Every day | NASAL | Status: DC

## 2014-08-31 MED ORDER — AZITHROMYCIN 250 MG PO TABS: ORAL_TABLET | ORAL | Status: DC

## 2014-08-31 MED ORDER — LISINOPRIL 40 MG PO TABS: 40.0000 mg | ORAL_TABLET | Freq: Every day | ORAL | Status: DC

## 2014-08-31 MED ORDER — BENZONATATE 100 MG PO CAPS: 100.0000 mg | ORAL_CAPSULE | Freq: Three times a day (TID) | ORAL | Status: DC | PRN

## 2014-08-31 NOTE — Progress Notes (Signed)
Pre visit review using our clinic review tool, if applicable. No additional management support is needed unless otherwise documented below in the visit note. 

## 2014-08-31 NOTE — Assessment & Plan Note (Signed)
For your blood pressure which is not corntolled  last visit and this visit.  I am increasing your lisinopril to 40 mg since diuretic caused you fatigue in the past.  Please get bp cuff otc and check your bp daily. Document readings and update [place] with readings in 2 wks.

## 2014-08-31 NOTE — Patient Instructions (Signed)
You have likely uri vs early bronchitis. By exam I don't think pneumonia presently. I am prescribing benzonatate for cough, flonase for nasal congestion, and if worsens as described then azithromycin antibiotic.  For your blood pressure. I am increasing your lisinopril to 40 mg since diuretic caused you fatigue in the past.  Follow up in 2 months or as needed.  Please get bp cuff otc and check your bp daily. Document readings and update [place] with readings in 2 wks.

## 2014-08-31 NOTE — Progress Notes (Signed)
Subjective:    Patient ID: Pam Cameron, female    DOB: 12/03/1945, 68 y.o.   MRN: [id number]  HPI   Pt in with coughing since last Tuesday. Pt states hx of pneumonia. Pt has productive cough. No fever or chills. No severe fatigue. Pt smokes one pack a day.   Pt states does not get chronic bronchitis.  No sinus pressure. Some nasal congestion.  Pt has no wheezing. Has 2 inhalers which she never used.  Pt bp initially high when my nurse checked. Pt on no otc decongestant. She took her medication today. No cardiac or neurologic signs or symptoms. Pt has no blood pressure.  Past Medical History  Diagnosis Date  . Hypertension   . Anemia   . Pneumonia   . Depression   . Blood transfusion   . CHF (congestive heart failure)     History   Social History  . Marital Status: Single    Spouse Name: N/A    Number of Children: 0  . Years of Education: N/A   Occupational History  . sales    Social History Main Topics  . Smoking status: Current Every Day Smoker -- 1.00 packs/day for 40 years  . Smokeless tobacco: Never Used  . Alcohol Use: 1.5 oz/week    3 drink(s) per week     Comment: moderate  . Drug Use: No  . Sexual Activity:    Partners: Male   Other Topics Concern  . Not on file   Social History Narrative   Exercise--no    Past Surgical History  Procedure Laterality Date  . Appendectomy    . Tonsillectomy    . Lung surgery      fluid removed after pneumonia  . Fixation kyphoplasty lumbar spine  12/14/2011    Family History  Problem Relation Age of Onset  . Ovarian cancer Mother   . Hypertension Mother   . Cancer Mother     uterine  . Prostate cancer Father   . Hypertension Father   . Cancer Father     lung  . Dementia Father     Allergies  Allergen Reactions  . Codeine     Abdominal cramping    Current Outpatient Prescriptions on File Prior to Visit  Medication Sig Dispense Refill  . alendronate (FOSAMAX) 70 MG tablet TAKE 1 TABLET ONCE A  WEEK WITH A FULL GLASS OF WATER ON AN EMPTY STOMACH 12 tablet 3  . BIOTIN PO Take by mouth daily.      . flintstones complete (FLINTSTONES) 60 MG chewable tablet Chew 1 tablet by mouth daily.      . IRON PO Take 1 tablet by mouth daily.    Marland Kitchen lisinopril-hydrochlorothiazide (PRINZIDE,ZESTORETIC) 20-12.5 MG per tablet     . albuterol (PROAIR HFA) 108 (90 BASE) MCG/ACT inhaler Inhale 2 puffs into the lungs every 6 (six) hours as needed for wheezing. (Patient not taking: Reported on 08/31/2014)    . Fluticasone-Salmeterol (ADVAIR DISKUS) 250-50 MCG/DOSE AEPB 1 inh bid (Patient not taking: Reported on 08/31/2014) 60 each    No current facility-administered medications on file prior to visit.    BP 170/90 mmHg  Pulse 77  Temp(Src) 98 F (36.7 C) (Oral)  Ht 5' 0.75" (1.543 m)  Wt 139 lb (63.05 kg)  BMI 26.48 kg/m2  SpO2 98%     Review of Systems  Constitutional: Negative for fever, chills and fatigue.  HENT: Positive for congestion. Negative for ear discharge, ear pain,  nosebleeds, postnasal drip, rhinorrhea, sinus pressure, sore throat and trouble swallowing.   Respiratory: Positive for cough. Negative for chest tightness, shortness of breath and wheezing.   Cardiovascular: Negative for chest pain and palpitations.  Gastrointestinal: Negative for nausea, vomiting, abdominal pain, diarrhea and constipation.  Genitourinary: Negative for dysuria and flank pain.  Musculoskeletal: Positive for back pain.       Occasional transient back pain when she coughs.  Neurological: Negative for dizziness, tremors, seizures, syncope, facial asymmetry, speech difficulty, weakness, light-headedness, numbness and headaches.  Hematological: Negative for adenopathy. Does not bruise/bleed easily.       Objective:   Physical Exam   General Mental Status- Alert. General Appearance- Not in acute distress.   Skin General: Color- Normal Color. Moisture- Normal Moisture.  Neck Carotid Arteries- Normal  color. Moisture- Normal Moisture. No carotid bruits. No JVD.  HEENT Head- Normal. Ear Auditory Canal - Left- Normal. Right - Normal.Tympanic Membrane- Left- Normal. Right- Normal. Eye Sclera/Conjunctiva- Left- Normal. Right- Normal. Nose & Sinuses Nasal Mucosa- Left- Boggy + Congested. Right- Boggy + Congested. No sinus pressure.   Chest and Lung Exam Auscultation: Breath Sounds:-Normal. CTA.  Cardiovascular Auscultation:Rythm- Regula, rate and rhythm. Murmurs & Other Heart Sounds:Auscultation of the heart reveals- No Murmurs.  Abdomen Inspection:-Inspeection Normal. Palpation/Percussion:Note:No mass. Palpation and Percussion of the abdomen reveal- Non Tender, Non Distended + BS, no rebound or guarding.    Neurologic Cranial Nerve exam:- CN III-XII intact(No nystagmus), symmetric smile. Drift Test:- No drift. Romberg Exam:- Negative.  Finger to Nose:- Normal/Intact Strength:- 5/5 equal and symmetric strength both upper and lower extremities.                Assessment & Plan:

## 2014-08-31 NOTE — Assessment & Plan Note (Signed)
uri vs early bronchitis. By exam I don't think pneumonia presently. I am prescribing benzonatate for cough, flonase for nasal congestion, and if worsens as described then azithromycin antibiotic.

## 2014-10-27 ENCOUNTER — Other Ambulatory Visit: Payer: Self-pay | Admitting: Internal Medicine

## 2014-10-27 NOTE — Telephone Encounter (Signed)
Further refills per PCP

## 2014-11-23 ENCOUNTER — Other Ambulatory Visit: Payer: Self-pay | Admitting: Internal Medicine

## 2014-11-29 ENCOUNTER — Telehealth: Payer: Self-pay | Admitting: Family Medicine

## 2014-11-29 NOTE — Telephone Encounter (Signed)
Lisinopril refill sent to pharmacy. Pt need follow up with PCP (Lowne) before further refills are due.

## 2014-11-29 NOTE — Telephone Encounter (Signed)
Left message for patient to return my call.

## 2014-11-30 NOTE — Telephone Encounter (Signed)
Informed patient of med refill and she scheduled appointment

## 2014-12-07 ENCOUNTER — Ambulatory Visit (INDEPENDENT_AMBULATORY_CARE_PROVIDER_SITE_OTHER): Payer: Medicare Other | Admitting: Family Medicine

## 2014-12-07 ENCOUNTER — Encounter: Payer: Self-pay | Admitting: Family Medicine

## 2014-12-07 ENCOUNTER — Ambulatory Visit (HOSPITAL_BASED_OUTPATIENT_CLINIC_OR_DEPARTMENT_OTHER)
Admission: RE | Admit: 2014-12-07 | Discharge: 2014-12-07 | Disposition: A | Discharge status: Home / Self Care | Payer: Medicare Other | Source: Ambulatory Visit | Attending: Family Medicine | Admitting: Family Medicine

## 2014-12-07 VITALS — BP 188/98 | HR 88 | Temp 98.1°F | Wt 136.4 lb

## 2014-12-07 DIAGNOSIS — I10 Essential (primary) hypertension: Secondary | ICD-10-CM

## 2014-12-07 DIAGNOSIS — J208 Acute bronchitis due to other specified organisms: Secondary | ICD-10-CM | POA: Diagnosis not present

## 2014-12-07 DIAGNOSIS — R0989 Other specified symptoms and signs involving the circulatory and respiratory systems: Secondary | ICD-10-CM | POA: Insufficient documentation

## 2014-12-07 DIAGNOSIS — J441 Chronic obstructive pulmonary disease with (acute) exacerbation: Secondary | ICD-10-CM

## 2014-12-07 DIAGNOSIS — J439 Emphysema, unspecified: Secondary | ICD-10-CM | POA: Diagnosis not present

## 2014-12-07 DIAGNOSIS — R05 Cough: Secondary | ICD-10-CM | POA: Insufficient documentation

## 2014-12-07 DIAGNOSIS — R059 Cough, unspecified: Secondary | ICD-10-CM

## 2014-12-07 MED ORDER — AMLODIPINE BESYLATE 5 MG PO TABS: 5.0000 mg | ORAL_TABLET | Freq: Every day | ORAL | Status: DC

## 2014-12-07 MED ORDER — CLARITHROMYCIN ER 500 MG PO TB24: 1000.0000 mg | ORAL_TABLET | Freq: Every day | ORAL | Status: DC

## 2014-12-07 MED ORDER — IPRATROPIUM-ALBUTEROL 0.5-2.5 (3) MG/3ML IN SOLN
3.0000 mL | Freq: Once | RESPIRATORY_TRACT | Status: AC
Start: 2014-12-07 — End: 2014-12-07
  Administered 2014-12-07: 3 mL via RESPIRATORY_TRACT

## 2014-12-07 NOTE — Patient Instructions (Signed)

## 2014-12-07 NOTE — Progress Notes (Signed)
Subjective:    Patient ID: Pam Cameron, female    DOB: [birth year], 69 y.o.   MRN: [id number]  HPI  Patient here for f/u high bp.  She is also still coughing a lot.  She is not using her inhalers.    Past Medical History  Diagnosis Date  . Hypertension   . Anemia   . Pneumonia   . Depression   . Blood transfusion   . CHF (congestive heart failure)    Current Outpatient Prescriptions  Medication Sig Dispense Refill  . albuterol (PROAIR HFA) 108 (90 BASE) MCG/ACT inhaler Inhale 2 puffs into the lungs every 6 (six) hours as needed for wheezing.    Marland Kitchen alendronate (FOSAMAX) 70 MG tablet TAKE 1 TABLET ONCE A WEEK WITH A FULL GLASS OF WATER ON AN EMPTY STOMACH 12 tablet 3  . BIOTIN PO Take by mouth daily.      . flintstones complete (FLINTSTONES) 60 MG chewable tablet Chew 1 tablet by mouth daily.      . fluticasone (FLONASE) 50 MCG/ACT nasal spray Place 2 sprays into both nostrils daily. 16 g 0  . Fluticasone-Salmeterol (ADVAIR DISKUS) 250-50 MCG/DOSE AEPB 1 inh bid 60 each   . IRON PO Take 1 tablet by mouth daily.    Marland Kitchen lisinopril (PRINIVIL,ZESTRIL) 40 MG tablet TAKE 1 TABLET (40 MG TOTAL) BY MOUTH DAILY. 30 tablet 0  . amLODipine (NORVASC) 5 MG tablet Take 1 tablet (5 mg total) by mouth daily. 90 tablet 3  . clarithromycin (BIAXIN XL) 500 MG 24 hr tablet Take 2 tablets (1,000 mg total) by mouth daily. 28 tablet 0   No current facility-administered medications for this visit.     Review of Systems  Constitutional: Negative for fever and chills.  HENT: Negative for congestion, postnasal drip, rhinorrhea and sinus pressure.   Respiratory: Positive for cough, chest tightness, shortness of breath and wheezing.   Cardiovascular: Negative for chest pain, palpitations and leg swelling.  Allergic/Immunologic: Negative for environmental allergies.  Neurological: Negative for dizziness, weakness and headaches.  Psychiatric/Behavioral: Negative for hallucinations, behavioral problems,  confusion, dysphoric mood, decreased concentration and agitation. The patient is not nervous/anxious and is not hyperactive.        Objective:    Physical Exam  Constitutional: She is oriented to person, place, and time. She appears well-developed and well-nourished. No distress.  HENT:  Right Ear: External ear normal.  Left Ear: External ear normal.  Nose: Nose normal.  Mouth/Throat: Oropharynx is clear and moist.  Eyes: EOM are normal. Pupils are equal, round, and reactive to light.  Neck: Normal range of motion. Neck supple.  Cardiovascular: Normal rate, regular rhythm and normal heart sounds.   No murmur heard. Pulmonary/Chest: Effort normal. No respiratory distress. She has wheezes. She has rales. She exhibits no tenderness.  Neurological: She is alert and oriented to person, place, and time.  Psychiatric: She has a normal mood and affect. Her behavior is normal. Judgment and thought content normal.    BP 188/98 mmHg  Pulse 88  Temp(Src) 98.1 F (36.7 C) (Oral)  Wt 136 lb 6.4 oz (61.871 kg)  SpO2 95% Wt Readings from Last 3 Encounters:  12/07/14 136 lb 6.4 oz (61.871 kg)  08/31/14 139 lb (63.05 kg)  11/27/13 135 lb (61.236 kg)     Lab Results  Component Value Date   WBC 5.6 04/15/2013   HGB 13.9 04/15/2013   HCT 40.0 04/15/2013   PLT 216.0 04/15/2013   GLUCOSE 66*  05/26/2013   CHOL 188 04/15/2013   TRIG 62.0 04/15/2013   HDL 90.90 04/15/2013   LDLCALC 85 04/15/2013   ALT 18 04/15/2013   AST 26 04/15/2013   NA 135 05/26/2013   K 3.8 05/26/2013   CL 103 05/26/2013   CREATININE 0.7 05/26/2013   BUN 7 05/26/2013   CO2 26 05/26/2013   TSH 0.77 03/24/2012   INR 0.91 12/14/2011   MICROALBUR 1.1 04/15/2013   ekg--- sinus No results found.     Assessment & Plan:   Problem List Items Addressed This Visit    None    Visit Diagnoses    Essential hypertension    -  Primary    Relevant Medications    amLODIpine (NORVASC) tablet    Other Relevant Orders      EKG 12-Lead (Completed)    2D Echocardiogram without contrast    COPD exacerbation        Relevant Medications    ipratropium-albuterol (DUONEB) 0.5-2.5 (3) MG/3ML nebulizer solution 3 mL (Completed)    Cough        Relevant Orders    DG Chest 2 View    Acute bronchitis due to other specified organisms        Relevant Medications    clarithromycin (BIAXIN XL) 24 hr tablet 500 mg        Garnet Koyanagi, DO

## 2014-12-07 NOTE — Progress Notes (Signed)
Pre visit review using our clinic review tool, if applicable. No additional management support is needed unless otherwise documented below in the visit note. 

## 2014-12-08 ENCOUNTER — Telehealth: Payer: Self-pay | Admitting: *Deleted

## 2014-12-08 ENCOUNTER — Telehealth: Payer: Self-pay | Admitting: Family Medicine

## 2014-12-08 MED ORDER — ALBUTEROL SULFATE HFA 108 (90 BASE) MCG/ACT IN AERS: 2.0000 | INHALATION_SPRAY | Freq: Four times a day (QID) | RESPIRATORY_TRACT | Status: DC | PRN

## 2014-12-08 MED ORDER — CLARITHROMYCIN 500 MG PO TABS: 500.0000 mg | ORAL_TABLET | Freq: Two times a day (BID) | ORAL | Status: DC

## 2014-12-08 NOTE — Telephone Encounter (Signed)
clarithromycin (BIAXIN XL) 500 was faxed in last night but too expensive. Would you advise on new ABX in Dr.Lowne's absence.   Thanks     KP

## 2014-12-08 NOTE — Telephone Encounter (Signed)
Expedited prior authorization initiated for clarithromycin. Awaiting determination. JG//CMA

## 2014-12-08 NOTE — Telephone Encounter (Signed)
Pt states antibiotics prescribed (pt does not know the name of RX) was to expensive and medicare does not cover. In addition pt thought she had an inhaler requesting a RX. Please advise

## 2014-12-08 NOTE — Telephone Encounter (Signed)
Caller name: Tomi Bamberger from [facility]  Call back number: [phone]  Reason for call:  Pt states clarithromycin (BIAXIN) 500 MG tablet was denied requesting Clartin 500MG  tab (not extened release) just the regular tab. Please advise

## 2014-12-08 NOTE — Telephone Encounter (Signed)
Plain Biaxin 500mg  BID x14 days should be covered by insurance (not the XL)

## 2014-12-08 NOTE — Telephone Encounter (Signed)
VM left advising Rx faxed.   KP 

## 2014-12-09 NOTE — Telephone Encounter (Signed)
Patient picked up the Biaxin and It was covered.       KP

## 2014-12-15 ENCOUNTER — Ambulatory Visit (HOSPITAL_COMMUNITY): Payer: Medicare Other | Attending: Cardiovascular Disease

## 2014-12-15 DIAGNOSIS — I10 Essential (primary) hypertension: Secondary | ICD-10-CM | POA: Diagnosis present

## 2014-12-15 DIAGNOSIS — R0600 Dyspnea, unspecified: Secondary | ICD-10-CM

## 2014-12-15 NOTE — Progress Notes (Signed)
2D Echo completed. 12/15/2014

## 2014-12-16 ENCOUNTER — Telehealth: Payer: Self-pay | Admitting: Family Medicine

## 2014-12-16 NOTE — Telephone Encounter (Signed)
Caller name:Laurisa Relationship to patient:self Can be reached:[phone] Pharmacy:  Reason for call:Bp 156/86

## 2014-12-16 NOTE — Telephone Encounter (Signed)
Pt called to report blood pressure.  Blood pressure is improved and she is asymptomatic.

## 2014-12-20 NOTE — Telephone Encounter (Signed)
Noted--- i would like readings

## 2014-12-20 NOTE — Telephone Encounter (Signed)
Spoke with patient and her BP was 156/86 last week, she will go again to have it rechecked this week. She said it was way down and she will call back when she has it checked this week.     KP

## 2014-12-28 ENCOUNTER — Other Ambulatory Visit: Payer: Self-pay | Admitting: Family Medicine

## 2015-02-15 ENCOUNTER — Other Ambulatory Visit: Payer: Self-pay

## 2015-02-15 DIAGNOSIS — I10 Essential (primary) hypertension: Secondary | ICD-10-CM

## 2015-02-15 MED ORDER — ALENDRONATE SODIUM 70 MG PO TABS: ORAL_TABLET | ORAL | Status: DC

## 2015-06-21 ENCOUNTER — Other Ambulatory Visit: Payer: Self-pay | Admitting: Family Medicine

## 2015-06-21 NOTE — Telephone Encounter (Signed)
Lisinopril refill sent to pharmacy, #30 x 1 refill. Pt last seen 12/2014 and is due for follow up of BP with Dr Etter Sjogren now.  Please call pt to arrange appt. Thanks!

## 2015-06-21 NOTE — Telephone Encounter (Signed)
Patient scheduled for 06/30/15

## 2015-06-30 ENCOUNTER — Encounter: Payer: Self-pay | Admitting: Family Medicine

## 2015-06-30 ENCOUNTER — Ambulatory Visit (INDEPENDENT_AMBULATORY_CARE_PROVIDER_SITE_OTHER): Payer: Medicare Other | Admitting: Family Medicine

## 2015-06-30 VITALS — BP 132/74 | Temp 98.6°F | Ht 61.0 in | Wt 141.6 lb

## 2015-06-30 DIAGNOSIS — Z23 Encounter for immunization: Secondary | ICD-10-CM | POA: Diagnosis not present

## 2015-06-30 DIAGNOSIS — Z720 Tobacco use: Secondary | ICD-10-CM | POA: Diagnosis not present

## 2015-06-30 DIAGNOSIS — F17200 Nicotine dependence, unspecified, uncomplicated: Secondary | ICD-10-CM

## 2015-06-30 DIAGNOSIS — I10 Essential (primary) hypertension: Secondary | ICD-10-CM

## 2015-06-30 DIAGNOSIS — E785 Hyperlipidemia, unspecified: Secondary | ICD-10-CM

## 2015-06-30 DIAGNOSIS — Z1159 Encounter for screening for other viral diseases: Secondary | ICD-10-CM | POA: Diagnosis not present

## 2015-06-30 LAB — COMPREHENSIVE METABOLIC PANEL
ALT: 20 U/L (ref 0–35)
AST: 26 U/L (ref 0–37)
Albumin: 4.3 g/dL (ref 3.5–5.2)
Alkaline Phosphatase: 55 U/L (ref 39–117)
BUN: 8 mg/dL (ref 6–23)
CO2: 29 mEq/L (ref 19–32)
Calcium: 9.1 mg/dL (ref 8.4–10.5)
Chloride: 100 mEq/L (ref 96–112)
Creatinine, Ser: 0.57 mg/dL (ref 0.40–1.20)
GFR: 111.95 mL/min (ref >60.00)
Glucose, Bld: 79 mg/dL (ref 70–99)
Potassium: 4.7 mEq/L (ref 3.5–5.1)
Sodium: 135 mEq/L (ref 135–145)
Total Bilirubin: 0.6 mg/dL (ref 0.2–1.2)
Total Protein: 7.2 g/dL (ref 6.0–8.3)

## 2015-06-30 LAB — LIPID PANEL
Cholesterol: 190 mg/dL (ref 0–200)
HDL: 82.3 mg/dL (ref >39.00)
LDL Cholesterol: 87 mg/dL (ref 0–99)
NonHDL: 108.07
Total CHOL/HDL Ratio: 2
Triglycerides: 105 mg/dL (ref 0.0–149.0)
VLDL: 21 mg/dL (ref 0.0–40.0)

## 2015-06-30 LAB — POCT URINALYSIS DIPSTICK
Bilirubin, UA: NEGATIVE
Blood, UA: NEGATIVE
Glucose, UA: NEGATIVE
Ketones, UA: NEGATIVE
Leukocytes, UA: NEGATIVE
Nitrite, UA: NEGATIVE
Protein, UA: NEGATIVE
Spec Grav, UA: 1.015
Urobilinogen, UA: 0.2
pH, UA: 6

## 2015-06-30 LAB — MICROALBUMIN / CREATININE URINE RATIO
Creatinine,U: 40.4 mg/dL
Microalb Creat Ratio: 5.9 mg/g (ref 0.0–30.0)
Microalb, Ur: 2.4 mg/dL — ABNORMAL HIGH (ref 0.0–1.9)

## 2015-06-30 NOTE — Patient Instructions (Signed)

## 2015-06-30 NOTE — Progress Notes (Signed)
Patient ID: Pam Cameron, female    DOB: 1946/06/01  Age: 69 y.o. MRN: [id number]    Subjective:  Subjective HPI Samora A Transue presents for f/u bp and cholesterol.  No complaints.    Review of Systems  Constitutional: Negative for diaphoresis, appetite change, fatigue and unexpected weight change.  Eyes: Negative for pain, redness and visual disturbance.  Respiratory: Negative for cough, chest tightness, shortness of breath and wheezing.   Cardiovascular: Negative for chest pain, palpitations and leg swelling.  Endocrine: Negative for cold intolerance, heat intolerance, polydipsia, polyphagia and polyuria.  Genitourinary: Negative for dysuria, frequency and difficulty urinating.  Neurological: Negative for dizziness, light-headedness, numbness and headaches.    History Past Medical History  Diagnosis Date  . Hypertension   . Anemia   . Pneumonia   . Depression   . Blood transfusion   . CHF (congestive heart failure)     She has past surgical history that includes Appendectomy; Tonsillectomy; Lung surgery; and Fixation kyphoplasty lumbar spine (12/14/2011).   Her family history includes Cancer in her father and mother; Dementia in her father; Hypertension in her father and mother; Ovarian cancer in her mother; Prostate cancer in her father.She reports that she has been smoking.  She has never used smokeless tobacco. She reports that she drinks about 1.5 oz of alcohol per week. She reports that she does not use illicit drugs.  Current Outpatient Prescriptions on File Prior to Visit  Medication Sig Dispense Refill  . albuterol (PROAIR HFA) 108 (90 BASE) MCG/ACT inhaler Inhale 2 puffs into the lungs every 6 (six) hours as needed for wheezing. 1 Inhaler 2  . alendronate (FOSAMAX) 70 MG tablet TAKE 1 TABLET ONCE A WEEK WITH A FULL GLASS OF WATER ON AN EMPTY STOMACH 12 tablet 3  . amLODipine (NORVASC) 5 MG tablet Take 1 tablet (5 mg total) by mouth daily. 90 tablet 3  . BIOTIN PO  Take by mouth daily.      . clarithromycin (BIAXIN) 500 MG tablet Take 1 tablet (500 mg total) by mouth 2 (two) times daily. 14 tablet 0  . flintstones complete (FLINTSTONES) 60 MG chewable tablet Chew 1 tablet by mouth daily.      . fluticasone (FLONASE) 50 MCG/ACT nasal spray Place 2 sprays into both nostrils daily. 16 g 0  . Fluticasone-Salmeterol (ADVAIR DISKUS) 250-50 MCG/DOSE AEPB 1 inh bid 60 each   . IRON PO Take 1 tablet by mouth daily.    Marland Kitchen lisinopril (PRINIVIL,ZESTRIL) 40 MG tablet TAKE 1 TABLET BY MOUTH EVERY DAY 30 tablet 1   No current facility-administered medications on file prior to visit.     Objective:  Objective Physical Exam  Constitutional: She is oriented to person, place, and time. She appears well-developed and well-nourished.  HENT:  Head: Normocephalic and atraumatic.  Eyes: Conjunctivae and EOM are normal.  Neck: Normal range of motion. Neck supple. No JVD present. Carotid bruit is not present. No thyromegaly present.  Cardiovascular: Normal rate, regular rhythm and normal heart sounds.   No murmur heard. Pulmonary/Chest: Effort normal and breath sounds normal. No respiratory distress. She has no wheezes. She has no rales. She exhibits no tenderness.  Musculoskeletal: She exhibits no edema.  Neurological: She is alert and oriented to person, place, and time.  Psychiatric: She has a normal mood and affect. Her behavior is normal.   BP 132/74 mmHg  Temp(Src) 98.6 F (37 C) (Oral)  Ht '5\' 1"'  (1.549 m)  Wt 141 lb 9.6  oz (64.229 kg)  BMI 26.77 kg/m2 Wt Readings from Last 3 Encounters:  06/30/15 141 lb 9.6 oz (64.229 kg)  12/07/14 136 lb 6.4 oz (61.871 kg)  08/31/14 139 lb (63.05 kg)     Lab Results  Component Value Date   WBC 5.6 04/15/2013   HGB 13.9 04/15/2013   HCT 40.0 04/15/2013   PLT 216.0 04/15/2013   GLUCOSE 79 06/30/2015   CHOL 190 06/30/2015   TRIG 105.0 06/30/2015   HDL 82.30 06/30/2015   LDLCALC 87 06/30/2015   ALT 20 06/30/2015    AST 26 06/30/2015   NA 135 06/30/2015   K 4.7 06/30/2015   CL 100 06/30/2015   CREATININE 0.57 06/30/2015   BUN 8 06/30/2015   CO2 29 06/30/2015   TSH 0.77 03/24/2012   INR 0.91 12/14/2011   MICROALBUR 2.4* 06/30/2015    Dg Chest 2 View  12/07/2014   CLINICAL DATA:  Cough and chest congestion for 2 weeks. Decreased breath sounds on the right.  EXAM: CHEST  2 VIEW  COMPARISON:  05/30/2011 and CT scan dated 10/09/2005  FINDINGS: Heart size is normal. Pulmonary arteries are prominent centrally with peripheral running suggesting pulmonary arterial hypertension. The vascularity is more prominent than on the prior study.  There is chronic scarring at the lung bases. Diffuse emphysematous changes, most extensive in the upper lobes. No infiltrates or effusions. Multiple old compression fractures treated with vertebroplasty.  IMPRESSION: Emphysema. Probable pulmonary arterial hypertension. No acute abnormalities.   Electronically Signed   By: Lorriane Shire M.D.   On: 12/07/2014 17:43     Assessment & Plan:  Plan I am having Ms. Novacek maintain her flintstones complete, BIOTIN PO, IRON PO, Fluticasone-Salmeterol, fluticasone, amLODipine, clarithromycin, albuterol, alendronate, and lisinopril.  No orders of the defined types were placed in this encounter.    Problem List Items Addressed This Visit    Essential hypertension - Primary    con't lisinopril and norvasc rto 6 months      Relevant Orders   Comp Met (CMET) (Completed)   POCT urinalysis dipstick (Completed)   Microalbumin / creatinine urine ratio (Completed)    Other Visit Diagnoses    Hyperlipidemia        Relevant Orders    Lipid panel (Completed)    POCT urinalysis dipstick (Completed)    Microalbumin / creatinine urine ratio (Completed)    Need for hepatitis C screening test        Relevant Orders    Hepatitis C antibody    Tobacco use disorder        Relevant Orders    Ambulatory Referral for Lung Cancer Scre        Follow-up: Return in about 6 months (around 12/28/2015).  Garnet Koyanagi, DO

## 2015-06-30 NOTE — Progress Notes (Signed)
Pre visit review using our clinic review tool, if applicable. No additional management support is needed unless otherwise documented below in the visit note. 

## 2015-06-30 NOTE — Assessment & Plan Note (Signed)
con't lisinopril and norvasc rto 6 months

## 2015-07-01 LAB — HEPATITIS C ANTIBODY: HCV Ab: REACTIVE — AB

## 2015-07-04 LAB — HEPATITIS C RNA QUANTITATIVE: HCV Quantitative: NOT DETECTED IU/mL (ref <15)

## 2015-07-26 ENCOUNTER — Other Ambulatory Visit: Payer: Self-pay | Admitting: Acute Care

## 2015-07-26 DIAGNOSIS — F17210 Nicotine dependence, cigarettes, uncomplicated: Secondary | ICD-10-CM

## 2015-07-28 ENCOUNTER — Encounter: Payer: Self-pay | Admitting: Family Medicine

## 2015-07-31 ENCOUNTER — Emergency Department (HOSPITAL_COMMUNITY): Admission: EM | Admit: 2015-07-31 | Discharge: 2015-07-31 | Discharge status: Absent without leave | Payer: Self-pay

## 2015-08-01 ENCOUNTER — Ambulatory Visit (INDEPENDENT_AMBULATORY_CARE_PROVIDER_SITE_OTHER)
Admission: RE | Admit: 2015-08-01 | Discharge: 2015-08-01 | Disposition: A | Discharge status: Home / Self Care | Payer: Medicare Other | Source: Ambulatory Visit | Attending: Acute Care | Admitting: Acute Care

## 2015-08-01 ENCOUNTER — Encounter: Payer: Self-pay | Admitting: Acute Care

## 2015-08-01 ENCOUNTER — Ambulatory Visit (INDEPENDENT_AMBULATORY_CARE_PROVIDER_SITE_OTHER): Payer: Medicare Other | Admitting: Acute Care

## 2015-08-01 DIAGNOSIS — F17210 Nicotine dependence, cigarettes, uncomplicated: Secondary | ICD-10-CM | POA: Diagnosis not present

## 2015-08-01 NOTE — Progress Notes (Signed)
Shared Decision Making Visit Lung Cancer Screening Program [phone])   Eligibility:  Age 69 y.o.  Pack Years Smoking History Calculation 40 (# packs/per year x # years smoked)  Recent History of coughing up blood  no  Unexplained weight loss? no ( >Than 15 pounds within the last 6 months )  Prior History Lung / other cancer no (Diagnosis within the last 5 years already requiring surveillance chest CT Scans).  Smoking Status Current Smoker  Former Smokers: Years since quit: NA  Quit Date:NA  Visit Components:  Discussion included one or more decision making aids. yes  Discussion included risk/benefits of screening. yes  Discussion included potential follow up diagnostic testing for abnormal scans. yes  Discussion included meaning and risk of over diagnosis. yes  Discussion included meaning and risk of False Positives. yes  Discussion included meaning of total radiation exposure. yes  Counseling Included:  Importance of adherence to annual lung cancer LDCT screening. yes  Impact of comorbidities on ability to participate in the program. yes  Ability and willingness to under diagnostic treatment. yes  Smoking Cessation Counseling:  Current Smokers:   Discussed importance of smoking cessation. yes  Information about tobacco cessation classes and interventions provided to patient. yes  Patient provided with "ticket" for LDCT Scan. yes  Symptomatic Patient. no  Counseling:NA  Diagnosis Code: Tobacco Use Z72.0  Asymptomatic Patient yes  Counseling (Intermediate counseling: > three minutes counseling) S6015  Former Smokers:   Discussed the importance of maintaining cigarette abstinence.Current smoker  Diagnosis Code: Personal History of Nicotine Dependence. I15.379  Information about tobacco cessation classes and interventions provided to patient. Yes  Patient provided with "ticket" for LDCT Scan. yes  Written Order for Lung Cancer Screening with LDCT  placed in Epic. Yes (CT Chest Lung Cancer Screening Low Dose W/O CM) [id number] Z12.2-Screening of respiratory organs Z87.891-Personal history of nicotine dependence  I spent 15 minutes of face to face time with Ms. Oland discussing the risks and benefits of lung cancer screening.We viewed a power point together that addressed the above noted topics, pausing at intervals to allow for questions to be asked and answered to ensure understanding. We discussed that the single most powerful thing that she can do to decrease her risk of lung cancer is to quit smoking. She is not ready to set a quit date. We discussed nicotine replacement therapy, behavior modification and medication as options to use in assisting her with smoking cessation once she decides she is ready to quit smoking. I gave her the " Be stronger than your excuses card, and encouraged her to use the resources and their contact numbers to help her to quit smoking when she is ready.We discussed the time and location of her scan, and that I will call her with the results within 24 to 48 hours of getting the results. She verbalized understanding of all of the above and had no questions upon leaving the office.   Magdalen Spatz, NP

## 2015-08-02 ENCOUNTER — Telehealth: Payer: Self-pay | Admitting: Acute Care

## 2015-08-02 ENCOUNTER — Telehealth: Payer: Self-pay

## 2015-08-02 NOTE — Telephone Encounter (Signed)
I called Pam Cameron with her scan results. I explained that her scan was read as a Lung RADS 2, nodules with a very low likelihood of becoming a clinically active cancer due to lack of size or growth. I explained that the recommendation is for a repeat annual CT in 12 months. I explained that we will call her about 3 weeks prior to when the scan is due to get it scheduled.She should anticipate this being early October 2017. I also explained that there were some findings regarding compression of her spine. She was actually at the back doctor when she took the call. She has my contact information in the event she has any further questions. She verbalized understanding of all of the above.

## 2015-08-02 NOTE — Telephone Encounter (Signed)
Pt declined

## 2015-08-02 NOTE — Telephone Encounter (Signed)
Called to schedule Medicare Wellness Visit with Health Coach.  Left a message for call back.

## 2015-08-05 ENCOUNTER — Other Ambulatory Visit: Payer: Self-pay | Admitting: Neurological Surgery

## 2015-08-09 ENCOUNTER — Encounter (HOSPITAL_COMMUNITY): Payer: Self-pay

## 2015-08-09 ENCOUNTER — Other Ambulatory Visit (HOSPITAL_COMMUNITY): Payer: Self-pay | Admitting: *Deleted

## 2015-08-09 ENCOUNTER — Encounter (HOSPITAL_COMMUNITY)
Admission: RE | Admit: 2015-08-09 | Discharge: 2015-08-09 | Disposition: A | Discharge status: Home / Self Care | Payer: Medicare Other | Source: Ambulatory Visit | Attending: Neurological Surgery | Admitting: Neurological Surgery

## 2015-08-09 DIAGNOSIS — I10 Essential (primary) hypertension: Secondary | ICD-10-CM | POA: Diagnosis not present

## 2015-08-09 DIAGNOSIS — F17200 Nicotine dependence, unspecified, uncomplicated: Secondary | ICD-10-CM | POA: Diagnosis not present

## 2015-08-09 DIAGNOSIS — M8448XA Pathological fracture, other site, initial encounter for fracture: Secondary | ICD-10-CM | POA: Diagnosis not present

## 2015-08-09 DIAGNOSIS — M4854XA Collapsed vertebra, not elsewhere classified, thoracic region, initial encounter for fracture: Secondary | ICD-10-CM | POA: Diagnosis present

## 2015-08-09 DIAGNOSIS — J449 Chronic obstructive pulmonary disease, unspecified: Secondary | ICD-10-CM | POA: Diagnosis not present

## 2015-08-09 DIAGNOSIS — M1990 Unspecified osteoarthritis, unspecified site: Secondary | ICD-10-CM | POA: Diagnosis not present

## 2015-08-09 HISTORY — DX: Unspecified cataract: H26.9

## 2015-08-09 HISTORY — DX: Chronic obstructive pulmonary disease, unspecified: J44.9

## 2015-08-09 LAB — BASIC METABOLIC PANEL
Anion gap: 8 (ref 5–15)
BUN: 7 mg/dL (ref 6–20)
CO2: 26 mmol/L (ref 22–32)
Calcium: 9.2 mg/dL (ref 8.9–10.3)
Chloride: 101 mmol/L (ref 101–111)
Creatinine, Ser: 0.6 mg/dL (ref 0.44–1.00)
GFR calc Af Amer: >60 mL/min (ref >60)
GFR calc non Af Amer: >60 mL/min (ref >60)
Glucose, Bld: 85 mg/dL (ref 65–99)
Potassium: 4.6 mmol/L (ref 3.5–5.1)
Sodium: 135 mmol/L (ref 135–145)

## 2015-08-09 LAB — CBC
HCT: 40.9 % (ref 36.0–46.0)
Hemoglobin: 13.6 g/dL (ref 12.0–15.0)
MCH: 32.9 pg (ref 26.0–34.0)
MCHC: 33.3 g/dL (ref 30.0–36.0)
MCV: 98.8 fL (ref 78.0–100.0)
Platelets: 250 10*3/uL (ref 150–400)
RBC: 4.14 MIL/uL (ref 3.87–5.11)
RDW: 12.6 % (ref 11.5–15.5)
WBC: 6.7 10*3/uL (ref 4.0–10.5)

## 2015-08-09 LAB — SURGICAL PCR SCREEN
MRSA, PCR: NEGATIVE
Staphylococcus aureus: NEGATIVE

## 2015-08-09 NOTE — Pre-Procedure Instructions (Signed)
Pam Cameron  08/09/2015     Your procedure is scheduled on Wednesday, August 10, 2015 at 3:05 PM.   Report to [facility] Entrance "A" Admitting Office at 1:00 PM.   Call this number if you have problems the morning of surgery: [phone]     Remember:  Do not eat food or drink liquids after midnight tonight.  Take these medicines the morning of surgery with A SIP OF WATER: Amlodipine (Norvasc), Hydrocodone - if needed   Do not wear jewelry, make-up or nail polish.  Do not wear lotions, powders, or perfumes.  You may wear deodorant.  Do not shave 48 hours prior to surgery.    Do not bring valuables to the hospital.  [facility] is not responsible for any belongings or valuables.  Contacts, dentures or bridgework may not be worn into surgery.  Leave your suitcase in the car.  After surgery it may be brought to your room.  For patients admitted to the hospital, discharge time will be determined by your treatment team.  Patients discharged the day of surgery will not be allowed to drive home.   Special instructions:  [place] - Preparing for Surgery  Before surgery, you can play an important role.  Because skin is not sterile, your skin needs to be as free of germs as possible.  You can reduce the number of germs on you skin by washing with CHG (chlorahexidine gluconate) soap before surgery.  CHG is an antiseptic cleaner which kills germs and bonds with the skin to continue killing germs even after washing.  Please DO NOT use if you have an allergy to CHG or antibacterial soaps.  If your skin becomes reddened/irritated stop using the CHG and inform your nurse when you arrive at Short Stay.  Do not shave (including legs and underarms) for at least 48 hours prior to the first CHG shower.  You may shave your face.  Please follow these instructions carefully:   1.  Shower with CHG Soap the night before surgery and the                                morning of  Surgery.  2.  If you choose to wash your hair, wash your hair first as usual with your       normal shampoo.  3.  After you shampoo, rinse your hair and body thoroughly to remove the                      Shampoo.  4.  Use CHG as you would any other liquid soap.  You can apply chg directly       to the skin and wash gently with scrungie or a clean washcloth.  5.  Apply the CHG Soap to your body ONLY FROM THE NECK DOWN.        Do not use on open wounds or open sores.  Avoid contact with your eyes, ears, mouth and genitals (private parts).  Wash genitals (private parts) with your normal soap.  6.  Wash thoroughly, paying special attention to the area where your surgery        will be performed.  7.  Thoroughly rinse your body with warm water from the neck down.  8.  DO NOT shower/wash with your normal soap after using and rinsing off  the CHG Soap.  9.  Pat yourself dry with a clean towel.            10.  Wear clean pajamas.            11.  Place clean sheets on your bed the night of your first shower and do not        sleep with pets.  Day of Surgery  Do not apply any lotions the morning of surgery.  Please wear clean clothes to the hospital.  Please read over the following fact sheets that you were given. Pain Booklet, Coughing and Deep Breathing, MRSA Information and Surgical Site Infection Prevention

## 2015-08-09 NOTE — Progress Notes (Signed)
Pt denies cardiac history, chest pain or sob. CHF was noted in her history, but pt denies ever having CHF. Pt has a smoker's cough, sounds congested, but she states that she sounds like this all the time.   Pt's PCP is Dr. Garnet Koyanagi  Called Dr. Hewitt Shorts office for pre-op orders to be signed. Left message on Susan's voicemail.

## 2015-08-10 ENCOUNTER — Observation Stay (HOSPITAL_COMMUNITY)
Admission: RE | Admit: 2015-08-10 | Discharge: 2015-08-10 | Disposition: A | Discharge status: Home / Self Care | Payer: Medicare Other | Source: Ambulatory Visit | Attending: Neurological Surgery | Admitting: Neurological Surgery

## 2015-08-10 ENCOUNTER — Encounter (HOSPITAL_COMMUNITY): Payer: Self-pay | Admitting: *Deleted

## 2015-08-10 ENCOUNTER — Ambulatory Visit (HOSPITAL_COMMUNITY): Payer: Medicare Other | Admitting: Anesthesiology

## 2015-08-10 ENCOUNTER — Encounter (HOSPITAL_COMMUNITY)
Admission: RE | Disposition: A | Discharge status: Home / Self Care | Payer: Self-pay | Source: Ambulatory Visit | Attending: Neurological Surgery

## 2015-08-10 ENCOUNTER — Ambulatory Visit (HOSPITAL_COMMUNITY): Payer: Medicare Other

## 2015-08-10 DIAGNOSIS — M1990 Unspecified osteoarthritis, unspecified site: Secondary | ICD-10-CM | POA: Insufficient documentation

## 2015-08-10 DIAGNOSIS — J449 Chronic obstructive pulmonary disease, unspecified: Secondary | ICD-10-CM | POA: Insufficient documentation

## 2015-08-10 DIAGNOSIS — I10 Essential (primary) hypertension: Secondary | ICD-10-CM | POA: Insufficient documentation

## 2015-08-10 DIAGNOSIS — F17200 Nicotine dependence, unspecified, uncomplicated: Secondary | ICD-10-CM | POA: Insufficient documentation

## 2015-08-10 DIAGNOSIS — M8448XA Pathological fracture, other site, initial encounter for fracture: Principal | ICD-10-CM | POA: Insufficient documentation

## 2015-08-10 DIAGNOSIS — S22000A Wedge compression fracture of unspecified thoracic vertebra, initial encounter for closed fracture: Secondary | ICD-10-CM | POA: Diagnosis present

## 2015-08-10 DIAGNOSIS — Z419 Encounter for procedure for purposes other than remedying health state, unspecified: Secondary | ICD-10-CM

## 2015-08-10 HISTORY — PX: KYPHOPLASTY: SHX5884

## 2015-08-10 SURGERY — KYPHOPLASTY
Anesthesia: General

## 2015-08-10 MED ORDER — SODIUM CHLORIDE 0.9 % IJ SOLN: 3.0000 mL | Freq: Two times a day (BID) | INTRAMUSCULAR | Status: DC

## 2015-08-10 MED ORDER — METHOCARBAMOL 1000 MG/10ML IJ SOLN
500.0000 mg | Freq: Four times a day (QID) | INTRAVENOUS | Status: DC | PRN
  Filled 2015-08-10: qty 5

## 2015-08-10 MED ORDER — MIDAZOLAM HCL 5 MG/5ML IJ SOLN
INTRAMUSCULAR | Status: DC | PRN
Start: 2015-08-10 — End: 2015-08-10
  Administered 2015-08-10: 2 mg via INTRAVENOUS

## 2015-08-10 MED ORDER — DEXAMETHASONE SODIUM PHOSPHATE 4 MG/ML IJ SOLN
INTRAMUSCULAR | Status: DC | PRN
  Administered 2015-08-10: 4 mg via INTRAVENOUS

## 2015-08-10 MED ORDER — BISACODYL 10 MG RE SUPP: 10.0000 mg | Freq: Every day | RECTAL | Status: DC | PRN

## 2015-08-10 MED ORDER — SUCCINYLCHOLINE CHLORIDE 20 MG/ML IJ SOLN
INTRAMUSCULAR | Status: DC | PRN
  Administered 2015-08-10: 100 mg via INTRAVENOUS

## 2015-08-10 MED ORDER — LACTATED RINGERS IV SOLN
INTRAVENOUS | Status: DC
  Administered 2015-08-10: 14:00:00 via INTRAVENOUS

## 2015-08-10 MED ORDER — PANTOPRAZOLE SODIUM 40 MG IV SOLR: 40.0000 mg | Freq: Every day | INTRAVENOUS | Status: DC

## 2015-08-10 MED ORDER — LIDOCAINE HCL (CARDIAC) 20 MG/ML IV SOLN
INTRAVENOUS | Status: DC | PRN
  Administered 2015-08-10: 60 mg via INTRAVENOUS

## 2015-08-10 MED ORDER — LISINOPRIL 20 MG PO TABS
40.0000 mg | ORAL_TABLET | Freq: Every day | ORAL | Status: DC
  Administered 2015-08-10: 40 mg via ORAL
  Filled 2015-08-10: qty 2

## 2015-08-10 MED ORDER — ONDANSETRON HCL 4 MG/2ML IJ SOLN
INTRAMUSCULAR | Status: DC | PRN
  Administered 2015-08-10: 4 mg via INTRAVENOUS

## 2015-08-10 MED ORDER — ACETAMINOPHEN 650 MG RE SUPP: 650.0000 mg | RECTAL | Status: DC | PRN

## 2015-08-10 MED ORDER — EPHEDRINE SULFATE 50 MG/ML IJ SOLN
INTRAMUSCULAR | Status: DC | PRN
  Administered 2015-08-10 (×3): 10 mg via INTRAVENOUS
  Administered 2015-08-10: 15 mg via INTRAVENOUS

## 2015-08-10 MED ORDER — MEPERIDINE HCL 25 MG/ML IJ SOLN: 6.2500 mg | INTRAMUSCULAR | Status: DC | PRN

## 2015-08-10 MED ORDER — ARTIFICIAL TEARS OP OINT
TOPICAL_OINTMENT | OPHTHALMIC | Status: AC
  Filled 2015-08-10: qty 3.5

## 2015-08-10 MED ORDER — FENTANYL CITRATE (PF) 250 MCG/5ML IJ SOLN
INTRAMUSCULAR | Status: AC
  Filled 2015-08-10: qty 5

## 2015-08-10 MED ORDER — IOHEXOL 300 MG/ML  SOLN
INTRAMUSCULAR | Status: DC | PRN
  Administered 2015-08-10: 50 mL

## 2015-08-10 MED ORDER — PHENYLEPHRINE 40 MCG/ML (10ML) SYRINGE FOR IV PUSH (FOR BLOOD PRESSURE SUPPORT)
PREFILLED_SYRINGE | INTRAVENOUS | Status: AC
  Filled 2015-08-10: qty 20

## 2015-08-10 MED ORDER — LIDOCAINE-EPINEPHRINE 1 %-1:100000 IJ SOLN
INTRAMUSCULAR | Status: DC | PRN
  Administered 2015-08-10: 6 mL

## 2015-08-10 MED ORDER — CEFAZOLIN SODIUM-DEXTROSE 2-3 GM-% IV SOLR
2.0000 g | INTRAVENOUS | Status: AC
  Administered 2015-08-10: 2 g via INTRAVENOUS

## 2015-08-10 MED ORDER — ONDANSETRON HCL 4 MG/2ML IJ SOLN
INTRAMUSCULAR | Status: AC
  Filled 2015-08-10: qty 4

## 2015-08-10 MED ORDER — PROPOFOL 10 MG/ML IV BOLUS
INTRAVENOUS | Status: AC
  Filled 2015-08-10: qty 20

## 2015-08-10 MED ORDER — POLYETHYLENE GLYCOL 3350 17 G PO PACK: 17.0000 g | PACK | Freq: Every day | ORAL | Status: DC | PRN

## 2015-08-10 MED ORDER — LACTATED RINGERS IV SOLN
INTRAVENOUS | Status: DC | PRN
  Administered 2015-08-10: 14:00:00 via INTRAVENOUS

## 2015-08-10 MED ORDER — FLEET ENEMA 7-19 GM/118ML RE ENEM: 1.0000 | ENEMA | Freq: Once | RECTAL | Status: DC | PRN

## 2015-08-10 MED ORDER — SODIUM CHLORIDE 0.9 % IV SOLN: 250.0000 mL | INTRAVENOUS | Status: DC

## 2015-08-10 MED ORDER — ACETAMINOPHEN 325 MG PO TABS: 650.0000 mg | ORAL_TABLET | ORAL | Status: DC | PRN

## 2015-08-10 MED ORDER — CEFAZOLIN SODIUM-DEXTROSE 2-3 GM-% IV SOLR
INTRAVENOUS | Status: AC
  Filled 2015-08-10: qty 50

## 2015-08-10 MED ORDER — FLINTSTONES COMPLETE 60 MG PO CHEW: 1.0000 | CHEWABLE_TABLET | Freq: Every day | ORAL | Status: DC

## 2015-08-10 MED ORDER — HYDROMORPHONE HCL 1 MG/ML IJ SOLN: 0.2500 mg | INTRAMUSCULAR | Status: DC | PRN

## 2015-08-10 MED ORDER — BUPIVACAINE HCL (PF) 0.25 % IJ SOLN
INTRAMUSCULAR | Status: DC | PRN
  Administered 2015-08-10: 6 mL

## 2015-08-10 MED ORDER — DOCUSATE SODIUM 100 MG PO CAPS: 100.0000 mg | ORAL_CAPSULE | Freq: Two times a day (BID) | ORAL | Status: DC

## 2015-08-10 MED ORDER — SODIUM CHLORIDE 0.9 % IJ SOLN
3.0000 mL | INTRAMUSCULAR | Status: DC | PRN
Start: 2015-08-10 — End: 2015-08-10

## 2015-08-10 MED ORDER — MIDAZOLAM HCL 2 MG/2ML IJ SOLN
INTRAMUSCULAR | Status: AC
  Filled 2015-08-10: qty 4

## 2015-08-10 MED ORDER — KETOROLAC TROMETHAMINE 30 MG/ML IJ SOLN
INTRAMUSCULAR | Status: AC
  Filled 2015-08-10: qty 1

## 2015-08-10 MED ORDER — HYDROMORPHONE HCL 1 MG/ML IJ SOLN: 0.5000 mg | INTRAMUSCULAR | Status: DC | PRN

## 2015-08-10 MED ORDER — HYDROCODONE-ACETAMINOPHEN 5-325 MG PO TABS: 1.0000 | ORAL_TABLET | ORAL | Status: DC | PRN

## 2015-08-10 MED ORDER — METHOCARBAMOL 500 MG PO TABS: 500.0000 mg | ORAL_TABLET | Freq: Four times a day (QID) | ORAL | Status: DC | PRN

## 2015-08-10 MED ORDER — SODIUM CHLORIDE 0.9 % IV SOLN: INTRAVENOUS | Status: DC

## 2015-08-10 MED ORDER — OXYCODONE-ACETAMINOPHEN 5-325 MG PO TABS
1.0000 | ORAL_TABLET | ORAL | Status: DC | PRN
  Administered 2015-08-10: 2 via ORAL
  Filled 2015-08-10: qty 2

## 2015-08-10 MED ORDER — FENTANYL CITRATE (PF) 100 MCG/2ML IJ SOLN
INTRAMUSCULAR | Status: DC | PRN
  Administered 2015-08-10 (×3): 50 ug via INTRAVENOUS

## 2015-08-10 MED ORDER — CEFAZOLIN SODIUM-DEXTROSE 2-3 GM-% IV SOLR
2.0000 g | Freq: Three times a day (TID) | INTRAVENOUS | Status: DC
  Filled 2015-08-10 (×2): qty 50

## 2015-08-10 MED ORDER — PROPOFOL 10 MG/ML IV BOLUS
INTRAVENOUS | Status: DC | PRN
  Administered 2015-08-10: 50 mg via INTRAVENOUS
  Administered 2015-08-10: 140 mg via INTRAVENOUS

## 2015-08-10 MED ORDER — PHENYLEPHRINE HCL 10 MG/ML IJ SOLN
INTRAMUSCULAR | Status: AC
  Filled 2015-08-10: qty 1

## 2015-08-10 MED ORDER — SENNA 8.6 MG PO TABS: 1.0000 | ORAL_TABLET | Freq: Two times a day (BID) | ORAL | Status: DC

## 2015-08-10 MED ORDER — 0.9 % SODIUM CHLORIDE (POUR BTL) OPTIME
TOPICAL | Status: DC | PRN
  Administered 2015-08-10: 1000 mL

## 2015-08-10 MED ORDER — SUCCINYLCHOLINE CHLORIDE 20 MG/ML IJ SOLN
INTRAMUSCULAR | Status: AC
  Filled 2015-08-10: qty 1

## 2015-08-10 MED ORDER — ONDANSETRON HCL 4 MG/2ML IJ SOLN: 4.0000 mg | INTRAMUSCULAR | Status: DC | PRN

## 2015-08-10 MED ORDER — AMLODIPINE BESYLATE 5 MG PO TABS: 5.0000 mg | ORAL_TABLET | Freq: Every day | ORAL | Status: DC

## 2015-08-10 MED ORDER — MENTHOL 3 MG MT LOZG: 1.0000 | LOZENGE | OROMUCOSAL | Status: DC | PRN

## 2015-08-10 MED ORDER — PHENOL 1.4 % MT LIQD: 1.0000 | OROMUCOSAL | Status: DC | PRN

## 2015-08-10 MED ORDER — PROMETHAZINE HCL 25 MG/ML IJ SOLN: 6.2500 mg | INTRAMUSCULAR | Status: DC | PRN

## 2015-08-10 SURGICAL SUPPLY — 43 items
ADH SKN CLS APL DERMABOND .7 (GAUZE/BANDAGES/DRESSINGS) ×1
BLADE CLIPPER SURG (BLADE) IMPLANT
BLADE SURG 11 STRL SS (BLADE) ×2 IMPLANT
CHLORAPREP W/TINT 26ML (MISCELLANEOUS) ×2 IMPLANT
DERMABOND ADVANCED (GAUZE/BANDAGES/DRESSINGS) ×1
DERMABOND ADVANCED .7 DNX12 (GAUZE/BANDAGES/DRESSINGS) ×1 IMPLANT
DRAPE C-ARM 42X72 X-RAY (DRAPES) ×3 IMPLANT
DRAPE LAPAROTOMY 100X72X124 (DRAPES) ×1 IMPLANT
DRAPE PROXIMA HALF (DRAPES) ×1 IMPLANT
GAUZE SPONGE 4X4 16PLY XRAY LF (GAUZE/BANDAGES/DRESSINGS) ×2 IMPLANT
GLOVE BIOGEL PI IND STRL 7.5 (GLOVE) ×1 IMPLANT
GLOVE BIOGEL PI INDICATOR 7.5 (GLOVE) ×1
GLOVE EXAM NITRILE LRG STRL (GLOVE) IMPLANT
GLOVE EXAM NITRILE MD LF STRL (GLOVE) IMPLANT
GLOVE EXAM NITRILE XL STR (GLOVE) IMPLANT
GLOVE EXAM NITRILE XS STR PU (GLOVE) IMPLANT
GLOVE SS BIOGEL STRL SZ 7 (GLOVE) ×1 IMPLANT
GLOVE SUPERSENSE BIOGEL SZ 7 (GLOVE) ×1
GOWN STRL REUS W/ TWL LRG LVL3 (GOWN DISPOSABLE) ×2 IMPLANT
GOWN STRL REUS W/ TWL XL LVL3 (GOWN DISPOSABLE) IMPLANT
GOWN STRL REUS W/TWL 2XL LVL3 (GOWN DISPOSABLE) IMPLANT
GOWN STRL REUS W/TWL LRG LVL3 (GOWN DISPOSABLE) ×2
GOWN STRL REUS W/TWL XL LVL3 (GOWN DISPOSABLE)
KIT AUGMENTATION STABILIT VERT ×1 IMPLANT
KIT BASIN OR (CUSTOM PROCEDURE TRAY) ×2 IMPLANT
KIT ROOM TURNOVER OR (KITS) ×3 IMPLANT
NDL HYPO 25X1 1.5 SAFETY (NEEDLE) ×1 IMPLANT
NDL SPNL 22GX3.5 QUINCKE BK (NEEDLE) IMPLANT
NEEDLE HYPO 25X1 1.5 SAFETY (NEEDLE) ×2 IMPLANT
NEEDLE SPNL 22GX3.5 QUINCKE BK (NEEDLE) ×2 IMPLANT
NS IRRIG 1000ML POUR BTL (IV SOLUTION) ×2 IMPLANT
PACK SURGICAL SETUP 50X90 (CUSTOM PROCEDURE TRAY) ×2 IMPLANT
PACK UNIVERSAL I (CUSTOM PROCEDURE TRAY) ×3 IMPLANT
PAD ARMBOARD 7.5X6 YLW CONV (MISCELLANEOUS) ×8 IMPLANT
SPECIMEN JAR SMALL (MISCELLANEOUS) IMPLANT
STABILIT FRACTURE KIT ×1 IMPLANT
STAPLER SKIN PROX WIDE 3.9 (STAPLE) ×1 IMPLANT
SUT VIC AB 3-0 SH 8-18 (SUTURE) ×1 IMPLANT
SYR CONTROL 10ML LL (SYRINGE) ×3 IMPLANT
SYSTEM BONE CEMENT MIXING (KITS) ×1 IMPLANT
Stabili T Bone Cement ×1 IMPLANT
TOWEL OR 17X24 6PK STRL BLUE (TOWEL DISPOSABLE) ×2 IMPLANT
TOWEL OR 17X26 10 PK STRL BLUE (TOWEL DISPOSABLE) ×1 IMPLANT

## 2015-08-10 NOTE — Op Note (Signed)
08/10/2015  3:49 PM  PATIENT:  Pam Cameron  69 y.o. female  PRE-OPERATIVE DIAGNOSIS:  T4 and T5 compression fractures with back pain  POST-OPERATIVE DIAGNOSIS:  Same  PROCEDURE:  T4 and T5 kyphoplasties  SURGEON:  Cyndy Freeze, MD  ASSISTANTS: None  ANESTHESIA:   General  DRAINS: None  SPECIMEN:  None  INDICATION FOR PROCEDURE: 69 year old woman with fragility fracture at T4 and T5 and back pain. Patient understood the risks, benefits, and alternatives and potential outcomes and wished to proceed.  PROCEDURE DETAILS: After smooth induction of general endotracheal anesthesia the patient was turned prone on a flat Jackson table. The skin was prepped and draped in the usual sterile fashion and sterile drapes were applied.   AP and lateral fluoroscopy was used to localize an entry site for a right transpedicular approach at T4. A stab incision was made and an access needle was passed into the vertebral body via a transpedicular approach.  This was repeated at T5 as well.  Several cavities for cement were created at each level. 2.2 cm of cement was injected into the T4 body using the DFINE Kyphoplasty system.  2.5 cc was then injected into the T5 body using the DFINE Kyphoplasty system.  The injection device was removed and there were no tails observed in the pedicles.  Adequate hemostasis was obtained and both incisions were closed with Dermabond.  PATIENT DISPOSITION:  PACU   Delay start of Pharmacological VTE agent (>24hrs) due to surgical blood loss or risk of bleeding:  No

## 2015-08-10 NOTE — Anesthesia Postprocedure Evaluation (Deleted)
Anesthesia Post Note  Patient: Pam Cameron  Procedure(s) Performed: Procedure(s) (LRB): Thoracic four and thoracic five kyphoplasty (N/A)  Anesthesia type: General  Patient location: PACU  Post pain: Pain level controlled  Post assessment: Post-op Vital signs reviewed  Last Vitals: BP 159/72 mmHg  Pulse 77  Temp(Src) 36.4 C (Oral)  Resp 16  SpO2 99%  Post vital signs: Reviewed  Level of consciousness: sedated  Complications: No apparent anesthesia complications

## 2015-08-10 NOTE — Anesthesia Preprocedure Evaluation (Addendum)
Anesthesia Evaluation  Patient identified by MRN, date of birth, ID band Patient awake    Reviewed: Allergy & Precautions, NPO status , Patient's Chart, lab work & pertinent test results  Airway Mallampati: II  TM Distance: >3 FB Neck ROM: Full    Dental no notable dental hx. (+) Loose, Dental Advidsory Given, Teeth Intact   Pulmonary pneumonia, COPD, Current Smoker,    Pulmonary exam normal breath sounds clear to auscultation       Cardiovascular hypertension, Pt. on medications +CHF  Normal cardiovascular exam Rhythm:Regular Rate:Normal     Neuro/Psych PSYCHIATRIC DISORDERS Depression negative neurological ROS     GI/Hepatic negative GI ROS, Neg liver ROS,   Endo/Other  negative endocrine ROS  Renal/GU negative Renal ROS     Musculoskeletal negative musculoskeletal ROS (+)   Abdominal   Peds  Hematology  (+) anemia ,   Anesthesia Other Findings   Reproductive/Obstetrics negative OB ROS                           Anesthesia Physical Anesthesia Plan  ASA: III  Anesthesia Plan: General   Post-op Pain Management:    Induction: Intravenous  Airway Management Planned: Oral ETT  Additional Equipment:   Intra-op Plan:   Post-operative Plan: Extubation in OR  Informed Consent: I have reviewed the patients History and Physical, chart, labs and discussed the procedure including the risks, benefits and alternatives for the proposed anesthesia with the patient or authorized representative who has indicated his/her understanding and acceptance.   Dental advisory given and Dental Advisory Given  Plan Discussed with: CRNA  Anesthesia Plan Comments:        Anesthesia Quick Evaluation

## 2015-08-10 NOTE — Progress Notes (Signed)
Patient discharged to home, D/C instructions/education and Rx given to patient with daughter at bedside and they both verbalized understanding. Patient tolerated her  dinner with no problem, voiding well and walking independently on hallway. Patient D/C via wheelchair.

## 2015-08-10 NOTE — Progress Notes (Signed)
No changes Continues to complain of back pain Motor 5/5 BLE

## 2015-08-10 NOTE — H&P (Signed)
This 69 year old female presents for back pain.  History of Present Illness: 1.  back pain  I saw Kelyse Obst today in clinic.  She complains of mid thoracic back pain between her shoulder blades that has been going on for 2-3 weeks.  She states that it started after sanding furniture.  She says it is made worse by activity and rest makes it better.  She denies weakness and numbness.  She denies bowel or bladder difficulty.  She as not been to physical therapy.  She as not take NSAIDs.  She has osteoporosis and has a history of thoracic and lumbar compression fractures.  She has had vertebroplasties at three levels previously and had a good response to this.        PAST MEDICAL/SURGICAL HISTORY   (Detailed)     PAST MEDICAL HISTORY, SURGICAL HISTORY, FAMILY HISTORY, SOCIAL HISTORY AND REVIEW OF SYSTEMS I have reviewed the patient's past medical, surgical, family and social history as well as the comprehensive review of systems as included on the [facility] history form dated 08/05/2015, which I have signed.  Family History  (Detailed)   SOCIAL HISTORY  (Detailed) Tobacco use reviewed. Preferred language is Unknown.   Smoking status: Current every day smoker.  SMOKING STATUS Use Status Type Smoking Status Usage Per Day Years Used Total Pack Years  yes  Current every day smoker      TOBACCO CESSATION INFORMATION Date Order Status Description Code Tobacco Cessation Information  08/05/2015     Smoking cessation education          MEDICATIONS(added, continued or stopped this visit): Started Medication Directions Instruction Stopped   alendronate 70 mg tablet take 1 tablet by oral route  every week in the morning, at least 30 min before first food, beverage, or medication of day     biotin     08/05/2015 hydrocodone 5 mg-acetaminophen 325 mg tablet take 1 tablet by oral route  every 4 hours as needed for pain    08/05/2015 lisinopril 40 mg  tablet take 1 tablet by oral route  every day    08/05/2015 Robaxin-750 750 mg tablet take 1 tablet by oral route  every 6 hours       ALLERGIES: Ingredient Reaction Medication Name Comment  CODEINE Stomach Pain     Reviewed, updated.    Vitals Date Temp F BP Pulse Ht In Wt Lb BMI BSA Pain Score  08/05/2015  160/77 75 61 146.8 27.74  0/10     PHYSICAL EXAM General Level of Distress: no acute distress Overall Appearance: normal    Musculoskeletal Gait and Station: normal  Right Left Lower Extremity Muscle Strength: normal normal Lower Extremity Muscle Tone: normal normal  Motor Strength Lower extremity motor strength was tested in the clinically pertinent muscles.     Deep Tendon Reflexes  Right Left Patellar: normal normal Achilles: normal normal  Sensory Sensation was tested at L1 to S1.   Motor and other Tests    Right Left Clonus: normal normal     DIAGNOSTIC RESULTS I have independently reviewed a CT scan of the thoracic spine dated August 01, 2015 and AP and lateral thoracic spine films obtained August 02, 2015.  She has compression deformity at T4 and T5.  She has prior vertebral plasties at T11 and T12.    IMPRESSION Laiah Fanfan has back pain attributable to pathological compression fractures at T4 and T5 due to osteoporosis.  She is neurologically  intact to objective testing.  She wishes to have these treated.  I have proposed T4 and T5 vertebral body augmentation.We had a long discussion, in language she understands, about the details of the procedure and its risks and benefits.  We also discussed alternatives to the procedure and the risks and benefits of those.  She asked appropriate questions and wishes to proceed with surgery.

## 2015-08-10 NOTE — Discharge Summary (Signed)
Date of Admission: 08/10/15  Date of Discharge: 08/10/15  Admission Diagnosis: T4 and T5 compression fractures  Discharge diagnosis: Same  Procedure: T4 and T5 kyphoplasty  Hospital course: The patient was admitted to the hospital and taken to the OR for the above listed operation.  It was tolerated well.  After recovering from anesthesia she was discharged in stable medical and neurological condition.  Follow up: 2 weeks  Discharged meds: Resume prior meds, Norco 5 for pain.

## 2015-08-10 NOTE — Progress Notes (Signed)
Awake and alert Motor strength 5/5 BLE

## 2015-08-10 NOTE — Anesthesia Procedure Notes (Signed)
Procedure Name: Intubation Date/Time: 08/10/2015 2:50 PM Performed by: Trixie Deis A Pre-anesthesia Checklist: Patient identified, Emergency Drugs available, Suction available, Patient being monitored and Timeout performed Patient Re-evaluated:Patient Re-evaluated prior to inductionOxygen Delivery Method: Circle system utilized Preoxygenation: Pre-oxygenation with 100% oxygen Intubation Type: IV induction Ventilation: Mask ventilation without difficulty Laryngoscope Size: Mac and 3 Grade View: Grade I Tube type: Oral Tube size: 7.0 mm Number of attempts: 1 Airway Equipment and Method: Stylet Placement Confirmation: ETT inserted through vocal cords under direct vision,  positive ETCO2 and breath sounds checked- equal and bilateral Secured at: 21 cm Tube secured with: Tape Dental Injury: Teeth and Oropharynx as per pre-operative assessment

## 2015-08-10 NOTE — Transfer of Care (Signed)
Immediate Anesthesia Transfer of Care Note  Patient: Pam Cameron  Procedure(s) Performed: Procedure(s) with comments: Thoracic four and thoracic five kyphoplasty (N/A) - T4 and T5 kyphoplasties  Patient Location: PACU  Anesthesia Type:General  Level of Consciousness: awake, alert  and oriented  Airway & Oxygen Therapy: Patient Spontanous Breathing and Patient connected to nasal cannula oxygen  Post-op Assessment: Report given to RN, Post -op Vital signs reviewed and stable and Patient moving all extremities  Post vital signs: Reviewed and stable  Last Vitals:  Filed Vitals:   08/10/15 1321  BP: 159/72  Pulse: 77  Temp: 37.1 C  Resp: 16    Complications: No apparent anesthesia complications

## 2015-08-11 ENCOUNTER — Encounter (HOSPITAL_COMMUNITY): Payer: Self-pay | Admitting: Neurological Surgery

## 2015-08-11 NOTE — Anesthesia Postprocedure Evaluation (Signed)
Anesthesia Post Note  Patient: Pam Cameron  Procedure(s) Performed: Procedure(s) (LRB): Thoracic four and thoracic five kyphoplasty (N/A)  Anesthesia type: General  Patient location: PACU  Post pain: Pain level controlled  Post assessment: Post-op Vital signs reviewed  Last Vitals: BP 133/68 mmHg  Pulse 96  Temp(Src) 36.7 C (Oral)  Resp 18  SpO2 94%  Post vital signs: Reviewed  Level of consciousness: sedated  Complications: No apparent anesthesia complications

## 2015-08-18 ENCOUNTER — Other Ambulatory Visit: Payer: Self-pay | Admitting: Family Medicine

## 2015-09-06 ENCOUNTER — Encounter (HOSPITAL_COMMUNITY): Payer: Self-pay | Admitting: Neurological Surgery

## 2015-09-08 ENCOUNTER — Encounter (HOSPITAL_COMMUNITY): Payer: Self-pay | Admitting: Neurological Surgery

## 2015-11-25 ENCOUNTER — Telehealth: Payer: Self-pay | Admitting: Family Medicine

## 2015-11-25 DIAGNOSIS — I10 Essential (primary) hypertension: Secondary | ICD-10-CM

## 2015-11-25 MED ORDER — AMLODIPINE BESYLATE 5 MG PO TABS: 5.0000 mg | ORAL_TABLET | Freq: Every day | ORAL | Status: DC

## 2015-11-25 NOTE — Telephone Encounter (Signed)
Rx Faxed    KP 

## 2015-11-25 NOTE — Telephone Encounter (Signed)
Caller name: Self  Can be reached: [phone] Pharmacy:  [id number] - [place], [place] [phone] ([phone] (Fax)        Reason for call: Request refill on amLODipine (NORVASC) 5 MG tablet [[id number]]

## 2015-12-27 ENCOUNTER — Other Ambulatory Visit: Payer: Self-pay | Admitting: Acute Care

## 2015-12-27 DIAGNOSIS — F17210 Nicotine dependence, cigarettes, uncomplicated: Secondary | ICD-10-CM

## 2016-02-20 ENCOUNTER — Other Ambulatory Visit: Payer: Self-pay | Admitting: Family Medicine

## 2016-02-20 NOTE — Telephone Encounter (Signed)
30 day supply x 1 refill of amlodipine sent to pharmacy. Pt last saw PCP 06/30/15 and advised to f/u in 3 months. Pt is now past due and will need to be seen before further refills can be given. Please call pt to schedule appt. Thanks!

## 2016-02-27 ENCOUNTER — Other Ambulatory Visit: Payer: Self-pay | Admitting: Family Medicine

## 2016-02-27 NOTE — Telephone Encounter (Signed)
Refilled with #12 with o rf patiet due for labs and has not been seen since 06/30/15 due for follow up in march.

## 2016-03-02 ENCOUNTER — Telehealth: Payer: Self-pay | Admitting: Family Medicine

## 2016-03-02 MED ORDER — METOPROLOL SUCCINATE ER 25 MG PO TB24: 25.0000 mg | ORAL_TABLET | Freq: Every day | ORAL | Status: DC

## 2016-03-02 NOTE — Telephone Encounter (Signed)
Amlodipine does not cause a cough- however lisinopril does Can stop that one and start toprol xl 25 mg qd  Recheck bp 2-3 weeks

## 2016-03-02 NOTE — Telephone Encounter (Signed)
Caller name: Self  Can be [phone]  Pharmacy:  [id number] - [place], [place] [phone] (Phone) [phone] (Fax)        Reason for call: Request to stop taking amLODipine (NORVASC) 5 MG tablet [[id number]] because it makes her cough really bad. Wants an alternative to this medication

## 2016-03-02 NOTE — Telephone Encounter (Signed)
Patient has been made aware and verbalized understanding, she will stop the lisinopril and start the Toprol Xl 25.    KP

## 2016-03-02 NOTE — Telephone Encounter (Signed)
Please advise      KP 

## 2016-03-04 ENCOUNTER — Other Ambulatory Visit: Payer: Self-pay | Admitting: Family Medicine

## 2016-04-03 DIAGNOSIS — M810 Age-related osteoporosis without current pathological fracture: Secondary | ICD-10-CM | POA: Diagnosis not present

## 2016-05-02 ENCOUNTER — Telehealth: Payer: Self-pay

## 2016-05-02 NOTE — Telephone Encounter (Signed)
Patient has been made aware of her Bone Density results and said she would like to discuss further when she comes in for there apt in August.    KP

## 2016-05-02 NOTE — Telephone Encounter (Signed)
Received Bone Density Results, patient has worsening Osteoporosis. If she is taking fosamax the medication may need to be changed. Continue viramin D and Calcium daily. Message left to call the office   KP

## 2016-05-08 ENCOUNTER — Encounter: Payer: Self-pay | Admitting: Family Medicine

## 2016-05-22 ENCOUNTER — Other Ambulatory Visit: Payer: Self-pay

## 2016-05-22 MED ORDER — ALENDRONATE SODIUM 70 MG PO TABS: ORAL_TABLET | ORAL | 0 refills | Status: DC

## 2016-05-23 ENCOUNTER — Other Ambulatory Visit: Payer: Self-pay | Admitting: Family Medicine

## 2016-05-31 ENCOUNTER — Ambulatory Visit (INDEPENDENT_AMBULATORY_CARE_PROVIDER_SITE_OTHER): Payer: Medicare Other | Admitting: Family Medicine

## 2016-05-31 ENCOUNTER — Encounter: Payer: Self-pay | Admitting: Family Medicine

## 2016-05-31 VITALS — BP 150/84 | HR 74 | Temp 98.3°F | Ht 60.0 in | Wt 147.2 lb

## 2016-05-31 DIAGNOSIS — I10 Essential (primary) hypertension: Secondary | ICD-10-CM

## 2016-05-31 DIAGNOSIS — Z0000 Encounter for general adult medical examination without abnormal findings: Secondary | ICD-10-CM | POA: Diagnosis not present

## 2016-05-31 LAB — POCT URINALYSIS DIPSTICK
Bilirubin, UA: NEGATIVE
Blood, UA: NEGATIVE
Glucose, UA: NEGATIVE
Ketones, UA: NEGATIVE
Leukocytes, UA: NEGATIVE
Nitrite, UA: NEGATIVE
Protein, UA: NEGATIVE
Spec Grav, UA: 1.015
Urobilinogen, UA: 0.2
pH, UA: 6.5

## 2016-05-31 MED ORDER — METOPROLOL SUCCINATE ER 25 MG PO TB24: ORAL_TABLET | ORAL | 3 refills | Status: DC

## 2016-05-31 NOTE — Patient Instructions (Addendum)
Preventive Care for Adults, Female A healthy lifestyle and preventive care can promote health and wellness. Preventive health guidelines for women include the following key practices.  A routine yearly physical is a good way to check with your health care provider about your health and preventive screening. It is a chance to share any concerns and updates on your health and to receive a thorough exam.  Visit your dentist for a routine exam and preventive care every 6 months. Brush your teeth twice a day and floss once a day. Good oral hygiene prevents tooth decay and gum disease.  The frequency of eye exams is based on your age, health, family medical history, use of contact lenses, and other factors. Follow your health care provider's recommendations for frequency of eye exams.  Eat a healthy diet. Foods like vegetables, fruits, whole grains, low-fat dairy products, and lean protein foods contain the nutrients you need without too many calories. Decrease your intake of foods high in solid fats, added sugars, and salt. Eat the right amount of calories for you.Get information about a proper diet from your health care provider, if necessary.  Regular physical exercise is one of the most important things you can do for your health. Most adults should get at least 150 minutes of moderate-intensity exercise (any activity that increases your heart rate and causes you to sweat) each week. In addition, most adults need muscle-strengthening exercises on 2 or more days a week.  Maintain a healthy weight. The body mass index (BMI) is a screening tool to identify possible weight problems. It provides an estimate of body fat based on height and weight. Your health care provider can find your BMI and can help you achieve or maintain a healthy weight.For adults 20 years and older:  A BMI below 18.5 is considered underweight.  A BMI of 18.5 to 24.9 is normal.  A BMI of 25 to 29.9 is considered overweight.  A  BMI of 30 and above is considered obese.  Maintain normal blood lipids and cholesterol levels by exercising and minimizing your intake of saturated fat. Eat a balanced diet with plenty of fruit and vegetables. Blood tests for lipids and cholesterol should begin at age 45 and be repeated every 5 years. If your lipid or cholesterol levels are high, you are over 50, or you are at high risk for heart disease, you may need your cholesterol levels checked more frequently.Ongoing high lipid and cholesterol levels should be treated with medicines if diet and exercise are not working.  If you smoke, find out from your health care provider how to quit. If you do not use tobacco, do not start.  Lung cancer screening is recommended for adults aged 45-80 years who are at high risk for developing lung cancer because of a history of smoking. A yearly low-dose CT scan of the lungs is recommended for people who have at least a 30-pack-year history of smoking and are a current smoker or have quit within the past 15 years. A pack year of smoking is smoking an average of 1 pack of cigarettes a day for 1 year (for example: 1 pack a day for 30 years or 2 packs a day for 15 years). Yearly screening should continue until the smoker has stopped smoking for at least 15 years. Yearly screening should be stopped for people who develop a health problem that would prevent them from having lung cancer treatment.  If you are pregnant, do not drink alcohol. If you are  breastfeeding, be very cautious about drinking alcohol. If you are not pregnant and choose to drink alcohol, do not have more than 1 drink per day. One drink is considered to be 12 ounces (355 mL) of beer, 5 ounces (148 mL) of wine, or 1.5 ounces (44 mL) of liquor.  Avoid use of street drugs. Do not share needles with anyone. Ask for help if you need support or instructions about stopping the use of drugs.  High blood pressure causes heart disease and increases the risk  of stroke. Your blood pressure should be checked at least every 1 to 2 years. Ongoing high blood pressure should be treated with medicines if weight loss and exercise do not work.  If you are 55-79 years old, ask your health care provider if you should take aspirin to prevent strokes.  Diabetes screening is done by taking a blood sample to check your blood glucose level after you have not eaten for a certain period of time (fasting). If you are not overweight and you do not have risk factors for diabetes, you should be screened once every 3 years starting at age 45. If you are overweight or obese and you are 40-70 years of age, you should be screened for diabetes every year as part of your cardiovascular risk assessment.  Breast cancer screening is essential preventive care for women. You should practice "breast self-awareness." This means understanding the normal appearance and feel of your breasts and may include breast self-examination. Any changes detected, no matter how small, should be reported to a health care provider. Women in their 20s and 30s should have a clinical breast exam (CBE) by a health care provider as part of a regular health exam every 1 to 3 years. After age 40, women should have a CBE every year. Starting at age 40, women should consider having a mammogram (breast X-ray test) every year. Women who have a family history of breast cancer should talk to their health care provider about genetic screening. Women at a high risk of breast cancer should talk to their health care providers about having an MRI and a mammogram every year.  Breast cancer gene (BRCA)-related cancer risk assessment is recommended for women who have family members with BRCA-related cancers. BRCA-related cancers include breast, ovarian, tubal, and peritoneal cancers. Having family members with these cancers may be associated with an increased risk for harmful changes (mutations) in the breast cancer genes BRCA1 and  BRCA2. Results of the assessment will determine the need for genetic counseling and BRCA1 and BRCA2 testing.  Your health care provider may recommend that you be screened regularly for cancer of the pelvic organs (ovaries, uterus, and vagina). This screening involves a pelvic examination, including checking for microscopic changes to the surface of your cervix (Pap test). You may be encouraged to have this screening done every 3 years, beginning at age 21.  For women ages 30-65, health care providers may recommend pelvic exams and Pap testing every 3 years, or they may recommend the Pap and pelvic exam, combined with testing for human papilloma virus (HPV), every 5 years. Some types of HPV increase your risk of cervical cancer. Testing for HPV may also be done on women of any age with unclear Pap test results.  Other health care providers may not recommend any screening for nonpregnant women who are considered low risk for pelvic cancer and who do not have symptoms. Ask your health care provider if a screening pelvic exam is right for   you.  If you have had past treatment for cervical cancer or a condition that could lead to cancer, you need Pap tests and screening for cancer for at least 20 years after your treatment. If Pap tests have been discontinued, your risk factors (such as having a new sexual partner) need to be reassessed to determine if screening should resume. Some women have medical problems that increase the chance of getting cervical cancer. In these cases, your health care provider may recommend more frequent screening and Pap tests.  Colorectal cancer can be detected and often prevented. Most routine colorectal cancer screening begins at the age of 50 years and continues through age 75 years. However, your health care provider may recommend screening at an earlier age if you have risk factors for colon cancer. On a yearly basis, your health care provider may provide home test kits to check  for hidden blood in the stool. Use of a small camera at the end of a tube, to directly examine the colon (sigmoidoscopy or colonoscopy), can detect the earliest forms of colorectal cancer. Talk to your health care provider about this at age 50, when routine screening begins. Direct exam of the colon should be repeated every 5-10 years through age 75 years, unless early forms of precancerous polyps or small growths are found.  People who are at an increased risk for hepatitis B should be screened for this virus. You are considered at high risk for hepatitis B if:  You were born in a country where hepatitis B occurs often. Talk with your health care provider about which countries are considered high risk.  Your parents were born in a high-risk country and you have not received a shot to protect against hepatitis B (hepatitis B vaccine).  You have HIV or AIDS.  You use needles to inject street drugs.  You live with, or have sex with, someone who has hepatitis B.  You get hemodialysis treatment.  You take certain medicines for conditions like cancer, organ transplantation, and autoimmune conditions.  Hepatitis C blood testing is recommended for all people born from 1945 through 1965 and any individual with known risks for hepatitis C.  Practice safe sex. Use condoms and avoid high-risk sexual practices to reduce the spread of sexually transmitted infections (STIs). STIs include gonorrhea, chlamydia, syphilis, trichomonas, herpes, HPV, and human immunodeficiency virus (HIV). Herpes, HIV, and HPV are viral illnesses that have no cure. They can result in disability, cancer, and death.  You should be screened for sexually transmitted illnesses (STIs) including gonorrhea and chlamydia if:  You are sexually active and are younger than 24 years.  You are older than 24 years and your health care provider tells you that you are at risk for this type of infection.  Your sexual activity has changed  since you were last screened and you are at an increased risk for chlamydia or gonorrhea. Ask your health care provider if you are at risk.  If you are at risk of being infected with HIV, it is recommended that you take a prescription medicine daily to prevent HIV infection. This is called preexposure prophylaxis (PrEP). You are considered at risk if:  You are sexually active and do not regularly use condoms or know the HIV status of your partner(s).  You take drugs by injection.  You are sexually active with a partner who has HIV.  Talk with your health care provider about whether you are at high risk of being infected with HIV. If   you choose to begin PrEP, you should first be tested for HIV. You should then be tested every 3 months for as long as you are taking PrEP.  Osteoporosis is a disease in which the bones lose minerals and strength with aging. This can result in serious bone fractures or breaks. The risk of osteoporosis can be identified using a bone density scan. Women ages 67 years and over and women at risk for fractures or osteoporosis should discuss screening with their health care providers. Ask your health care provider whether you should take a calcium supplement or vitamin D to reduce the rate of osteoporosis.  Menopause can be associated with physical symptoms and risks. Hormone replacement therapy is available to decrease symptoms and risks. You should talk to your health care provider about whether hormone replacement therapy is right for you.  Use sunscreen. Apply sunscreen liberally and repeatedly throughout the day. You should seek shade when your shadow is shorter than you. Protect yourself by wearing long sleeves, pants, a wide-brimmed hat, and sunglasses year round, whenever you are outdoors.  Once a month, do a whole body skin exam, using a mirror to look at the skin on your back. Tell your health care provider of new moles, moles that have irregular borders, moles that  are larger than a pencil eraser, or moles that have changed in shape or color.  Stay current with required vaccines (immunizations).  Influenza vaccine. All adults should be immunized every year.  Tetanus, diphtheria, and acellular pertussis (Td, Tdap) vaccine. Pregnant women should receive 1 dose of Tdap vaccine during each pregnancy. The dose should be obtained regardless of the length of time since the last dose. Immunization is preferred during the 27th-36th week of gestation. An adult who has not previously received Tdap or who does not know her vaccine status should receive 1 dose of Tdap. This initial dose should be followed by tetanus and diphtheria toxoids (Td) booster doses every 10 years. Adults with an unknown or incomplete history of completing a 3-dose immunization series with Td-containing vaccines should begin or complete a primary immunization series including a Tdap dose. Adults should receive a Td booster every 10 years.  Varicella vaccine. An adult without evidence of immunity to varicella should receive 2 doses or a second dose if she has previously received 1 dose. Pregnant females who do not have evidence of immunity should receive the first dose after pregnancy. This first dose should be obtained before leaving the health care facility. The second dose should be obtained 4-8 weeks after the first dose.  Human papillomavirus (HPV) vaccine. Females aged 13-26 years who have not received the vaccine previously should obtain the 3-dose series. The vaccine is not recommended for use in pregnant females. However, pregnancy testing is not needed before receiving a dose. If a female is found to be pregnant after receiving a dose, no treatment is needed. In that case, the remaining doses should be delayed until after the pregnancy. Immunization is recommended for any person with an immunocompromised condition through the age of 61 years if she did not get any or all doses earlier. During the  3-dose series, the second dose should be obtained 4-8 weeks after the first dose. The third dose should be obtained 24 weeks after the first dose and 16 weeks after the second dose.  Zoster vaccine. One dose is recommended for adults aged 30 years or older unless certain conditions are present.  Measles, mumps, and rubella (MMR) vaccine. Adults born  before 1957 generally are considered immune to measles and mumps. Adults born in 1957 or later should have 1 or more doses of MMR vaccine unless there is a contraindication to the vaccine or there is laboratory evidence of immunity to each of the three diseases. A routine second dose of MMR vaccine should be obtained at least 28 days after the first dose for students attending postsecondary schools, health care workers, or international travelers. People who received inactivated measles vaccine or an unknown type of measles vaccine during 1963-1967 should receive 2 doses of MMR vaccine. People who received inactivated mumps vaccine or an unknown type of mumps vaccine before 1979 and are at high risk for mumps infection should consider immunization with 2 doses of MMR vaccine. For females of childbearing age, rubella immunity should be determined. If there is no evidence of immunity, females who are not pregnant should be vaccinated. If there is no evidence of immunity, females who are pregnant should delay immunization until after pregnancy. Unvaccinated health care workers born before 1957 who lack laboratory evidence of measles, mumps, or rubella immunity or laboratory confirmation of disease should consider measles and mumps immunization with 2 doses of MMR vaccine or rubella immunization with 1 dose of MMR vaccine.  Pneumococcal 13-valent conjugate (PCV13) vaccine. When indicated, a person who is uncertain of his immunization history and has no record of immunization should receive the PCV13 vaccine. All adults 65 years of age and older should receive this  vaccine. An adult aged 19 years or older who has certain medical conditions and has not been previously immunized should receive 1 dose of PCV13 vaccine. This PCV13 should be followed with a dose of pneumococcal polysaccharide (PPSV23) vaccine. Adults who are at high risk for pneumococcal disease should obtain the PPSV23 vaccine at least 8 weeks after the dose of PCV13 vaccine. Adults older than 69 years of age who have normal immune system function should obtain the PPSV23 vaccine dose at least 1 year after the dose of PCV13 vaccine.  Pneumococcal polysaccharide (PPSV23) vaccine. When PCV13 is also indicated, PCV13 should be obtained first. All adults aged 65 years and older should be immunized. An adult younger than age 65 years who has certain medical conditions should be immunized. Any person who resides in a nursing home or long-term care facility should be immunized. An adult smoker should be immunized. People with an immunocompromised condition and certain other conditions should receive both PCV13 and PPSV23 vaccines. People with human immunodeficiency virus (HIV) infection should be immunized as soon as possible after diagnosis. Immunization during chemotherapy or radiation therapy should be avoided. Routine use of PPSV23 vaccine is not recommended for American Indians, [place] Natives, or people younger than 65 years unless there are medical conditions that require PPSV23 vaccine. When indicated, people who have unknown immunization and have no record of immunization should receive PPSV23 vaccine. One-time revaccination 5 years after the first dose of PPSV23 is recommended for people aged 19-64 years who have chronic kidney failure, nephrotic syndrome, asplenia, or immunocompromised conditions. People who received 1-2 doses of PPSV23 before age 65 years should receive another dose of PPSV23 vaccine at age 65 years or later if at least 5 years have passed since the previous dose. Doses of PPSV23 are not  needed for people immunized with PPSV23 at or after age 65 years.  Meningococcal vaccine. Adults with asplenia or persistent complement component deficiencies should receive 2 doses of quadrivalent meningococcal conjugate (MenACWY-D) vaccine. The doses should be obtained   at least 2 months apart. Microbiologists working with certain meningococcal bacteria, [place] recruits, people at risk during an outbreak, and people who travel to or live in countries with a high rate of meningitis should be immunized. A first-year college student up through age 34 years who is living in a residence hall should receive a dose if she did not receive a dose on or after her 16th birthday. Adults who have certain high-risk conditions should receive one or more doses of vaccine.  Hepatitis A vaccine. Adults who wish to be protected from this disease, have certain high-risk conditions, work with hepatitis A-infected animals, work in hepatitis A research labs, or travel to or work in countries with a high rate of hepatitis A should be immunized. Adults who were previously unvaccinated and who anticipate close contact with an international adoptee during the first 60 days after arrival in the [place] from a country with a high rate of hepatitis A should be immunized.  Hepatitis B vaccine. Adults who wish to be protected from this disease, have certain high-risk conditions, may be exposed to blood or other infectious body fluids, are household contacts or sex partners of hepatitis B positive people, are clients or workers in certain care facilities, or travel to or work in countries with a high rate of hepatitis B should be immunized.  Haemophilus influenzae type b (Hib) vaccine. A previously unvaccinated person with asplenia or sickle cell disease or having a scheduled splenectomy should receive 1 dose of Hib vaccine. Regardless of previous immunization, a recipient of a hematopoietic stem cell transplant should receive a  3-dose series 6-12 months after her successful transplant. Hib vaccine is not recommended for adults with HIV infection. Preventive Services / Frequency Ages 35 to 4 years  Blood pressure check.** / Every 3-5 years.  Lipid and cholesterol check.** / Every 5 years beginning at age 60.  Clinical breast exam.** / Every 3 years for women in their 71s and 10s.  BRCA-related cancer risk assessment.** / For women who have family members with a BRCA-related cancer (breast, ovarian, tubal, or peritoneal cancers).  Pap test.** / Every 2 years from ages 76 through 26. Every 3 years starting at age 61 through age 76 or 93 with a history of 3 consecutive normal Pap tests.  HPV screening.** / Every 3 years from ages 37 through ages 60 to 51 with a history of 3 consecutive normal Pap tests.  Hepatitis C blood test.** / For any individual with known risks for hepatitis C.  Skin self-exam. / Monthly.  Influenza vaccine. / Every year.  Tetanus, diphtheria, and acellular pertussis (Tdap, Td) vaccine.** / Consult your health care provider. Pregnant women should receive 1 dose of Tdap vaccine during each pregnancy. 1 dose of Td every 10 years.  Varicella vaccine.** / Consult your health care provider. Pregnant females who do not have evidence of immunity should receive the first dose after pregnancy.  HPV vaccine. / 3 doses over 6 months, if 93 and younger. The vaccine is not recommended for use in pregnant females. However, pregnancy testing is not needed before receiving a dose.  Measles, mumps, rubella (MMR) vaccine.** / You need at least 1 dose of MMR if you were born in 1957 or later. You may also need a 2nd dose. For females of childbearing age, rubella immunity should be determined. If there is no evidence of immunity, females who are not pregnant should be vaccinated. If there is no evidence of immunity, females who are  pregnant should delay immunization until after pregnancy.  Pneumococcal  13-valent conjugate (PCV13) vaccine.** / Consult your health care provider.  Pneumococcal polysaccharide (PPSV23) vaccine.** / 1 to 2 doses if you smoke cigarettes or if you have certain conditions.  Meningococcal vaccine.** / 1 dose if you are age 68 to 8 years and a Market researcher living in a residence hall, or have one of several medical conditions, you need to get vaccinated against meningococcal disease. You may also need additional booster doses.  Hepatitis A vaccine.** / Consult your health care provider.  Hepatitis B vaccine.** / Consult your health care provider.  Haemophilus influenzae type b (Hib) vaccine.** / Consult your health care provider. Ages 7 to 53 years  Blood pressure check.** / Every year.  Lipid and cholesterol check.** / Every 5 years beginning at age 25 years.  Lung cancer screening. / Every year if you are aged 11-80 years and have a 30-pack-year history of smoking and currently smoke or have quit within the past 15 years. Yearly screening is stopped once you have quit smoking for at least 15 years or develop a health problem that would prevent you from having lung cancer treatment.  Clinical breast exam.** / Every year after age 48 years.  BRCA-related cancer risk assessment.** / For women who have family members with a BRCA-related cancer (breast, ovarian, tubal, or peritoneal cancers).  Mammogram.** / Every year beginning at age 41 years and continuing for as long as you are in good health. Consult with your health care provider.  Pap test.** / Every 3 years starting at age 65 years through age 37 or 70 years with a history of 3 consecutive normal Pap tests.  HPV screening.** / Every 3 years from ages 72 years through ages 60 to 40 years with a history of 3 consecutive normal Pap tests.  Fecal occult blood test (FOBT) of stool. / Every year beginning at age 21 years and continuing until age 5 years. You may not need to do this test if you get  a colonoscopy every 10 years.  Flexible sigmoidoscopy or colonoscopy.** / Every 5 years for a flexible sigmoidoscopy or every 10 years for a colonoscopy beginning at age 35 years and continuing until age 48 years.  Hepatitis C blood test.** / For all people born from [birth year] through 1965 and any individual with known risks for hepatitis C.  Skin self-exam. / Monthly.  Influenza vaccine. / Every year.  Tetanus, diphtheria, and acellular pertussis (Tdap/Td) vaccine.** / Consult your health care provider. Pregnant women should receive 1 dose of Tdap vaccine during each pregnancy. 1 dose of Td every 10 years.  Varicella vaccine.** / Consult your health care provider. Pregnant females who do not have evidence of immunity should receive the first dose after pregnancy.  Zoster vaccine.** / 1 dose for adults aged 30 years or older.  Measles, mumps, rubella (MMR) vaccine.** / You need at least 1 dose of MMR if you were born in 1957 or later. You may also need a second dose. For females of childbearing age, rubella immunity should be determined. If there is no evidence of immunity, females who are not pregnant should be vaccinated. If there is no evidence of immunity, females who are pregnant should delay immunization until after pregnancy.  Pneumococcal 13-valent conjugate (PCV13) vaccine.** / Consult your health care provider.  Pneumococcal polysaccharide (PPSV23) vaccine.** / 1 to 2 doses if you smoke cigarettes or if you have certain conditions.  Meningococcal vaccine.** /  Consult your health care provider.  Hepatitis A vaccine.** / Consult your health care provider.  Hepatitis B vaccine.** / Consult your health care provider.  Haemophilus influenzae type b (Hib) vaccine.** / Consult your health care provider. Ages 46 years and over  Blood pressure check.** / Every year.  Lipid and cholesterol check.** / Every 5 years beginning at age 39 years.  Lung cancer screening. / Every year if you  are aged 29-80 years and have a 30-pack-year history of smoking and currently smoke or have quit within the past 15 years. Yearly screening is stopped once you have quit smoking for at least 15 years or develop a health problem that would prevent you from having lung cancer treatment.  Clinical breast exam.** / Every year after age 60 years.  BRCA-related cancer risk assessment.** / For women who have family members with a BRCA-related cancer (breast, ovarian, tubal, or peritoneal cancers).  Mammogram.** / Every year beginning at age 60 years and continuing for as long as you are in good health. Consult with your health care provider.  Pap test.** / Every 3 years starting at age 72 years through age 60 or 15 years with 3 consecutive normal Pap tests. Testing can be stopped between 65 and 70 years with 3 consecutive normal Pap tests and no abnormal Pap or HPV tests in the past 10 years.  HPV screening.** / Every 3 years from ages 30 years through ages 20 or 26 years with a history of 3 consecutive normal Pap tests. Testing can be stopped between 65 and 70 years with 3 consecutive normal Pap tests and no abnormal Pap or HPV tests in the past 10 years.  Fecal occult blood test (FOBT) of stool. / Every year beginning at age 20 years and continuing until age 3 years. You may not need to do this test if you get a colonoscopy every 10 years.  Flexible sigmoidoscopy or colonoscopy.** / Every 5 years for a flexible sigmoidoscopy or every 10 years for a colonoscopy beginning at age 59 years and continuing until age 68 years.  Hepatitis C blood test.** / For all people born from [birth year] through 1964 and any individual with known risks for hepatitis C.  Osteoporosis screening.** / A one-time screening for women ages 19 years and over and women at risk for fractures or osteoporosis.  Skin self-exam. / Monthly.  Influenza vaccine. / Every year.  Tetanus, diphtheria, and acellular pertussis (Tdap/Td)  vaccine.** / 1 dose of Td every 10 years.  Varicella vaccine.** / Consult your health care provider.  Zoster vaccine.** / 1 dose for adults aged 69 years or older.  Pneumococcal 13-valent conjugate (PCV13) vaccine.** / Consult your health care provider.  Pneumococcal polysaccharide (PPSV23) vaccine.** / 1 dose for all adults aged 22 years and older.  Meningococcal vaccine.** / Consult your health care provider.  Hepatitis A vaccine.** / Consult your health care provider.  Hepatitis B vaccine.** / Consult your health care provider.  Haemophilus influenzae type b (Hib) vaccine.** / Consult your health care provider. ** Family history and personal history of risk and conditions may change your health care provider's recommendations.   This information is not intended to replace advice given to you by your health care provider. Make sure you discuss any questions you have with your health care provider.   Document Released: 11/20/2001 Document Revised: 10/15/2014 Document Reviewed: 02/19/2011 Elsevier Interactive Patient Education 2016 [employer].  Smoking Cessation, Tips for Success If you are ready to quit  smoking, congratulations! You have chosen to help yourself be healthier. Cigarettes bring nicotine, tar, carbon monoxide, and other irritants into your body. Your lungs, heart, and blood vessels will be able to work better without these poisons. There are many different ways to quit smoking. Nicotine gum, nicotine patches, a nicotine inhaler, or nicotine nasal spray can help with physical craving. Hypnosis, support groups, and medicines help break the habit of smoking. WHAT THINGS CAN I DO TO MAKE QUITTING EASIER?  Here are some tips to help you quit for good:  Pick a date when you will quit smoking completely. Tell all of your friends and family about your plan to quit on that date.  Do not try to slowly cut down on the number of cigarettes you are smoking. Pick a quit date and  quit smoking completely starting on that day.  Throw away all cigarettes.   Clean and remove all ashtrays from your home, work, and car.  On a card, write down your reasons for quitting. Carry the card with you and read it when you get the urge to smoke.  Cleanse your body of nicotine. Drink enough water and fluids to keep your urine clear or pale yellow. Do this after quitting to flush the nicotine from your body.  Learn to predict your moods. Do not let a bad situation be your excuse to have a cigarette. Some situations in your life might tempt you into wanting a cigarette.  Never have "just one" cigarette. It leads to wanting another and another. Remind yourself of your decision to quit.  Change habits associated with smoking. If you smoked while driving or when feeling stressed, try other activities to replace smoking. Stand up when drinking your coffee. Brush your teeth after eating. Sit in a different chair when you read the paper. Avoid alcohol while trying to quit, and try to drink fewer caffeinated beverages. Alcohol and caffeine may urge you to smoke.  Avoid foods and drinks that can trigger a desire to smoke, such as sugary or spicy foods and alcohol.  Ask people who smoke not to smoke around you.  Have something planned to do right after eating or having a cup of coffee. For example, plan to take a walk or exercise.  Try a relaxation exercise to calm you down and decrease your stress. Remember, you may be tense and nervous for the first 2 weeks after you quit, but this will pass.  Find new activities to keep your hands busy. Play with a pen, coin, or rubber band. Doodle or draw things on paper.  Brush your teeth right after eating. This will help cut down on the craving for the taste of tobacco after meals. You can also try mouthwash.   Use oral substitutes in place of cigarettes. Try using lemon drops, carrots, cinnamon sticks, or chewing gum. Keep them handy so they are  available when you have the urge to smoke.  When you have the urge to smoke, try deep breathing.  Designate your home as a nonsmoking area.  If you are a heavy smoker, ask your health care provider about a prescription for nicotine chewing gum. It can ease your withdrawal from nicotine.  Reward yourself. Set aside the cigarette money you save and buy yourself something nice.  Look for support from others. Join a support group or smoking cessation program. Ask someone at home or at work to help you with your plan to quit smoking.  Always ask yourself, "Do I need  this cigarette or is this just a reflex?" Tell yourself, "Today, I choose not to smoke," or "I do not want to smoke." You are reminding yourself of your decision to quit.  Do not replace cigarette smoking with electronic cigarettes (commonly called e-cigarettes). The safety of e-cigarettes is unknown, and some may contain harmful chemicals.  If you relapse, do not give up! Plan ahead and think about what you will do the next time you get the urge to smoke. HOW WILL I FEEL WHEN I QUIT SMOKING? You may have symptoms of withdrawal because your body is used to nicotine (the addictive substance in cigarettes). You may crave cigarettes, be irritable, feel very hungry, cough often, get headaches, or have difficulty concentrating. The withdrawal symptoms are only temporary. They are strongest when you first quit but will go away within 10-14 days. When withdrawal symptoms occur, stay in control. Think about your reasons for quitting. Remind yourself that these are signs that your body is healing and getting used to being without cigarettes. Remember that withdrawal symptoms are easier to treat than the major diseases that smoking can cause.  Even after the withdrawal is over, expect periodic urges to smoke. However, these cravings are generally short lived and will go away whether you smoke or not. Do not smoke! WHAT RESOURCES ARE AVAILABLE TO  HELP ME QUIT SMOKING? Your health care provider can direct you to community resources or hospitals for support, which may include:  Group support.  Education.  Hypnosis.  Therapy.   This information is not intended to replace advice given to you by your health care provider. Make sure you discuss any questions you have with your health care provider.   Document Released: 06/22/2004 Document Revised: 10/15/2014 Document Reviewed: 03/12/2013 Elsevier Interactive Patient Education [employer].

## 2016-05-31 NOTE — Progress Notes (Signed)
Subjective:   Pam Cameron   a 70 y.o. female who presents for Medicare Annual (Subsequent) preventive examination. She also needs her bp meds refilled  Review of Systems:   Review of Systems  Constitutional: Negative for activity change, appetite change and fatigue.  HENT: Negative for hearing loss, congestion, tinnitus and ear discharge.   Eyes: Negative for visual disturbance (see optho q1y -- vision corrected to 20/20 with glasses).  Respiratory: Negative for cough, chest tightness and shortness of breath.   Cardiovascular: Negative for chest pain, palpitations and leg swelling.  Gastrointestinal: Negative for abdominal pain, diarrhea, constipation and abdominal distention.  Genitourinary: Negative for urgency, frequency, decreased urine volume and difficulty urinating.  Musculoskeletal: Negative for back pain, arthralgias and gait problem.  Skin: Negative for color change, pallor and rash.  Neurological: Negative for dizziness, light-headedness, numbness and headaches.  Hematological: Negative for adenopathy. Does not bruise/bleed easily.  Psychiatric/Behavioral: Negative for suicidal ideas, confusion, sleep disturbance, self-injury, dysphoric mood, decreased concentration and agitation.  Pt is able to read and write and can do all ADLs No risk for falling No abuse/ violence in home          Objective:     Vitals: BP (!) 150/84 (BP Location: Left Arm, Patient Position: Sitting, Cuff Size: Normal)   Pulse 74   Temp 98.3 F (36.8 C) (Oral)   Ht 5' (1.524 m)   Wt 147 lb 3.2 oz (66.8 kg)   SpO2 97%   BMI 28.75 kg/m   Body mass index is 28.75 kg/m.  BP (!) 150/84 (BP Location: Left Arm, Patient Position: Sitting, Cuff Size: Normal)   Pulse 74   Temp 98.3 F (36.8 C) (Oral)   Ht 5' (1.524 m)   Wt 147 lb 3.2 oz (66.8 kg)   SpO2 97%   BMI 28.75 kg/m  General appearance: alert, cooperative, appears stated age and no distress Head: Normocephalic, without obvious  abnormality, atraumatic Eyes: conjunctivae/corneas clear. PERRL, EOM's intact. Fundi benign. Ears: normal TM's and external ear canals both ears Nose: Nares normal. Septum midline. Mucosa normal. No drainage or sinus tenderness. Throat: lips, mucosa, and tongue normal; teeth and gums normal Neck: no adenopathy, no carotid bruit, no JVD, supple, symmetrical, trachea midline and thyroid not enlarged, symmetric, no tenderness/mass/nodules Back: symmetric, no curvature. ROM normal. No CVA tenderness. Lungs: clear to auscultation bilaterally Breasts: normal appearance, no masses or tenderness Heart: regular rate and rhythm, S1, S2 normal, no murmur, click, rub or gallop Abdomen: soft, non-tender; bowel sounds normal; no masses,  no organomegaly Pelvic: not indicated; post-menopausal, no abnormal Pap smears in past Extremities: extremities normal, atraumatic, no cyanosis or edema Pulses: 2+ and symmetric Skin: Skin color, texture, turgor normal. No rashes or lesions Lymph nodes: Cervical, supraclavicular, and axillary nodes normal. Neurologic: Alert and oriented X 3, normal strength and tone. Normal symmetric reflexes. Normal coordination and gait Psych--no depression, no anxiety Tobacco History  Smoking Status  . Current Every Day Smoker  . Packs/day: 1.00  . Years: 40.00  . Types: Cigarettes  Smokeless Tobacco  . Never Used     Ready to quit: No Counseling given: No   Past Medical History:  Diagnosis Date  . Anemia   . Blood transfusion   . Cataracts, bilateral   . CHF (congestive heart failure) ([place])   . COPD (chronic obstructive pulmonary disease) ([place])   . Depression   . Hypertension   . Pneumonia    Past Surgical History:  Procedure Laterality  Date  . APPENDECTOMY    . ESOPHAGOGASTRODUODENOSCOPY ENDOSCOPY    . FIXATION KYPHOPLASTY LUMBAR SPINE  12/14/2011  . KYPHOPLASTY N/A 08/10/2015   Procedure: Thoracic four and thoracic five kyphoplasty;  Surgeon: Kevan Ny  Ditty, MD;  Location: [facility];  Service: Neurosurgery;  Laterality: N/A;  T4 and T5 kyphoplasties  . LUNG SURGERY     fluid removed after pneumonia  . TONSILLECTOMY    . TUBAL LIGATION     Family History  Problem Relation Age of Onset  . Ovarian cancer Mother   . Hypertension Mother   . Cancer Mother     uterine  . Prostate cancer Father   . Hypertension Father   . Cancer Father     lung  . Dementia Father    History  Sexual Activity  . Sexual activity: Yes  . Partners: Male    Outpatient Encounter Prescriptions as of 05/31/2016  Medication Sig  . alendronate (FOSAMAX) 70 MG tablet TAKE 1 TABLET BY MOUTH ONCE WEEKLY WITH A FULL GLASS OF WATER ON AN EMPTY STOMACH  . amLODipine (NORVASC) 5 MG tablet Take 1 tablet (5 mg total) by mouth daily.  Marland Kitchen BIOTIN PO Take 1 tablet by mouth daily.   . flintstones complete (FLINTSTONES) 60 MG chewable tablet Chew 1 tablet by mouth daily.    . IRON PO Take 1 tablet by mouth daily.  . metoprolol succinate (TOPROL-XL) 25 MG 24 hr tablet TAKE 1 TABLET(25 MG) BY MOUTH DAILY (Patient not taking: Reported on 05/31/2016)  . [DISCONTINUED] HYDROcodone-acetaminophen (NORCO/VICODIN) 5-325 MG tablet Take 1 tablet by mouth every 4 (four) hours as needed for moderate pain. (Patient not taking: Reported on 05/31/2016)   No facility-administered encounter medications on file as of 05/31/2016.     Activities of Daily Living In your present state of health, do you have any difficulty performing the following activities: 05/31/2016 08/09/2015  Hearing? N N  Vision? N N  Difficulty concentrating or making decisions? N N  Walking or climbing stairs? N Y  Dressing or bathing? N N  Doing errands, shopping? N N  Some recent data might be hidden    Patient Care Team: Ann Held, DO as PCP - General    Assessment:    cpe Exercise Activities and Dietary recommendations Current Exercise Habits: The patient does not participate in regular exercise  at present, Exercise limited by: None identified  Goals    None     Fall Risk Fall Risk  05/31/2016 12/07/2014 04/15/2013  Falls in the past year? No No No   Depression Screen PHQ 2/9 Scores 05/31/2016 12/07/2014 04/15/2013 03/24/2012  PHQ - 2 Score 0 0 0 0     Cognitive Testing MMSE - Mini Mental State Exam 05/31/2016  Orientation to time 5  Orientation to Place 5  Registration 3  Attention/ Calculation 5  Recall 3  Language- name 2 objects 2  Language- repeat 1  Language- follow 3 step command 3  Language- read & follow direction 1  Write a sentence 1  Copy design 1  Total score 30    Immunization History  Administered Date(s) Administered  . Pneumococcal Conjugate-13 06/30/2015  . Pneumococcal Polysaccharide-23 03/24/2012  . Tdap 10/10/2011  . Zoster 03/24/2012   Screening Tests Health Maintenance  Topic Date Due  . INFLUENZA VACCINE  05/08/2016  . MAMMOGRAM  06/08/2017  . COLONOSCOPY  10/09/2021  . TETANUS/TDAP  10/09/2021  . DEXA SCAN  Completed  . ZOSTAVAX  Completed  . Hepatitis C Screening  Completed  . PNA vac Low Risk Adult  Completed      Plan:    see AVS Ghm utd,  Flu declined During the course of the visit the patient was educated and counseled about the following appropriate screening and preventive services:   Vaccines to include Pneumoccal, Influenza, Hepatitis B, Td, Zostavax, HCV  Electrocardiogram  Cardiovascular Disease  Colorectal cancer screening  Bone density screening  Diabetes screening  Glaucoma screening  Mammography/PAP  Nutrition counseling   Patient Instructions (the written plan) was given to the patient.  1. Essential hypertension Repeat 140/84 con't metoprolol rto 6 months - CBC with Differential/Platelet - Lipid panel - POCT urinalysis dipstick - Comprehensive metabolic panel - metoprolol succinate (TOPROL-XL) 25 MG 24 hr tablet; TAKE 1 TABLET(25 MG) BY MOUTH DAILY  Dispense: 90 tablet; Refill: 3  2.  Preventative health care See above  Ann Held, DO  05/31/2016

## 2016-05-31 NOTE — Progress Notes (Signed)
Pre visit review using our clinic review tool, if applicable. No additional management support is needed unless otherwise documented below in the visit note. 

## 2016-06-01 LAB — CBC WITH DIFFERENTIAL/PLATELET
Basophils Absolute: 0.1 10*3/uL (ref 0.0–0.1)
Basophils Relative: 1.2 % (ref 0.0–3.0)
Eosinophils Absolute: 0.2 10*3/uL (ref 0.0–0.7)
Eosinophils Relative: 2.8 % (ref 0.0–5.0)
HCT: 41.3 % (ref 36.0–46.0)
Hemoglobin: 14.2 g/dL (ref 12.0–15.0)
Lymphocytes Relative: 29.8 % (ref 12.0–46.0)
Lymphs Abs: 1.7 10*3/uL (ref 0.7–4.0)
MCHC: 34.3 g/dL (ref 30.0–36.0)
MCV: 96.5 fl (ref 78.0–100.0)
Monocytes Absolute: 0.6 10*3/uL (ref 0.1–1.0)
Monocytes Relative: 10.8 % (ref 3.0–12.0)
Neutro Abs: 3.1 10*3/uL (ref 1.4–7.7)
Neutrophils Relative %: 55.4 % (ref 43.0–77.0)
Platelets: 223 10*3/uL (ref 150.0–400.0)
RBC: 4.28 Mil/uL (ref 3.87–5.11)
RDW: 14 % (ref 11.5–15.5)
WBC: 5.6 10*3/uL (ref 4.0–10.5)

## 2016-06-01 LAB — COMPREHENSIVE METABOLIC PANEL
ALT: 16 U/L (ref 0–35)
AST: 22 U/L (ref 0–37)
Albumin: 4.1 g/dL (ref 3.5–5.2)
Alkaline Phosphatase: 50 U/L (ref 39–117)
BUN: 11 mg/dL (ref 6–23)
CO2: 30 mEq/L (ref 19–32)
Calcium: 9.4 mg/dL (ref 8.4–10.5)
Chloride: 103 mEq/L (ref 96–112)
Creatinine, Ser: 0.73 mg/dL (ref 0.40–1.20)
GFR: 83.92 mL/min (ref >60.00)
Glucose, Bld: 91 mg/dL (ref 70–99)
Potassium: 5.2 mEq/L — ABNORMAL HIGH (ref 3.5–5.1)
Sodium: 137 mEq/L (ref 135–145)
Total Bilirubin: 0.4 mg/dL (ref 0.2–1.2)
Total Protein: 6.9 g/dL (ref 6.0–8.3)

## 2016-06-01 LAB — LIPID PANEL
Cholesterol: 186 mg/dL (ref 0–200)
HDL: 71.5 mg/dL (ref >39.00)
LDL Cholesterol: 87 mg/dL (ref 0–99)
NonHDL: 114.42
Total CHOL/HDL Ratio: 3
Triglycerides: 139 mg/dL (ref 0.0–149.0)
VLDL: 27.8 mg/dL (ref 0.0–40.0)

## 2016-06-08 ENCOUNTER — Other Ambulatory Visit: Payer: Self-pay

## 2016-06-08 DIAGNOSIS — E875 Hyperkalemia: Secondary | ICD-10-CM

## 2016-06-09 ENCOUNTER — Other Ambulatory Visit: Payer: Self-pay | Admitting: Family Medicine

## 2016-06-13 ENCOUNTER — Other Ambulatory Visit (INDEPENDENT_AMBULATORY_CARE_PROVIDER_SITE_OTHER): Payer: Medicare Other

## 2016-06-13 DIAGNOSIS — E875 Hyperkalemia: Secondary | ICD-10-CM

## 2016-06-13 DIAGNOSIS — Z1231 Encounter for screening mammogram for malignant neoplasm of breast: Secondary | ICD-10-CM | POA: Diagnosis not present

## 2016-06-13 LAB — POTASSIUM: Potassium: 3.8 mEq/L (ref 3.5–5.1)

## 2016-06-13 LAB — HM MAMMOGRAPHY

## 2016-06-14 NOTE — Progress Notes (Signed)
Spoke with patient. Patient voices understanding.  PC

## 2016-06-26 ENCOUNTER — Encounter: Payer: Self-pay | Admitting: Family Medicine

## 2016-06-26 DIAGNOSIS — R922 Inconclusive mammogram: Secondary | ICD-10-CM | POA: Diagnosis not present

## 2016-06-26 DIAGNOSIS — R928 Other abnormal and inconclusive findings on diagnostic imaging of breast: Secondary | ICD-10-CM | POA: Diagnosis not present

## 2016-07-20 ENCOUNTER — Encounter: Payer: Self-pay | Admitting: Family Medicine

## 2016-07-22 IMAGING — RF DG THORACIC SPINE 2V
1 series · 1 of 1 positions shown · non-contrast
Comparison: Thoracic spine radiographs 08/02/2015.

CLINICAL DATA: 69-year-old female status post T4-T5 kyphoplasty.

EXAM:
THORACIC SPINE 2 VIEWS ; DG C-ARM 61-120 MIN

[Series 1: run · 1 of 1 slices shown]
[im 1/1]
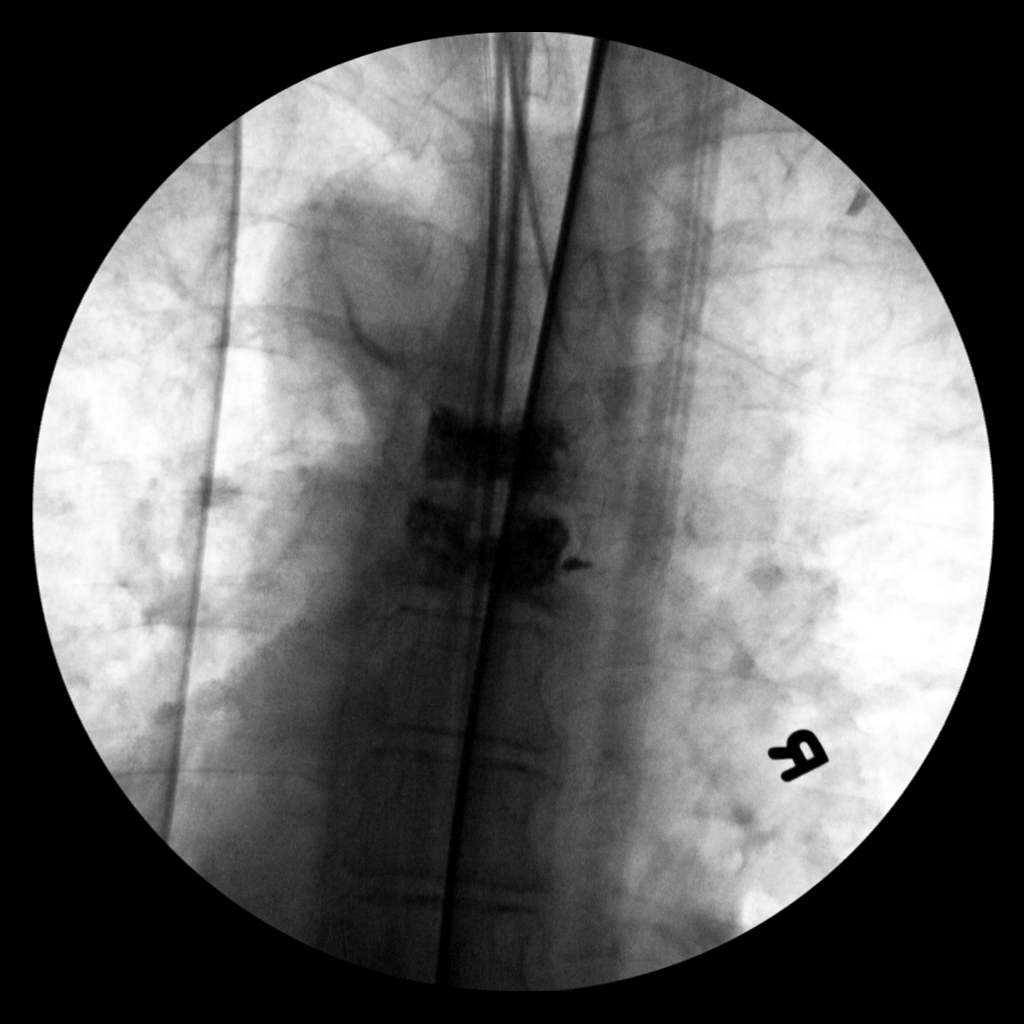

[1 of 1 positions shown; findings below may reference images not displayed]

FINDINGS: Two limited intraoperative views of the upper thoracic spine are
submitted for evaluation. These do not demonstrate the entire
thoracic spine so accurate depiction of the levels involved is not
possible on today's examination. There are postprocedural changes of
kyphoplasty in two of the vertebral bodies, which are very likely to
be T4 and T5, as indicated. There continues to be approximately 25%
loss of anterior vertebral body height in the upper thoracic
vertebral body (presumably T4), which appears unchanged compared to
the preprocedural study. At the lower vertebral body (likely T5),
there has been some restoration of height and there is no residual
anterior wedging, now with only approximately 20% loss of central
vertebral body height.
IMPRESSION: 1. Intraoperative documentation of thoracic spine kyphoplasty, as
above.

## 2016-08-01 ENCOUNTER — Ambulatory Visit (INDEPENDENT_AMBULATORY_CARE_PROVIDER_SITE_OTHER)
Admission: RE | Admit: 2016-08-01 | Discharge: 2016-08-01 | Disposition: A | Discharge status: Home / Self Care | Payer: Medicare Other | Source: Ambulatory Visit | Attending: Acute Care | Admitting: Acute Care

## 2016-08-01 DIAGNOSIS — Z87891 Personal history of nicotine dependence: Secondary | ICD-10-CM

## 2016-08-01 DIAGNOSIS — F17210 Nicotine dependence, cigarettes, uncomplicated: Secondary | ICD-10-CM

## 2016-08-10 ENCOUNTER — Telehealth: Payer: Self-pay | Admitting: Acute Care

## 2016-08-10 DIAGNOSIS — F17210 Nicotine dependence, cigarettes, uncomplicated: Secondary | ICD-10-CM

## 2016-08-10 NOTE — Telephone Encounter (Signed)
I have called Mrs. Pam Cameron's low dose screening CT results to her. I explained that her scan was read as a lung RADS 2 indicating nodules that are benign in both appearance and behavior. Recommendation is for annual screening with low-dose chest CT without contrast in 12 months. Incidental findings included aortic atherosclerosis and mild emphysema. I shared both of these incidental findings with the patient. She is currently not taking any statin medication, however she does have her cholesterol and triglycerides checked annually by her primary care physician. She states that she just had both checked and both are within normal limits. I did tell her that I would fax results to her primary care physician so that she can follow up clinically as she feels is indicated. Pam Cameron verbalized understanding of the above and had no questions at completion of the call. She has my contact information in the event she has any questions or concerns.

## 2016-08-12 ENCOUNTER — Other Ambulatory Visit: Payer: Self-pay | Admitting: Family Medicine

## 2016-09-05 DIAGNOSIS — H524 Presbyopia: Secondary | ICD-10-CM | POA: Diagnosis not present

## 2016-11-03 ENCOUNTER — Other Ambulatory Visit: Payer: Self-pay | Admitting: Family Medicine

## 2016-12-06 ENCOUNTER — Other Ambulatory Visit: Payer: Self-pay | Admitting: Family Medicine

## 2017-02-01 ENCOUNTER — Other Ambulatory Visit: Payer: Self-pay | Admitting: Family Medicine

## 2017-03-11 ENCOUNTER — Other Ambulatory Visit: Payer: Self-pay | Admitting: Family Medicine

## 2017-04-27 ENCOUNTER — Other Ambulatory Visit: Payer: Self-pay | Admitting: Family Medicine

## 2017-06-07 ENCOUNTER — Other Ambulatory Visit: Payer: Self-pay | Admitting: Family Medicine

## 2017-06-11 NOTE — Telephone Encounter (Signed)
Was advised to be seen in Feb/needs appt/thx dmf

## 2017-06-20 ENCOUNTER — Encounter: Payer: Self-pay | Admitting: Family Medicine

## 2017-06-20 ENCOUNTER — Other Ambulatory Visit: Payer: Self-pay | Admitting: Family Medicine

## 2017-06-20 ENCOUNTER — Ambulatory Visit (INDEPENDENT_AMBULATORY_CARE_PROVIDER_SITE_OTHER): Payer: Medicare Other | Admitting: Family Medicine

## 2017-06-20 VITALS — BP 126/76 | HR 73 | Temp 97.9°F | Ht 60.0 in | Wt 154.2 lb

## 2017-06-20 DIAGNOSIS — Z23 Encounter for immunization: Secondary | ICD-10-CM | POA: Diagnosis not present

## 2017-06-20 DIAGNOSIS — R600 Localized edema: Secondary | ICD-10-CM

## 2017-06-20 DIAGNOSIS — I10 Essential (primary) hypertension: Secondary | ICD-10-CM

## 2017-06-20 DIAGNOSIS — M810 Age-related osteoporosis without current pathological fracture: Secondary | ICD-10-CM | POA: Diagnosis not present

## 2017-06-20 DIAGNOSIS — Z720 Tobacco use: Secondary | ICD-10-CM

## 2017-06-20 LAB — COMPREHENSIVE METABOLIC PANEL
ALT: 17 U/L (ref 0–35)
AST: 22 U/L (ref 0–37)
Albumin: 4.3 g/dL (ref 3.5–5.2)
Alkaline Phosphatase: 51 U/L (ref 39–117)
BUN: 7 mg/dL (ref 6–23)
CO2: 31 mEq/L (ref 19–32)
Calcium: 9.4 mg/dL (ref 8.4–10.5)
Chloride: 99 mEq/L (ref 96–112)
Creatinine, Ser: 0.68 mg/dL (ref 0.40–1.20)
GFR: 90.8 mL/min (ref >60.00)
Glucose, Bld: 92 mg/dL (ref 70–99)
Potassium: 4.9 mEq/L (ref 3.5–5.1)
Sodium: 134 mEq/L — ABNORMAL LOW (ref 135–145)
Total Bilirubin: 0.7 mg/dL (ref 0.2–1.2)
Total Protein: 7 g/dL (ref 6.0–8.3)

## 2017-06-20 LAB — LIPID PANEL
Cholesterol: 188 mg/dL (ref 0–200)
HDL: 85.5 mg/dL (ref >39.00)
LDL Cholesterol: 81 mg/dL (ref 0–99)
NonHDL: 102.74
Total CHOL/HDL Ratio: 2
Triglycerides: 111 mg/dL (ref 0.0–149.0)
VLDL: 22.2 mg/dL (ref 0.0–40.0)

## 2017-06-20 LAB — CBC WITH DIFFERENTIAL/PLATELET
Basophils Absolute: 0.1 10*3/uL (ref 0.0–0.1)
Basophils Relative: 1.3 % (ref 0.0–3.0)
Eosinophils Absolute: 0.1 10*3/uL (ref 0.0–0.7)
Eosinophils Relative: 2.5 % (ref 0.0–5.0)
HCT: 45.5 % (ref 36.0–46.0)
Hemoglobin: 15.3 g/dL — ABNORMAL HIGH (ref 12.0–15.0)
Lymphocytes Relative: 22.1 % (ref 12.0–46.0)
Lymphs Abs: 1.3 10*3/uL (ref 0.7–4.0)
MCHC: 33.6 g/dL (ref 30.0–36.0)
MCV: 99.8 fl (ref 78.0–100.0)
Monocytes Absolute: 0.5 10*3/uL (ref 0.1–1.0)
Monocytes Relative: 8.8 % (ref 3.0–12.0)
Neutro Abs: 3.8 10*3/uL (ref 1.4–7.7)
Neutrophils Relative %: 65.3 % (ref 43.0–77.0)
Platelets: 211 10*3/uL (ref 150.0–400.0)
RBC: 4.56 Mil/uL (ref 3.87–5.11)
RDW: 13.4 % (ref 11.5–15.5)
WBC: 5.8 10*3/uL (ref 4.0–10.5)

## 2017-06-20 MED ORDER — METOPROLOL SUCCINATE ER 25 MG PO TB24: ORAL_TABLET | ORAL | 3 refills | Status: DC

## 2017-06-20 MED ORDER — AMLODIPINE BESYLATE 5 MG PO TABS: ORAL_TABLET | ORAL | 1 refills | Status: DC

## 2017-06-20 MED ORDER — HYDROCHLOROTHIAZIDE 25 MG PO TABS: 25.0000 mg | ORAL_TABLET | Freq: Every day | ORAL | 3 refills | Status: DC

## 2017-06-20 MED ORDER — ALENDRONATE SODIUM 70 MG PO TABS: ORAL_TABLET | ORAL | 3 refills | Status: DC

## 2017-06-20 NOTE — Assessment & Plan Note (Signed)
Pt does not want to quit

## 2017-06-20 NOTE — Addendum Note (Signed)
Addended by: Benson Setting L on: 06/20/2017 10:55 AM   Modules accepted: Orders

## 2017-06-20 NOTE — Assessment & Plan Note (Signed)
Elevate legs Compression stockings  hctz prn  Eat/ drink oj/ bananas  rto prn

## 2017-06-20 NOTE — Progress Notes (Signed)
Patient ID: Pam Cameron, female    DOB: 07/29/1946  Age: 71 y.o. MRN: [id number]    Subjective:  Subjective  HPI Pam Cameron presents for f/u bp and labs.    Review of Systems  Constitutional: Negative for activity change, appetite change, fatigue and unexpected weight change.  Respiratory: Negative for cough and shortness of breath.   Cardiovascular: Positive for leg swelling. Negative for chest pain and palpitations.  Psychiatric/Behavioral: Negative for behavioral problems and dysphoric mood. The patient is not nervous/anxious.     History Past Medical History:  Diagnosis Date  . Anemia   . Blood transfusion   . Cataracts, bilateral   . CHF (congestive heart failure) ([place])   . COPD (chronic obstructive pulmonary disease) ([place])   . Depression   . Hypertension   . Pneumonia     She has a past surgical history that includes Appendectomy; Tonsillectomy; Lung surgery; Fixation kyphoplasty lumbar spine (12/14/2011); Tubal ligation; Esophagogastroduodenoscopy endoscopy; and Kyphoplasty (N/A, 08/10/2015).   Her family history includes Cancer in her father and mother; Dementia in her father; Diabetes in her sister; Hypertension in her father and mother; Ovarian cancer in her mother; Prostate cancer in her father.She reports that she has been smoking Cigarettes.  She has a 40.00 pack-year smoking history. She has never used smokeless tobacco. She reports that she drinks about 8.4 oz of alcohol per week . She reports that she does not use drugs.  Current Outpatient Prescriptions on File Prior to Visit  Medication Sig Dispense Refill  . flintstones complete (FLINTSTONES) 60 MG chewable tablet Chew 1 tablet by mouth daily.       No current facility-administered medications on file prior to visit.      Objective:  Objective  Physical Exam  Constitutional: She is oriented to person, place, and time. She appears well-developed and well-nourished.  HENT:  Head: Normocephalic and  atraumatic.  Eyes: Conjunctivae and EOM are normal.  Neck: Normal range of motion. Neck supple. No JVD present. Carotid bruit is not present. No thyromegaly present.  Cardiovascular: Normal rate, regular rhythm and normal heart sounds.   No murmur heard. Pulmonary/Chest: Effort normal and breath sounds normal. No respiratory distress. She has no wheezes. She has no rales. She exhibits no tenderness.  Musculoskeletal: She exhibits edema.  Neurological: She is alert and oriented to person, place, and time.  Psychiatric: She has a normal mood and affect.  Nursing note and vitals reviewed.  BP 126/76 (BP Location: Left Arm, Patient Position: Sitting, Cuff Size: Normal)   Pulse 73   Temp 97.9 F (36.6 C) (Oral)   Ht 5' (1.524 m)   Wt 154 lb 3.2 oz (69.9 kg)   SpO2 94%   BMI 30.12 kg/m  Wt Readings from Last 3 Encounters:  06/20/17 154 lb 3.2 oz (69.9 kg)  05/31/16 147 lb 3.2 oz (66.8 kg)  08/09/15 148 lb 4.8 oz (67.3 kg)     Lab Results  Component Value Date   WBC 5.6 05/31/2016   HGB 14.2 05/31/2016   HCT 41.3 05/31/2016   PLT 223.0 05/31/2016   GLUCOSE 91 05/31/2016   CHOL 186 05/31/2016   TRIG 139.0 05/31/2016   HDL 71.50 05/31/2016   LDLCALC 87 05/31/2016   ALT 16 05/31/2016   AST 22 05/31/2016   NA 137 05/31/2016   K 3.8 06/13/2016   CL 103 05/31/2016   CREATININE 0.73 05/31/2016   BUN 11 05/31/2016   CO2 30 05/31/2016   TSH  0.77 03/24/2012   INR 0.91 12/14/2011   MICROALBUR 2.4 (H) 06/30/2015    Ct Chest Lung Ca Screen Low Dose W/o Cm  Result Date: 08/01/2016 CLINICAL DATA:  71 year old asymptomatic female current smoker with 40 pack-year smoking history. EXAM: CT CHEST WITHOUT CONTRAST LOW-DOSE FOR LUNG CANCER SCREENING TECHNIQUE: Multidetector CT imaging of the chest was performed following the standard protocol without IV contrast. COMPARISON:  08/01/2015 screening chest CT. FINDINGS: Cardiovascular: Normal heart size. No significant pericardial  fluid/thickening. Atherosclerotic nonaneurysmal thoracic aorta. Normal caliber pulmonary arteries. Mediastinum/Nodes: No discrete thyroid nodules. Unremarkable esophagus. No pathologically enlarged axillary, mediastinal or gross hilar lymph nodes, noting limited sensitivity for the detection of hilar adenopathy on this noncontrast study. Lungs/Pleura: No pneumothorax. No pleural effusion. Mild-to-moderate centrilobular emphysema with diffuse bronchial wall thickening. New non solid left upper lobe pulmonary nodule measuring 4.8 mm in volume derived mean diameter (series 3/ image 95). Previously described pulmonary nodules measuring up to 2.4 mm in volume derived mean diameter in the left lower lobe (series 3/ image 140) have not significantly changed. Upper abdomen: Unremarkable. Musculoskeletal: No aggressive appearing focal osseous lesions. Moderate thoracic spondylosis. New vertebroplasty material is seen within the T4 and T5 mild vertebral compression fractures. Stable vertebroplasty material within the mild chronic T11 and T12 vertebral compression fractures. IMPRESSION: 1. Lung-RADS Category 2, benign appearance or behavior. Continue annual screening with low-dose chest CT without contrast in 12 months. 2. Aortic atherosclerosis. 3. Mild-to-moderate centrilobular emphysema with diffuse bronchial wall thickening, suggesting COPD. Electronically Signed   By: Ilona Sorrel M.D.   On: 08/01/2016 12:11     Assessment & Plan:  Plan  I have discontinued Ms. Leyda's BIOTIN PO and IRON PO. I am also having her start on hydrochlorothiazide. Additionally, I am having her maintain her flintstones complete, amLODipine, alendronate, and metoprolol succinate.  Meds ordered this encounter  Medications  . amLODipine (NORVASC) 5 MG tablet    Sig: TAKE 1 TABLET(5 MG) BY MOUTH DAILY    Dispense:  90 tablet    Refill:  1  . alendronate (FOSAMAX) 70 MG tablet    Sig: TAKE 1 TABLET BY MOUTH ONCE A WEEK WITH A FULL  GLASS OF WATER ON AN EMPTY STOMACH    Dispense:  12 tablet    Refill:  3  . metoprolol succinate (TOPROL-XL) 25 MG 24 hr tablet    Sig: TAKE 1 TABLET(25 MG) BY MOUTH DAILY    Dispense:  90 tablet    Refill:  3  . hydrochlorothiazide (HYDRODIURIL) 25 MG tablet    Sig: Take 1 tablet (25 mg total) by mouth daily.    Dispense:  90 tablet    Refill:  3    Problem List Items Addressed This Visit      Unprioritized   Essential hypertension    Well controlled, no changes to meds. Encouraged heart healthy diet such as the DASH diet and exercise as tolerated.       Relevant Medications   amLODipine (NORVASC) 5 MG tablet   metoprolol succinate (TOPROL-XL) 25 MG 24 hr tablet   hydrochlorothiazide (HYDRODIURIL) 25 MG tablet   Other Relevant Orders   Lipid panel   CBC with Differential/Platelet   Comprehensive metabolic panel   Lower extremity edema    Elevate legs Compression stockings  hctz prn  Eat/ drink oj/ bananas  rto prn       Relevant Medications   hydrochlorothiazide (HYDRODIURIL) 25 MG tablet   Tobacco abuse    Pt  does not want to quit        Other Visit Diagnoses    Osteoporosis, unspecified osteoporosis type, unspecified pathological fracture presence    -  Primary   Relevant Medications   amLODipine (NORVASC) 5 MG tablet   alendronate (FOSAMAX) 70 MG tablet      Follow-up: Return in about 6 months (around 12/18/2017) for hypertension.  Ann Held, DO

## 2017-06-20 NOTE — Patient Instructions (Signed)

## 2017-06-20 NOTE — Assessment & Plan Note (Signed)
Well controlled, no changes to meds. Encouraged heart healthy diet such as the DASH diet and exercise as tolerated.  °

## 2017-07-02 DIAGNOSIS — Z1231 Encounter for screening mammogram for malignant neoplasm of breast: Secondary | ICD-10-CM | POA: Diagnosis not present

## 2017-08-02 ENCOUNTER — Ambulatory Visit (INDEPENDENT_AMBULATORY_CARE_PROVIDER_SITE_OTHER)
Admission: RE | Admit: 2017-08-02 | Discharge: 2017-08-02 | Disposition: A | Discharge status: Home / Self Care | Payer: Medicare Other | Source: Ambulatory Visit | Attending: Acute Care | Admitting: Acute Care

## 2017-08-02 DIAGNOSIS — F17210 Nicotine dependence, cigarettes, uncomplicated: Secondary | ICD-10-CM

## 2017-08-02 DIAGNOSIS — Z87891 Personal history of nicotine dependence: Secondary | ICD-10-CM | POA: Diagnosis not present

## 2017-08-08 ENCOUNTER — Other Ambulatory Visit: Payer: Self-pay | Admitting: Acute Care

## 2017-08-08 DIAGNOSIS — F17210 Nicotine dependence, cigarettes, uncomplicated: Secondary | ICD-10-CM

## 2017-08-08 DIAGNOSIS — Z122 Encounter for screening for malignant neoplasm of respiratory organs: Secondary | ICD-10-CM

## 2017-08-22 ENCOUNTER — Other Ambulatory Visit: Payer: Self-pay | Admitting: Family Medicine

## 2017-09-06 DIAGNOSIS — H524 Presbyopia: Secondary | ICD-10-CM | POA: Diagnosis not present

## 2017-11-21 DIAGNOSIS — H2512 Age-related nuclear cataract, left eye: Secondary | ICD-10-CM | POA: Diagnosis not present

## 2017-11-21 DIAGNOSIS — H25012 Cortical age-related cataract, left eye: Secondary | ICD-10-CM | POA: Diagnosis not present

## 2017-11-21 DIAGNOSIS — H25812 Combined forms of age-related cataract, left eye: Secondary | ICD-10-CM | POA: Diagnosis not present

## 2017-12-05 DIAGNOSIS — H25811 Combined forms of age-related cataract, right eye: Secondary | ICD-10-CM | POA: Diagnosis not present

## 2017-12-05 DIAGNOSIS — H2511 Age-related nuclear cataract, right eye: Secondary | ICD-10-CM | POA: Diagnosis not present

## 2017-12-05 DIAGNOSIS — H25011 Cortical age-related cataract, right eye: Secondary | ICD-10-CM | POA: Diagnosis not present

## 2017-12-14 ENCOUNTER — Other Ambulatory Visit: Payer: Self-pay | Admitting: Family Medicine

## 2017-12-14 DIAGNOSIS — M810 Age-related osteoporosis without current pathological fracture: Secondary | ICD-10-CM

## 2018-03-18 ENCOUNTER — Other Ambulatory Visit: Payer: Self-pay | Admitting: Family Medicine

## 2018-03-18 DIAGNOSIS — M810 Age-related osteoporosis without current pathological fracture: Secondary | ICD-10-CM

## 2018-03-20 ENCOUNTER — Telehealth: Payer: Self-pay | Admitting: *Deleted

## 2018-03-20 NOTE — Telephone Encounter (Signed)
Received Physician Orders from [facility]; forwarded to provider/SLS 06/13

## 2018-03-24 ENCOUNTER — Ambulatory Visit: Payer: Medicare Other | Admitting: Family Medicine

## 2018-03-24 ENCOUNTER — Encounter: Payer: Self-pay | Admitting: Family Medicine

## 2018-03-24 VITALS — BP 152/61 | HR 62 | Temp 97.9°F | Resp 16 | Ht 60.0 in | Wt 153.8 lb

## 2018-03-24 DIAGNOSIS — J441 Chronic obstructive pulmonary disease with (acute) exacerbation: Secondary | ICD-10-CM

## 2018-03-24 DIAGNOSIS — M810 Age-related osteoporosis without current pathological fracture: Secondary | ICD-10-CM | POA: Diagnosis not present

## 2018-03-24 DIAGNOSIS — E785 Hyperlipidemia, unspecified: Secondary | ICD-10-CM | POA: Diagnosis not present

## 2018-03-24 DIAGNOSIS — J449 Chronic obstructive pulmonary disease, unspecified: Secondary | ICD-10-CM

## 2018-03-24 DIAGNOSIS — R600 Localized edema: Secondary | ICD-10-CM

## 2018-03-24 DIAGNOSIS — Z720 Tobacco use: Secondary | ICD-10-CM | POA: Diagnosis not present

## 2018-03-24 DIAGNOSIS — I10 Essential (primary) hypertension: Secondary | ICD-10-CM | POA: Diagnosis not present

## 2018-03-24 LAB — LIPID PANEL
Cholesterol: 202 mg/dL — ABNORMAL HIGH (ref 0–200)
HDL: 66 mg/dL (ref >39.00)
LDL Cholesterol: 111 mg/dL — ABNORMAL HIGH (ref 0–99)
NonHDL: 136.33
Total CHOL/HDL Ratio: 3
Triglycerides: 126 mg/dL (ref 0.0–149.0)
VLDL: 25.2 mg/dL (ref 0.0–40.0)

## 2018-03-24 LAB — COMPREHENSIVE METABOLIC PANEL
ALT: 19 U/L (ref 0–35)
AST: 21 U/L (ref 0–37)
Albumin: 4.2 g/dL (ref 3.5–5.2)
Alkaline Phosphatase: 55 U/L (ref 39–117)
BUN: 9 mg/dL (ref 6–23)
CO2: 31 mEq/L (ref 19–32)
Calcium: 9.6 mg/dL (ref 8.4–10.5)
Chloride: 100 mEq/L (ref 96–112)
Creatinine, Ser: 0.67 mg/dL (ref 0.40–1.20)
GFR: 92.16 mL/min (ref >60.00)
Glucose, Bld: 93 mg/dL (ref 70–99)
Potassium: 4.8 mEq/L (ref 3.5–5.1)
Sodium: 137 mEq/L (ref 135–145)
Total Bilirubin: 0.8 mg/dL (ref 0.2–1.2)
Total Protein: 6.8 g/dL (ref 6.0–8.3)

## 2018-03-24 MED ORDER — AMLODIPINE BESYLATE 10 MG PO TABS
10.0000 mg | ORAL_TABLET | Freq: Every day | ORAL | 3 refills | Status: DC
Start: 2018-03-24 — End: 2018-09-24

## 2018-03-24 MED ORDER — METOPROLOL SUCCINATE ER 25 MG PO TB24: ORAL_TABLET | ORAL | 3 refills | Status: DC

## 2018-03-24 MED ORDER — HYDROCHLOROTHIAZIDE 25 MG PO TABS: 25.0000 mg | ORAL_TABLET | Freq: Every day | ORAL | 3 refills | Status: DC

## 2018-03-24 MED ORDER — BUDESONIDE-FORMOTEROL FUMARATE 80-4.5 MCG/ACT IN AERO: 2.0000 | INHALATION_SPRAY | Freq: Two times a day (BID) | RESPIRATORY_TRACT | 3 refills | Status: DC

## 2018-03-24 MED ORDER — ALENDRONATE SODIUM 70 MG PO TABS: ORAL_TABLET | ORAL | 3 refills | Status: DC

## 2018-03-24 NOTE — Patient Instructions (Signed)
DASH Eating Plan DASH stands for "Dietary Approaches to Stop Hypertension." The DASH eating plan is a healthy eating plan that has been shown to reduce high blood pressure (hypertension). It may also reduce your risk for type 2 diabetes, heart disease, and stroke. The DASH eating plan may also help with weight loss. What are tips for following this plan? General guidelines  Avoid eating more than 2,300 mg (milligrams) of salt (sodium) a day. If you have hypertension, you may need to reduce your sodium intake to 1,500 mg a day.  Limit alcohol intake to no more than 1 drink a day for nonpregnant women and 2 drinks a day for men. One drink equals 12 oz of beer, 5 oz of wine, or 1 oz of hard liquor.  Work with your health care provider to maintain a healthy body weight or to lose weight. Ask what an ideal weight is for you.  Get at least 30 minutes of exercise that causes your heart to beat faster (aerobic exercise) most days of the week. Activities may include walking, swimming, or biking.  Work with your health care provider or diet and nutrition specialist (dietitian) to adjust your eating plan to your individual calorie needs. Reading food labels  Check food labels for the amount of sodium per serving. Choose foods with less than 5 percent of the Daily Value of sodium. Generally, foods with less than 300 mg of sodium per serving fit into this eating plan.  To find whole grains, look for the word "whole" as the first word in the ingredient list. Shopping  Buy products labeled as "low-sodium" or "no salt added."  Buy fresh foods. Avoid canned foods and premade or frozen meals. Cooking  Avoid adding salt when cooking. Use salt-free seasonings or herbs instead of table salt or sea salt. Check with your health care provider or pharmacist before using salt substitutes.  Do not fry foods. Cook foods using healthy methods such as baking, boiling, grilling, and broiling instead.  Cook with  heart-healthy oils, such as olive, canola, soybean, or sunflower oil. Meal planning   Eat a balanced diet that includes: ? 5 or more servings of fruits and vegetables each day. At each meal, try to fill half of your plate with fruits and vegetables. ? Up to 6-8 servings of whole grains each day. ? Less than 6 oz of lean meat, poultry, or fish each day. A 3-oz serving of meat is about the same size as a deck of cards. One egg equals 1 oz. ? 2 servings of low-fat dairy each day. ? A serving of nuts, seeds, or beans 5 times each week. ? Heart-healthy fats. Healthy fats called Omega-3 fatty acids are found in foods such as flaxseeds and coldwater fish, like sardines, salmon, and mackerel.  Limit how much you eat of the following: ? Canned or prepackaged foods. ? Food that is high in trans fat, such as fried foods. ? Food that is high in saturated fat, such as fatty meat. ? Sweets, desserts, sugary drinks, and other foods with added sugar. ? Full-fat dairy products.  Do not salt foods before eating.  Try to eat at least 2 vegetarian meals each week.  Eat more home-cooked food and less restaurant, buffet, and fast food.  When eating at a restaurant, ask that your food be prepared with less salt or no salt, if possible. What foods are recommended? The items listed may not be a complete list. Talk with your dietitian about what   dietary choices are best for you. Grains Whole-grain or whole-wheat bread. Whole-grain or whole-wheat pasta. Brown rice. Oatmeal. Quinoa. Bulgur. Whole-grain and low-sodium cereals. Pita bread. Low-fat, low-sodium crackers. Whole-wheat flour tortillas. Vegetables Fresh or frozen vegetables (raw, steamed, roasted, or grilled). Low-sodium or reduced-sodium tomato and vegetable juice. Low-sodium or reduced-sodium tomato sauce and tomato paste. Low-sodium or reduced-sodium canned vegetables. Fruits All fresh, dried, or frozen fruit. Canned fruit in natural juice (without  added sugar). Meat and other protein foods Skinless chicken or turkey. Ground chicken or turkey. Pork with fat trimmed off. Fish and seafood. Egg whites. Dried beans, peas, or lentils. Unsalted nuts, nut butters, and seeds. Unsalted canned beans. Lean cuts of beef with fat trimmed off. Low-sodium, lean deli meat. Dairy Low-fat (1%) or fat-free (skim) milk. Fat-free, low-fat, or reduced-fat cheeses. Nonfat, low-sodium ricotta or cottage cheese. Low-fat or nonfat yogurt. Low-fat, low-sodium cheese. Fats and oils Soft margarine without trans fats. Vegetable oil. Low-fat, reduced-fat, or light mayonnaise and salad dressings (reduced-sodium). Canola, safflower, olive, soybean, and sunflower oils. Avocado. Seasoning and other foods Herbs. Spices. Seasoning mixes without salt. Unsalted popcorn and pretzels. Fat-free sweets. What foods are not recommended? The items listed may not be a complete list. Talk with your dietitian about what dietary choices are best for you. Grains Baked goods made with fat, such as croissants, muffins, or some breads. Dry pasta or rice meal packs. Vegetables Creamed or fried vegetables. Vegetables in a cheese sauce. Regular canned vegetables (not low-sodium or reduced-sodium). Regular canned tomato sauce and paste (not low-sodium or reduced-sodium). Regular tomato and vegetable juice (not low-sodium or reduced-sodium). Pickles. Olives. Fruits Canned fruit in a light or heavy syrup. Fried fruit. Fruit in cream or butter sauce. Meat and other protein foods Fatty cuts of meat. Ribs. Fried meat. Bacon. Sausage. Bologna and other processed lunch meats. Salami. Fatback. Hotdogs. Bratwurst. Salted nuts and seeds. Canned beans with added salt. Canned or smoked fish. Whole eggs or egg yolks. Chicken or turkey with skin. Dairy Whole or 2% milk, cream, and half-and-half. Whole or full-fat cream cheese. Whole-fat or sweetened yogurt. Full-fat cheese. Nondairy creamers. Whipped toppings.  Processed cheese and cheese spreads. Fats and oils Butter. Stick margarine. Lard. Shortening. Ghee. Bacon fat. Tropical oils, such as coconut, palm kernel, or palm oil. Seasoning and other foods Salted popcorn and pretzels. Onion salt, garlic salt, seasoned salt, table salt, and sea salt. Worcestershire sauce. Tartar sauce. Barbecue sauce. Teriyaki sauce. Soy sauce, including reduced-sodium. Steak sauce. Canned and packaged gravies. Fish sauce. Oyster sauce. Cocktail sauce. Horseradish that you find on the shelf. Ketchup. Mustard. Meat flavorings and tenderizers. Bouillon cubes. Hot sauce and Tabasco sauce. Premade or packaged marinades. Premade or packaged taco seasonings. Relishes. Regular salad dressings. Where to find more information:  National Heart, Lung, and Blood Institute: www.nhlbi.nih.gov  American Heart Association: www.heart.org Summary  The DASH eating plan is a healthy eating plan that has been shown to reduce high blood pressure (hypertension). It may also reduce your risk for type 2 diabetes, heart disease, and stroke.  With the DASH eating plan, you should limit salt (sodium) intake to 2,300 mg a day. If you have hypertension, you may need to reduce your sodium intake to 1,500 mg a day.  When on the DASH eating plan, aim to eat more fresh fruits and vegetables, whole grains, lean proteins, low-fat dairy, and heart-healthy fats.  Work with your health care provider or diet and nutrition specialist (dietitian) to adjust your eating plan to your individual   calorie needs. This information is not intended to replace advice given to you by your health care provider. Make sure you discuss any questions you have with your health care provider. Document Released: 09/13/2011 Document Revised: 09/17/2016 Document Reviewed: 09/17/2016 Elsevier Interactive Patient Education  2018 [employer]  

## 2018-03-24 NOTE — Assessment & Plan Note (Signed)
symbicort 2 puffs bid -- pt instructed on use And to brush tongue and rinse mouth with water

## 2018-03-24 NOTE — Assessment & Plan Note (Signed)
Pt does not want to quit Has ct scheduled

## 2018-03-24 NOTE — Progress Notes (Signed)
Patient ID: Pam Cameron, female   DOB: 07/28/1946, 72 y.o.   MRN: [id number]    Subjective:  I acted as a Education administrator for Dr. Carollee Herter.  Guerry Bruin, [place]   Patient ID: Pam Cameron, female    DOB: 09-12-1946, 72 y.o.   MRN: [id number]  Chief Complaint  Patient presents with  . Hypertension    HPI  Patient is in today for follow up blood pressure.  Pt with no complaints.  She needs refills on all her meds and has BMD scheduled soon.    Patient Care Team: Carollee Herter, Alferd Apa, DO as PCP - General   Past Medical History:  Diagnosis Date  . Anemia   . Blood transfusion   . Cataracts, bilateral   . CHF (congestive heart failure) ([place])   . COPD (chronic obstructive pulmonary disease) ([place])   . Depression   . Hypertension   . Pneumonia     Past Surgical History:  Procedure Laterality Date  . APPENDECTOMY    . ESOPHAGOGASTRODUODENOSCOPY ENDOSCOPY    . FIXATION KYPHOPLASTY LUMBAR SPINE  12/14/2011  . KYPHOPLASTY N/A 08/10/2015   Procedure: Thoracic four and thoracic five kyphoplasty;  Surgeon: Kevan Ny Ditty, MD;  Location: [facility];  Service: Neurosurgery;  Laterality: N/A;  T4 and T5 kyphoplasties  . LUNG SURGERY     fluid removed after pneumonia  . TONSILLECTOMY    . TUBAL LIGATION      Family History  Problem Relation Age of Onset  . Ovarian cancer Mother   . Hypertension Mother   . Cancer Mother        uterine  . Prostate cancer Father   . Hypertension Father   . Cancer Father        lung  . Dementia Father   . Diabetes Sister     Social History   Socioeconomic History  . Marital status: Single    Spouse name: Not on file  . Number of children: 0  . Years of education: Not on file  . Highest education level: Not on file  Occupational History  . Occupation: Scientist, clinical (histocompatibility and immunogenetics): [employer]  Social Needs  . Financial resource strain: Not on file  . Food insecurity:    Worry: Not on file    Inability: Not on file  . Transportation needs:    Medical: Not on file    Non-medical: Not on file  Tobacco Use  . Smoking status: Current Every Day Smoker    Packs/day: 1.00    Years: 40.00    Pack years: 40.00    Types: Cigarettes  . Smokeless tobacco: Never Used  . Tobacco comment: pt does not want to quit  Substance and Sexual Activity  . Alcohol use: Yes    Alcohol/week: 8.4 oz    Types: 14 Standard drinks or equivalent per week  . Drug use: No  . Sexual activity: Yes    Partners: Male  Lifestyle  . Physical activity:    Days per week: Not on file    Minutes per session: Not on file  . Stress: Not on file  Relationships  . Social connections:    Talks on phone: Not on file    Gets together: Not on file    Attends religious service: Not on file    Active member of club or organization: Not on file    Attends meetings of clubs or organizations: Not on file    Relationship status: Not  on file  . Intimate partner violence:    Fear of current or ex partner: Not on file    Emotionally abused: Not on file    Physically abused: Not on file    Forced sexual activity: Not on file  Other Topics Concern  . Not on file  Social History Narrative   Exercise--no    Outpatient Medications Prior to Visit  Medication Sig Dispense Refill  . flintstones complete (FLINTSTONES) 60 MG chewable tablet Chew 1 tablet by mouth daily.      Marland Kitchen alendronate (FOSAMAX) 70 MG tablet TAKE 1 TABLET BY MOUTH ONCE A WEEK WITH A FULL GLASS OF WATER ON AN EMPTY STOMACH 12 tablet 3  . amLODipine (NORVASC) 5 MG tablet TAKE 1 TABLET(5 MG) BY MOUTH DAILY 90 tablet 0  . hydrochlorothiazide (HYDRODIURIL) 25 MG tablet Take 1 tablet (25 mg total) by mouth daily. 90 tablet 3  . metoprolol succinate (TOPROL-XL) 25 MG 24 hr tablet TAKE 1 TABLET(25 MG) BY MOUTH DAILY 90 tablet 3  . alendronate (FOSAMAX) 70 MG tablet TAKE 1 TABLET BY MOUTH ONCE A WEEK WITH A FULL GLASS OF WATER ON AN EMPTY STOMACH 12 tablet 0   No facility-administered medications prior to visit.      Allergies  Allergen Reactions  . Codeine     Abdominal cramping    Review of Systems  Constitutional: Negative for fever and malaise/fatigue.  HENT: Negative for congestion.   Eyes: Negative for blurred vision.  Respiratory: Negative for cough and shortness of breath.   Cardiovascular: Negative for chest pain, palpitations and leg swelling.  Gastrointestinal: Negative for vomiting.  Musculoskeletal: Negative for back pain.  Skin: Negative for rash.  Neurological: Negative for loss of consciousness and headaches.       Objective:    Physical Exam  Constitutional: She is oriented to person, place, and time. She appears well-developed and well-nourished.  HENT:  Head: Normocephalic and atraumatic.  Eyes: Conjunctivae and EOM are normal.  Neck: Normal range of motion. Neck supple. No JVD present. Carotid bruit is not present. No thyromegaly present.  Cardiovascular: Normal rate, regular rhythm and normal heart sounds.  No murmur heard. Pulmonary/Chest: Effort normal. No respiratory distress. She has wheezes. She has no rales. She exhibits no tenderness.  Musculoskeletal: She exhibits no edema.  Neurological: She is alert and oriented to person, place, and time.  Psychiatric: She has a normal mood and affect.  Nursing note and vitals reviewed.   BP (!) 152/61   Pulse 62   Temp 97.9 F (36.6 C) (Oral)   Resp 16   Ht 5' (1.524 m)   Wt 153 lb 12.8 oz (69.8 kg)   SpO2 96%   BMI 30.04 kg/m  Wt Readings from Last 3 Encounters:  03/24/18 153 lb 12.8 oz (69.8 kg)  06/20/17 154 lb 3.2 oz (69.9 kg)  05/31/16 147 lb 3.2 oz (66.8 kg)   BP Readings from Last 3 Encounters:  03/24/18 (!) 152/61  06/20/17 126/76  05/31/16 (!) 150/84     Immunization History  Administered Date(s) Administered  . Pneumococcal Conjugate-13 06/30/2015  . Pneumococcal Polysaccharide-23 03/24/2012, 06/20/2017  . Tdap 10/10/2011  . Zoster 03/24/2012    Health Maintenance  Topic Date Due    . INFLUENZA VACCINE  05/08/2018  . MAMMOGRAM  06/26/2018  . COLONOSCOPY  10/09/2021  . TETANUS/TDAP  10/09/2021  . DEXA SCAN  Completed  . Hepatitis C Screening  Completed  . PNA vac Low Risk Adult  Completed    Lab Results  Component Value Date   WBC 5.8 06/20/2017   HGB 15.3 (H) 06/20/2017   HCT 45.5 06/20/2017   PLT 211.0 06/20/2017   GLUCOSE 92 06/20/2017   CHOL 188 06/20/2017   TRIG 111.0 06/20/2017   HDL 85.50 06/20/2017   LDLCALC 81 06/20/2017   ALT 17 06/20/2017   AST 22 06/20/2017   NA 134 (L) 06/20/2017   K 4.9 06/20/2017   CL 99 06/20/2017   CREATININE 0.68 06/20/2017   BUN 7 06/20/2017   CO2 31 06/20/2017   TSH 0.77 03/24/2012   INR 0.91 12/14/2011   MICROALBUR 2.4 (H) 06/30/2015    Lab Results  Component Value Date   TSH 0.77 03/24/2012   Lab Results  Component Value Date   WBC 5.8 06/20/2017   HGB 15.3 (H) 06/20/2017   HCT 45.5 06/20/2017   MCV 99.8 06/20/2017   PLT 211.0 06/20/2017   Lab Results  Component Value Date   NA 134 (L) 06/20/2017   K 4.9 06/20/2017   CO2 31 06/20/2017   GLUCOSE 92 06/20/2017   BUN 7 06/20/2017   CREATININE 0.68 06/20/2017   BILITOT 0.7 06/20/2017   ALKPHOS 51 06/20/2017   AST 22 06/20/2017   ALT 17 06/20/2017   PROT 7.0 06/20/2017   ALBUMIN 4.3 06/20/2017   CALCIUM 9.4 06/20/2017   ANIONGAP 8 08/09/2015   GFR 90.80 06/20/2017   Lab Results  Component Value Date   CHOL 188 06/20/2017   Lab Results  Component Value Date   HDL 85.50 06/20/2017   Lab Results  Component Value Date   LDLCALC 81 06/20/2017   Lab Results  Component Value Date   TRIG 111.0 06/20/2017   Lab Results  Component Value Date   CHOLHDL 2 06/20/2017   No results found for: HGBA1C       Assessment & Plan:   Problem List Items Addressed This Visit      Unprioritized   COPD (chronic obstructive pulmonary disease) ([place])    symbicort 2 puffs bid -- pt instructed on use And to brush tongue and rinse mouth with water        Relevant Medications   budesonide-formoterol (SYMBICORT) 80-4.5 MCG/ACT inhaler   Essential hypertension    Poorly controlled will alter medications, encouraged DASH diet, minimize caffeine and obtain adequate sleep. Report concerning symptoms and follow up as directed and as needed      Relevant Medications   hydrochlorothiazide (HYDRODIURIL) 25 MG tablet   amLODipine (NORVASC) 10 MG tablet   metoprolol succinate (TOPROL-XL) 25 MG 24 hr tablet   Other Relevant Orders   Comprehensive metabolic panel   Lipid panel   Lower extremity edema   Relevant Medications   hydrochlorothiazide (HYDRODIURIL) 25 MG tablet   Tobacco abuse    Pt does not want to quit Has ct scheduled        Other Visit Diagnoses    Hyperlipidemia LDL goal <100    -  Primary   Relevant Medications   hydrochlorothiazide (HYDRODIURIL) 25 MG tablet   amLODipine (NORVASC) 10 MG tablet   metoprolol succinate (TOPROL-XL) 25 MG 24 hr tablet   Other Relevant Orders   Comprehensive metabolic panel   Lipid panel   Osteoporosis, unspecified osteoporosis type, unspecified pathological fracture presence       Relevant Medications   alendronate (FOSAMAX) 70 MG tablet   COPD exacerbation ([facility])       Relevant Medications   budesonide-formoterol (  SYMBICORT) 80-4.5 MCG/ACT inhaler      I have discontinued Aayra A. Bayona's amLODipine. I am also having her start on budesonide-formoterol and amLODipine. Additionally, I am having her maintain her flintstones complete, alendronate, hydrochlorothiazide, and metoprolol succinate.  Meds ordered this encounter  Medications  . alendronate (FOSAMAX) 70 MG tablet    Sig: TAKE 1 TABLET BY MOUTH ONCE A WEEK WITH A FULL GLASS OF WATER ON AN EMPTY STOMACH    Dispense:  12 tablet    Refill:  3  . hydrochlorothiazide (HYDRODIURIL) 25 MG tablet    Sig: Take 1 tablet (25 mg total) by mouth daily.    Dispense:  90 tablet    Refill:  3  . budesonide-formoterol (SYMBICORT) 80-4.5  MCG/ACT inhaler    Sig: Inhale 2 puffs into the lungs 2 (two) times daily.    Dispense:  1 Inhaler    Refill:  3  . amLODipine (NORVASC) 10 MG tablet    Sig: Take 1 tablet (10 mg total) by mouth daily.    Dispense:  90 tablet    Refill:  3  . metoprolol succinate (TOPROL-XL) 25 MG 24 hr tablet    Sig: TAKE 1 TABLET(25 MG) BY MOUTH DAILY    Dispense:  90 tablet    Refill:  3    CMA served as scribe during this visit. History, Physical and Plan performed by medical provider. Documentation and orders reviewed and attested to.  Ann Held, DO

## 2018-03-24 NOTE — Assessment & Plan Note (Signed)
Poorly controlled will alter medications, encouraged DASH diet, minimize caffeine and obtain adequate sleep. Report concerning symptoms and follow up as directed and as needed 

## 2018-03-26 ENCOUNTER — Encounter: Payer: Self-pay | Admitting: *Deleted

## 2018-03-26 ENCOUNTER — Other Ambulatory Visit: Payer: Self-pay | Admitting: *Deleted

## 2018-03-26 DIAGNOSIS — E785 Hyperlipidemia, unspecified: Secondary | ICD-10-CM

## 2018-03-26 DIAGNOSIS — I10 Essential (primary) hypertension: Secondary | ICD-10-CM

## 2018-04-04 DIAGNOSIS — M810 Age-related osteoporosis without current pathological fracture: Secondary | ICD-10-CM | POA: Diagnosis not present

## 2018-04-04 LAB — HM DEXA SCAN

## 2018-04-09 ENCOUNTER — Telehealth: Payer: Self-pay | Admitting: *Deleted

## 2018-04-09 NOTE — Telephone Encounter (Signed)
Received Bone Density results from [place]; forwarded to provider/SLS 07/03

## 2018-04-15 ENCOUNTER — Telehealth: Payer: Self-pay | Admitting: *Deleted

## 2018-04-15 NOTE — Telephone Encounter (Signed)
Received Bone Density results from [place]; forwarded to provider/SLS 07/09

## 2018-04-17 ENCOUNTER — Telehealth: Payer: Self-pay | Admitting: *Deleted

## 2018-04-17 ENCOUNTER — Encounter: Payer: Self-pay | Admitting: *Deleted

## 2018-04-17 NOTE — Telephone Encounter (Signed)
Left message on machine to call back  

## 2018-04-17 NOTE — Telephone Encounter (Signed)
From Dr. Etter Sjogren  Bone density has slightly improved but still shows osteoporosis.  You may be a good canidate for Prolia.  Make sure you are taking calcium and vitamin D as well.

## 2018-04-23 NOTE — Telephone Encounter (Signed)
Patient notified.  She declined the prolia at this time and will continue calcium.

## 2018-06-13 ENCOUNTER — Other Ambulatory Visit: Payer: Self-pay | Admitting: Family Medicine

## 2018-06-13 DIAGNOSIS — M810 Age-related osteoporosis without current pathological fracture: Secondary | ICD-10-CM

## 2018-06-13 DIAGNOSIS — I10 Essential (primary) hypertension: Secondary | ICD-10-CM

## 2018-06-16 NOTE — Telephone Encounter (Signed)
Please call and ask pt if it was changed somewhere else

## 2018-06-16 NOTE — Telephone Encounter (Signed)
Rx request received via fax from [facility] for amlodopine 5mg , but med list reflects pt. Taking 10mg . Routed to Dr. Etter Sjogren to review.

## 2018-06-19 NOTE — Telephone Encounter (Signed)
Author phoned pt. to review medications. No answer; author left generic VM asking for call back [phone].

## 2018-07-04 DIAGNOSIS — Z1231 Encounter for screening mammogram for malignant neoplasm of breast: Secondary | ICD-10-CM | POA: Diagnosis not present

## 2018-07-04 LAB — HM MAMMOGRAPHY

## 2018-07-19 ENCOUNTER — Other Ambulatory Visit: Payer: Self-pay | Admitting: Family Medicine

## 2018-07-19 DIAGNOSIS — R600 Localized edema: Secondary | ICD-10-CM

## 2018-08-05 ENCOUNTER — Encounter: Payer: Self-pay | Admitting: Family Medicine

## 2018-08-22 ENCOUNTER — Ambulatory Visit (INDEPENDENT_AMBULATORY_CARE_PROVIDER_SITE_OTHER)
Admission: RE | Admit: 2018-08-22 | Discharge: 2018-08-22 | Disposition: A | Discharge status: Home / Self Care | Payer: Medicare Other | Source: Ambulatory Visit | Attending: Acute Care | Admitting: Acute Care

## 2018-08-22 DIAGNOSIS — Z122 Encounter for screening for malignant neoplasm of respiratory organs: Secondary | ICD-10-CM

## 2018-08-22 DIAGNOSIS — F17210 Nicotine dependence, cigarettes, uncomplicated: Secondary | ICD-10-CM

## 2018-09-09 ENCOUNTER — Other Ambulatory Visit: Payer: Self-pay | Admitting: Acute Care

## 2018-09-09 DIAGNOSIS — Z122 Encounter for screening for malignant neoplasm of respiratory organs: Secondary | ICD-10-CM

## 2018-09-09 DIAGNOSIS — F17210 Nicotine dependence, cigarettes, uncomplicated: Secondary | ICD-10-CM

## 2018-09-09 DIAGNOSIS — Z87891 Personal history of nicotine dependence: Secondary | ICD-10-CM

## 2018-09-19 ENCOUNTER — Other Ambulatory Visit: Payer: Self-pay | Admitting: Family Medicine

## 2018-09-19 DIAGNOSIS — M810 Age-related osteoporosis without current pathological fracture: Secondary | ICD-10-CM

## 2018-10-12 ENCOUNTER — Other Ambulatory Visit: Payer: Self-pay | Admitting: Family Medicine

## 2018-10-12 DIAGNOSIS — R600 Localized edema: Secondary | ICD-10-CM

## 2018-11-25 ENCOUNTER — Ambulatory Visit (INDEPENDENT_AMBULATORY_CARE_PROVIDER_SITE_OTHER)
Admission: RE | Admit: 2018-11-25 | Discharge: 2018-11-25 | Disposition: A | Discharge status: Home / Self Care | Payer: Medicare Other | Source: Ambulatory Visit | Attending: Acute Care | Admitting: Acute Care

## 2018-11-25 ENCOUNTER — Telehealth: Payer: Self-pay | Admitting: Acute Care

## 2018-11-25 DIAGNOSIS — Z122 Encounter for screening for malignant neoplasm of respiratory organs: Secondary | ICD-10-CM

## 2018-11-25 DIAGNOSIS — F17210 Nicotine dependence, cigarettes, uncomplicated: Secondary | ICD-10-CM

## 2018-11-25 DIAGNOSIS — Z87891 Personal history of nicotine dependence: Secondary | ICD-10-CM | POA: Diagnosis not present

## 2018-11-25 NOTE — Telephone Encounter (Signed)
Call report from  [place] at [facility]  IMPRESSION: 1. Lung-RADS 4B, suspicious. Continued progression of the irregular, multi lobular left lower lobe nodule now measuring up to 8.1 mm, increased from 2.9 mm on 08/02/2017 and considered highly suspicious for primary bronchogenic neoplasm. Additional imaging evaluation or consultation with Pulmonology or Thoracic Surgery recommended. 2.  Emphysema. (ICD10-J43.9) 3.  Aortic Atherosclerois (ICD10-170.0)  Eric Form NP please advise

## 2018-11-26 ENCOUNTER — Other Ambulatory Visit: Payer: Self-pay | Admitting: Acute Care

## 2018-11-26 DIAGNOSIS — R911 Solitary pulmonary nodule: Secondary | ICD-10-CM

## 2018-12-02 ENCOUNTER — Ambulatory Visit (INDEPENDENT_AMBULATORY_CARE_PROVIDER_SITE_OTHER): Payer: Medicare Other | Admitting: Emergency Medicine

## 2018-12-02 DIAGNOSIS — J449 Chronic obstructive pulmonary disease, unspecified: Secondary | ICD-10-CM | POA: Diagnosis not present

## 2018-12-02 DIAGNOSIS — R911 Solitary pulmonary nodule: Secondary | ICD-10-CM

## 2018-12-02 LAB — PULMONARY FUNCTION TEST
DL/VA % pred: 73 %
DL/VA: 3.15 ml/min/mmHg/L
DLCO unc % pred: 62 %
DLCO unc: 10.55 ml/min/mmHg
FEF 25-75 Post: 0.63 L/sec
FEF 25-75 Pre: 0.34 L/sec
FEF2575-%Change-Post: 82 %
FEF2575-%Pred-Post: 38 %
FEF2575-%Pred-Pre: 21 %
FEV1-%Change-Post: 25 %
FEV1-%Pred-Post: 47 %
FEV1-%Pred-Pre: 37 %
FEV1-Post: 0.88 L
FEV1-Pre: 0.7 L
FEV1FVC-%Change-Post: -2 %
FEV1FVC-%Pred-Pre: 71 %
FEV6-%Change-Post: 27 %
FEV6-%Pred-Post: 68 %
FEV6-%Pred-Pre: 53 %
FEV6-Post: 1.62 L
FEV6-Pre: 1.27 L
FEV6FVC-%Change-Post: -1 %
FEV6FVC-%Pred-Post: 102 %
FEV6FVC-%Pred-Pre: 103 %
FVC-%Change-Post: 29 %
FVC-%Pred-Post: 66 %
FVC-%Pred-Pre: 51 %
FVC-Post: 1.67 L
FVC-Pre: 1.29 L
Post FEV1/FVC ratio: 53 %
Post FEV6/FVC ratio: 97 %
Pre FEV1/FVC ratio: 54 %
Pre FEV6/FVC Ratio: 99 %
RV % pred: 169 %
RV: 3.4 L
TLC % pred: 105 %
TLC: 4.72 L

## 2018-12-02 NOTE — Progress Notes (Signed)
Full PFT performed today. °

## 2018-12-05 ENCOUNTER — Ambulatory Visit (HOSPITAL_COMMUNITY)
Admission: RE | Admit: 2018-12-05 | Discharge: 2018-12-05 | Disposition: A | Discharge status: Home / Self Care | Payer: Medicare Other | Source: Ambulatory Visit | Attending: Acute Care | Admitting: Acute Care

## 2018-12-05 DIAGNOSIS — R911 Solitary pulmonary nodule: Secondary | ICD-10-CM

## 2018-12-05 LAB — GLUCOSE, CAPILLARY: Glucose-Capillary: 86 mg/dL (ref 70–99)

## 2018-12-05 MED ORDER — FLUDEOXYGLUCOSE F - 18 (FDG) INJECTION
7.6200 | Freq: Once | INTRAVENOUS | Status: AC | PRN
  Administered 2018-12-05: 7.62 via INTRAVENOUS

## 2018-12-10 ENCOUNTER — Encounter: Payer: Self-pay | Admitting: Pulmonary Disease

## 2018-12-10 ENCOUNTER — Ambulatory Visit: Payer: Medicare Other | Admitting: Pulmonary Disease

## 2018-12-10 VITALS — BP 130/80 | HR 80 | Ht 61.0 in | Wt 155.6 lb

## 2018-12-10 DIAGNOSIS — R911 Solitary pulmonary nodule: Secondary | ICD-10-CM | POA: Insufficient documentation

## 2018-12-10 DIAGNOSIS — Z720 Tobacco use: Secondary | ICD-10-CM

## 2018-12-10 DIAGNOSIS — J449 Chronic obstructive pulmonary disease, unspecified: Secondary | ICD-10-CM

## 2018-12-10 DIAGNOSIS — R918 Other nonspecific abnormal finding of lung field: Secondary | ICD-10-CM | POA: Diagnosis not present

## 2018-12-10 HISTORY — DX: Solitary pulmonary nodule: R91.1

## 2018-12-10 MED ORDER — FLUTICASONE-UMECLIDIN-VILANT 100-62.5-25 MCG/INH IN AEPB: 1.0000 | INHALATION_SPRAY | Freq: Every day | RESPIRATORY_TRACT | 5 refills | Status: DC

## 2018-12-10 MED ORDER — FLUTICASONE-UMECLIDIN-VILANT 100-62.5-25 MCG/INH IN AEPB: 1.0000 | INHALATION_SPRAY | Freq: Every day | RESPIRATORY_TRACT | 0 refills | Status: DC

## 2018-12-10 NOTE — Patient Instructions (Addendum)
Thank you for visiting Dr. Valeta Harms at [facility]. Today we recommend the following: Orders Placed This Encounter  Procedures  . CT Chest Wo Contrast  . Ambulatory referral to Radiation Oncology   We will start you on trelegy inhaler.  I will discuss your case with one of our radiation oncologist and send a referral for you to meet them.   Return in about 3 months (around 03/12/2019).

## 2018-12-10 NOTE — Progress Notes (Signed)
Patient seen in the office today and instructed on use of Trelegy Ellipta inhaler.  Patient expressed understanding and demonstrated technique.

## 2018-12-10 NOTE — Progress Notes (Signed)
Synopsis: Referred in March 2020 for abnormal lung cancer screening CT, abnormal PET scan by Carollee Herter, Alferd Apa, *  Subjective:   PATIENT ID: Pam Cameron GENDER: female DOB: 1946/03/25, MRN: [id number]  Chief Complaint  Patient presents with  . Follow-up    PET scan results    This is a 73 year old female longtime smoker.  Currently smoking 1 pack/day.  She had recent pulmonary function tests which reveals severe COPD with an FEV1 postbronchodilator response of 0.88 L.  At baseline she has daily wheezing.  She is not on any inhalers.  She has never been on any medications to help support her breathing.  She has currently no plans to quit smoking.  She was enrolled in our lung cancer screening program.  There was an initial small left lower lobe nodule that was found in November 2019.  She had subsequent low-dose lung cancer screening follow-up which revealed enlargement of the left lower lobe nodule.  She ultimately had PET scan imaging completed that revealed low level PET uptake of 1.76 SUV in the small 8 mm lung nodule.  At this time she does have daily wheezing and cough but she is still continuing to smoke.  Otherwise her respiratory symptoms are at her baseline.  She denies fevers chills night sweats weight loss or nausea vomiting or diarrhea.    Past Medical History:  Diagnosis Date  . Anemia   . Blood transfusion   . Cataracts, bilateral   . CHF (congestive heart failure) ([place])   . COPD (chronic obstructive pulmonary disease) ([place])   . Depression   . Hypertension   . Nodule of lower lobe of left lung 12/10/2018  . Pneumonia      Family History  Problem Relation Age of Onset  . Ovarian cancer Mother   . Hypertension Mother   . Cancer Mother        uterine  . Prostate cancer Father   . Hypertension Father   . Cancer Father        lung  . Dementia Father   . Diabetes Sister      Past Surgical History:  Procedure Laterality Date  . APPENDECTOMY    .  ESOPHAGOGASTRODUODENOSCOPY ENDOSCOPY    . FIXATION KYPHOPLASTY LUMBAR SPINE  12/14/2011  . KYPHOPLASTY N/A 08/10/2015   Procedure: Thoracic four and thoracic five kyphoplasty;  Surgeon: Kevan Ny Ditty, MD;  Location: [facility];  Service: Neurosurgery;  Laterality: N/A;  T4 and T5 kyphoplasties  . LUNG SURGERY     fluid removed after pneumonia  . TONSILLECTOMY    . TUBAL LIGATION      Social History   Socioeconomic History  . Marital status: Single    Spouse name: Not on file  . Number of children: 0  . Years of education: Not on file  . Highest education level: Not on file  Occupational History  . Occupation: Scientist, clinical (histocompatibility and immunogenetics): [employer]  Social Needs  . Financial resource strain: Not on file  . Food insecurity:    Worry: Not on file    Inability: Not on file  . Transportation needs:    Medical: Not on file    Non-medical: Not on file  Tobacco Use  . Smoking status: Current Every Day Smoker    Packs/day: 1.50    Years: 50.00    Pack years: 75.00    Types: Cigarettes    Start date: 12/09/1968  . Smokeless tobacco: Never Used  .  Tobacco comment: pt does not want to quit  Substance and Sexual Activity  . Alcohol use: Yes    Alcohol/week: 14.0 standard drinks    Types: 14 Standard drinks or equivalent per week  . Drug use: No  . Sexual activity: Yes    Partners: Male  Lifestyle  . Physical activity:    Days per week: Not on file    Minutes per session: Not on file  . Stress: Not on file  Relationships  . Social connections:    Talks on phone: Not on file    Gets together: Not on file    Attends religious service: Not on file    Active member of club or organization: Not on file    Attends meetings of clubs or organizations: Not on file    Relationship status: Not on file  . Intimate partner violence:    Fear of current or ex partner: Not on file    Emotionally abused: Not on file    Physically abused: Not on file    Forced sexual activity: Not on  file  Other Topics Concern  . Not on file  Social History Narrative   Exercise--no     Allergies  Allergen Reactions  . Codeine     Abdominal cramping     Outpatient Medications Prior to Visit  Medication Sig Dispense Refill  . alendronate (FOSAMAX) 70 MG tablet TAKE 1 TABLET BY MOUTH ONCE A WEEK WITH A FULL GLASS OF WATER ON AN EMPTY STOMACH 12 tablet 3  . amLODipine (NORVASC) 5 MG tablet TAKE 1 TABLET(5 MG) BY MOUTH DAILY 90 tablet 1  . flintstones complete (FLINTSTONES) 60 MG chewable tablet Chew 1 tablet by mouth daily.      . hydrochlorothiazide (HYDRODIURIL) 25 MG tablet TAKE 1 TABLET(25 MG) BY MOUTH DAILY 90 tablet 0  . metoprolol succinate (TOPROL-XL) 25 MG 24 hr tablet TAKE 1 TABLET(25 MG) BY MOUTH DAILY 90 tablet 0  . budesonide-formoterol (SYMBICORT) 80-4.5 MCG/ACT inhaler Inhale 2 puffs into the lungs 2 (two) times daily. (Patient not taking: Reported on 12/10/2018) 1 Inhaler 3   No facility-administered medications prior to visit.     Review of Systems  Constitutional: Negative for chills, fever, malaise/fatigue and weight loss.  HENT: Negative for hearing loss, sore throat and tinnitus.   Eyes: Negative for blurred vision and double vision.  Respiratory: Positive for cough and wheezing. Negative for hemoptysis, sputum production, shortness of breath and stridor.   Cardiovascular: Negative for chest pain, palpitations, orthopnea, leg swelling and PND.  Gastrointestinal: Negative for abdominal pain, constipation, diarrhea, heartburn, nausea and vomiting.  Genitourinary: Negative for dysuria, hematuria and urgency.  Musculoskeletal: Negative for joint pain and myalgias.  Skin: Negative for itching and rash.  Neurological: Negative for dizziness, tingling, weakness and headaches.  Endo/Heme/Allergies: Negative for environmental allergies. Does not bruise/bleed easily.  Psychiatric/Behavioral: Negative for depression. The patient is not nervous/anxious and does not have  insomnia.   All other systems reviewed and are negative.    Objective:  Physical Exam Vitals signs reviewed.  Constitutional:      General: She is not in acute distress.    Appearance: She is well-developed.  HENT:     Head: Normocephalic and atraumatic.     Mouth/Throat:     Pharynx: No oropharyngeal exudate.  Eyes:     Conjunctiva/sclera: Conjunctivae normal.     Pupils: Pupils are equal, round, and reactive to light.  Neck:     Vascular: No  JVD.     Trachea: No tracheal deviation.     Comments: Loss of supraclavicular fat Cardiovascular:     Rate and Rhythm: Normal rate and regular rhythm.     Heart sounds: S1 normal and S2 normal.     Comments: Distant heart tones Pulmonary:     Effort: No tachypnea or accessory muscle usage.     Breath sounds: No stridor. Decreased breath sounds (throughout all lung fields) and wheezing present. No rhonchi or rales.  Abdominal:     General: Bowel sounds are normal. There is no distension.     Palpations: Abdomen is soft.     Tenderness: There is no abdominal tenderness.  Musculoskeletal:        General: No deformity.     Right lower leg: Edema present.     Left lower leg: Edema present.  Skin:    General: Skin is warm and dry.     Capillary Refill: Capillary refill takes less than 2 seconds.     Findings: No rash.  Neurological:     Mental Status: She is alert and oriented to person, place, and time.  Psychiatric:        Behavior: Behavior normal.      Vitals:   12/10/18 1613  BP: 130/80  Pulse: 80  SpO2: 97%  Weight: 155 lb 9.6 oz (70.6 kg)  Height: 5\' 1"  (1.549 m)   97% on RA BMI Readings from Last 3 Encounters:  12/10/18 29.40 kg/m  03/24/18 30.04 kg/m  06/20/17 30.12 kg/m   Wt Readings from Last 3 Encounters:  12/10/18 155 lb 9.6 oz (70.6 kg)  03/24/18 153 lb 12.8 oz (69.8 kg)  06/20/17 154 lb 3.2 oz (69.9 kg)     CBC    Component Value Date/Time   WBC 5.8 06/20/2017 1030   RBC 4.56 06/20/2017 1030    HGB 15.3 (H) 06/20/2017 1030   HCT 45.5 06/20/2017 1030   PLT 211.0 06/20/2017 1030   MCV 99.8 06/20/2017 1030   MCH 32.9 08/09/2015 0933   MCHC 33.6 06/20/2017 1030   RDW 13.4 06/20/2017 1030   LYMPHSABS 1.3 06/20/2017 1030   MONOABS 0.5 06/20/2017 1030   EOSABS 0.1 06/20/2017 1030   BASOSABS 0.1 06/20/2017 1030    Chest Imaging: 12/05/2018 nuclear medicine pet imaging: 8 mm left lower lobe lung nodule with SUV of 1.76.  Suspicious for small neoplasm.  Does have PET avid uptake and the lesion is less than 1 cm in size.  No additional foci of increased PET activity.  Pulmonary Functions Testing Results: PFT Results Latest Ref Rng & Units 12/02/2018  FVC-Pre L 1.29  FVC-Predicted Pre % 51  FVC-Post L 1.67  FVC-Predicted Post % 66  Pre FEV1/FVC % % 54  Post FEV1/FCV % % 53  FEV1-Pre L 0.70  FEV1-Predicted Pre % 37  FEV1-Post L 0.88  DLCO UNC% % 62  DLCO COR %Predicted % 73  TLC L 4.72  TLC % Predicted % 105  RV % Predicted % 169     FeNO: None  Pathology: None  Echocardiogram: None  Heart Catheterization: None    Assessment & Plan:   Stage 3 severe COPD by GOLD classification (HCC)  Nodule of lower lobe of left lung - Plan: CT Chest Wo Contrast, Ambulatory referral to Radiation Oncology  Abnormal findings on diagnostic imaging of lung - Plan: CT Chest Wo Contrast, Ambulatory referral to Radiation Oncology  Tobacco abuse  Discussion:  This is a 73 year old  current smoker with severe COPD, left lower lobe 8 mm lung nodule, with low level PET uptake.  The nodule has nearly doubled in size since her previous CT imaging in November of this past year.  With the severity of her lung disease and her age and the fact that she is still smoking I am not convinced she would be a good candidate for surgical resection.  Therefore I have ordered a 76-month CT follow-up imaging but I would like to discuss her case with our radiation oncologist.  The location of the lesion is  within the superior segment of the left lower lobe and it is rather small in size.  I do think it would be very difficult to obtain tissue diagnosis via electromagnetic navigational bronchoscopy.  If it was necessary could consider percutaneous IR approach however it is a's very small lesion.  Due to the fact that she is likely a nonsurgical candidate and this is a potential malignancy would like for radiation oncology to evaluate for potential treatment.   I have placed a referral to radiation oncology.  I will send them a message to discuss.  We will start her on Trelegy.  For her severe COPD.  Patient was also counseled on smoking cessation.  Greater than 50% of this patient's 60-minute office visit was spent face-to-face discussing above recommendations treatment plan.    Current Outpatient Medications:  .  alendronate (FOSAMAX) 70 MG tablet, TAKE 1 TABLET BY MOUTH ONCE A WEEK WITH A FULL GLASS OF WATER ON AN EMPTY STOMACH, Disp: 12 tablet, Rfl: 3 .  amLODipine (NORVASC) 5 MG tablet, TAKE 1 TABLET(5 MG) BY MOUTH DAILY, Disp: 90 tablet, Rfl: 1 .  flintstones complete (FLINTSTONES) 60 MG chewable tablet, Chew 1 tablet by mouth daily.  , Disp: , Rfl:  .  hydrochlorothiazide (HYDRODIURIL) 25 MG tablet, TAKE 1 TABLET(25 MG) BY MOUTH DAILY, Disp: 90 tablet, Rfl: 0 .  metoprolol succinate (TOPROL-XL) 25 MG 24 hr tablet, TAKE 1 TABLET(25 MG) BY MOUTH DAILY, Disp: 90 tablet, Rfl: 0 .  budesonide-formoterol (SYMBICORT) 80-4.5 MCG/ACT inhaler, Inhale 2 puffs into the lungs 2 (two) times daily. (Patient not taking: Reported on 12/10/2018), Disp: 1 Inhaler, Rfl: 3 .  Fluticasone-Umeclidin-Vilant (TRELEGY ELLIPTA) 100-62.5-25 MCG/INH AEPB, Inhale 1 puff into the lungs daily., Disp: 1 each, Rfl: 0 .  Fluticasone-Umeclidin-Vilant (TRELEGY ELLIPTA) 100-62.5-25 MCG/INH AEPB, Inhale 1 puff into the lungs daily., Disp: 1 each, Rfl: [place], DO [facility] 12/10/2018 5:07 PM

## 2018-12-12 NOTE — Progress Notes (Signed)
Thoracic Location of Tumor / Histology:  left lower lobe nodule  Patient presented with symptoms of: She was enrolled in our lung cancer screening program.  There was an initial small left lower lobe nodule that was found in November 2019.  She had subsequent low-dose lung cancer screening follow-up which revealed enlargement of the left lower lobe nodule.  She ultimately had PET scan imaging completed that revealed low level PET uptake of 1.76 SUV in the small 8 mm lung nodule.   Biopsies revealed:  I do think it would be very difficult to obtain tissue diagnosis via electromagnetic navigational bronchoscopy.  If it was necessary could consider percutaneous IR approach however it is a's very small lesion.  Due to the fact that she is likely a nonsurgical candidate and this is a potential malignancy would like for radiation oncology to evaluate for potential treatment.  Tobacco/Marijuana/Snuff/ETOH use:  Tobacco Use  . Smoking status: Current Every Day Smoker    Packs/day: 1.50    Years: 50.00    Pack years: 75.00    Types: Cigarettes    Start date: 12/09/1968  . Smokeless tobacco: Never Used  . Tobacco comment: pt does not want to quit  Substance and Sexual Activity  . Alcohol use: Yes    Alcohol/week: 14.0 standard drinks    Types: 14 Standard drinks or equivalent per week  . Drug use: No     Past/Anticipated interventions by cardiothoracic surgery, if any: Not a surgical candidate  Past/Anticipated interventions by medical oncology, if any: None at this time  Signs/Symptoms  Weight changes, if any: She denies fevers chills night sweats weight loss or nausea vomiting or diarrhea.  Respiratory complaints, if any: At this time she does have daily wheezing and cough but she is still continuing to smoke.  Otherwise her respiratory symptoms are at her baseline.  Hemoptysis, if any: pt with cough, clear - yellow sputum, denies hemoptysis.   Pain issues, if any:  Pt denies  c/o pain.  SAFETY ISSUES:  Prior radiation? No  Pacemaker/ICD? No  Possible current pregnancy? No  Is the patient on methotrexate? No  Current Complaints / other details:  Pt presents today for initial consult with Dr. Sondra Come for Radiation Oncology. Pt is unaccompanied.   BP 135/66 (BP Location: Right Arm, Patient Position: Sitting)   Pulse 73   Temp 98.1 F (36.7 C) (Oral)   Resp 20   Ht 5\' 1"  (1.549 m)   Wt 158 lb 3.2 oz (71.8 kg)   SpO2 97%   BMI 29.89 kg/m   Wt Readings from Last 3 Encounters:  12/17/18 158 lb 3.2 oz (71.8 kg)  12/10/18 155 lb 9.6 oz (70.6 kg)  03/24/18 153 lb 12.8 oz (69.8 kg)   Loma Sousa, RN BSN

## 2018-12-17 ENCOUNTER — Encounter: Payer: Self-pay | Admitting: Radiation Oncology

## 2018-12-17 ENCOUNTER — Other Ambulatory Visit: Payer: Self-pay

## 2018-12-17 ENCOUNTER — Ambulatory Visit
Admission: RE | Admit: 2018-12-17 | Discharge: 2018-12-17 | Disposition: A | Discharge status: Home / Self Care | Payer: Medicare Other | Source: Ambulatory Visit | Attending: Radiation Oncology | Admitting: Radiation Oncology

## 2018-12-17 VITALS — BP 135/66 | HR 73 | Temp 98.1°F | Resp 20 | Ht 61.0 in | Wt 158.2 lb

## 2018-12-17 DIAGNOSIS — Z8249 Family history of ischemic heart disease and other diseases of the circulatory system: Secondary | ICD-10-CM | POA: Diagnosis not present

## 2018-12-17 DIAGNOSIS — I110 Hypertensive heart disease with heart failure: Secondary | ICD-10-CM | POA: Diagnosis not present

## 2018-12-17 DIAGNOSIS — F17210 Nicotine dependence, cigarettes, uncomplicated: Secondary | ICD-10-CM | POA: Diagnosis not present

## 2018-12-17 DIAGNOSIS — J449 Chronic obstructive pulmonary disease, unspecified: Secondary | ICD-10-CM | POA: Diagnosis not present

## 2018-12-17 DIAGNOSIS — I509 Heart failure, unspecified: Secondary | ICD-10-CM | POA: Diagnosis not present

## 2018-12-17 DIAGNOSIS — Z8041 Family history of malignant neoplasm of ovary: Secondary | ICD-10-CM | POA: Diagnosis not present

## 2018-12-17 DIAGNOSIS — R911 Solitary pulmonary nodule: Secondary | ICD-10-CM | POA: Diagnosis not present

## 2018-12-17 DIAGNOSIS — Z8709 Personal history of other diseases of the respiratory system: Secondary | ICD-10-CM | POA: Diagnosis not present

## 2018-12-17 DIAGNOSIS — Z885 Allergy status to narcotic agent status: Secondary | ICD-10-CM | POA: Insufficient documentation

## 2018-12-17 DIAGNOSIS — Z833 Family history of diabetes mellitus: Secondary | ICD-10-CM | POA: Diagnosis not present

## 2018-12-17 DIAGNOSIS — Z79899 Other long term (current) drug therapy: Secondary | ICD-10-CM | POA: Insufficient documentation

## 2018-12-17 DIAGNOSIS — C3432 Malignant neoplasm of lower lobe, left bronchus or lung: Secondary | ICD-10-CM

## 2018-12-17 DIAGNOSIS — Z8042 Family history of malignant neoplasm of prostate: Secondary | ICD-10-CM | POA: Diagnosis not present

## 2018-12-17 NOTE — Progress Notes (Signed)
Radiation Oncology         [phone] ________________________________  Initial Outpatient Consultation  Name: Pam Cameron MRN: [id number]  Date: 12/17/2018  DOB: 1946-10-03  WU:[id number] Chase, Alferd Apa, DO  Icard, Octavio Graves, DO   REFERRING PHYSICIAN: Garner Nash, DO  DIAGNOSIS: The encounter diagnosis was Nodule of lower lobe of left lung.   PET Positive Left lower pulmonary nodule,  clinical stage IA1 (T1a, N0, M0)  HISTORY OF PRESENT ILLNESS::Pam Cameron is a 73 y.o. female who is presenting to the office today for evaluation of her lung nodule. Patient was enrolled in our lung cancer screening program per Dr. Valeta Harms and thus received annual CT scans. An initial small left lower lobe nodule was discovered by CT in November 2019, enlarged on subsequent scan in February. She is a current every day smoker and has a 75 pack year hx. Dr. Valeta Harms referred her to the clinic today for consultation. She is not currently on supplemental oxygen. She reports pneumonia 20-25 years ago which she had surgery to treat. She keeps active doing Tai Chi Mondays and Thursdays.   She underwent PET scan on 12/05/18 which showed the 8 mm left lower lobe lung nodule in question had an SUV max of 1.76. Given the small size of this nodule (less than 1 cm) this degree of FDG uptake was deemed suspicious. A small pulmonary neoplasm could not be excluded. No additional foci of increased uptake to suggest metastatic disease was noted.   she denies hemoptysis, chest pain, headaches, back pain and any other symptoms.    PREVIOUS RADIATION THERAPY: No  PAST MEDICAL HISTORY:  has a past medical history of Anemia, Blood transfusion, Cataracts, bilateral, CHF (congestive heart failure) ([place]), COPD (chronic obstructive pulmonary disease) ([place]), Depression, Hypertension, Nodule of lower lobe of left lung (12/10/2018), and Pneumonia.    PAST SURGICAL HISTORY: Past Surgical History:  Procedure Laterality Date    APPENDECTOMY     ESOPHAGOGASTRODUODENOSCOPY ENDOSCOPY     FIXATION KYPHOPLASTY LUMBAR SPINE  12/14/2011   KYPHOPLASTY N/A 08/10/2015   Procedure: Thoracic four and thoracic five kyphoplasty;  Surgeon: Kevan Ny Ditty, MD;  Location: [facility];  Service: Neurosurgery;  Laterality: N/A;  T4 and T5 kyphoplasties   LUNG SURGERY     fluid removed after pneumonia   TONSILLECTOMY     TUBAL LIGATION      FAMILY HISTORY: family history includes Cancer in her father and mother; Dementia in her father; Diabetes in her sister; Hypertension in her father and mother; Ovarian cancer in her mother; Prostate cancer in her father.  SOCIAL HISTORY:  reports that she has been smoking cigarettes. She started smoking about 50 years ago. She has a 75.00 pack-year smoking history. She has never used smokeless tobacco. She reports current alcohol use of about 14.0 standard drinks of alcohol per week. She reports that she does not use drugs.  ALLERGIES: Codeine  MEDICATIONS:  Current Outpatient Medications  Medication Sig Dispense Refill   alendronate (FOSAMAX) 70 MG tablet TAKE 1 TABLET BY MOUTH ONCE A WEEK WITH A FULL GLASS OF WATER ON AN EMPTY STOMACH 12 tablet 3   amLODipine (NORVASC) 5 MG tablet TAKE 1 TABLET(5 MG) BY MOUTH DAILY 90 tablet 1   flintstones complete (FLINTSTONES) 60 MG chewable tablet Chew 1 tablet by mouth daily.       Fluticasone-Umeclidin-Vilant (TRELEGY ELLIPTA) 100-62.5-25 MCG/INH AEPB Inhale 1 puff into the lungs daily. 1 each 0   hydrochlorothiazide (HYDRODIURIL)  25 MG tablet TAKE 1 TABLET(25 MG) BY MOUTH DAILY 90 tablet 0   metoprolol succinate (TOPROL-XL) 25 MG 24 hr tablet TAKE 1 TABLET(25 MG) BY MOUTH DAILY 90 tablet 0   budesonide-formoterol (SYMBICORT) 80-4.5 MCG/ACT inhaler Inhale 2 puffs into the lungs 2 (two) times daily. (Patient not taking: Reported on 12/10/2018) 1 Inhaler 3   Fluticasone-Umeclidin-Vilant (TRELEGY ELLIPTA) 100-62.5-25 MCG/INH AEPB Inhale 1  puff into the lungs daily. (Patient not taking: Reported on 12/17/2018) 1 each 5   No current facility-administered medications for this encounter.     REVIEW OF SYSTEMS:  A 10+ POINT REVIEW OF SYSTEMS WAS OBTAINED including neurology, dermatology, psychiatry, cardiac, respiratory, lymph, extremities, GI, GU, musculoskeletal, constitutional, reproductive, HEENT. All pertinent positives are noted in the HPI. All others are negative.    PHYSICAL EXAM:  height is '5\' 1"'  (1.549 m) and weight is 158 lb 3.2 oz (71.8 kg). Her oral temperature is 98.1 F (36.7 C). Her blood pressure is 135/66 and her pulse is 73. Her respiration is 20 and oxygen saturation is 97%.   General: Alert and oriented, in no acute distress HEENT: Head is normocephalic. Extraocular movements are intact. Oropharynx is clear. Neck: Neck is supple, no palpable cervical or supraclavicular lymphadenopathy. Heart: Regular in rate and rhythm with no murmurs, rubs, or gallops. Chest: Clear to auscultation bilaterally, with no rhonchi, wheezes, or rales. Abdomen: Soft, nontender, nondistended, with no rigidity or guarding. Extremities: No cyanosis or edema. Lymphatics: see Neck Exam Skin: No concerning lesions. Musculoskeletal: symmetric strength and muscle tone throughout. Neurologic: Cranial nerves II through XII are grossly intact. No obvious focalities. Speech is fluent. Coordination is intact. Psychiatric: Judgment and insight are intact. Affect is appropriate.   ECOG = 1  0 - Asymptomatic (Fully active, able to carry on all predisease activities without restriction)  1 - Symptomatic but completely ambulatory (Restricted in physically strenuous activity but ambulatory and able to carry out work of a light or sedentary nature. For example, light housework, office work)  2 - Symptomatic, <50% in bed during the day (Ambulatory and capable of all self care but unable to carry out any work activities. Up and about more than 50% of  waking hours)  3 - Symptomatic, >50% in bed, but not bedbound (Capable of only limited self-care, confined to bed or chair 50% or more of waking hours)  4 - Bedbound (Completely disabled. Cannot carry on any self-care. Totally confined to bed or chair)  5 - Death   Eustace Pen MM, Creech RH, Tormey DC, et al. [phone]). "Toxicity and response criteria of the [facility]". [place] Oncol. 5 (6): 649-55  LABORATORY DATA:  Lab Results  Component Value Date   WBC 5.8 06/20/2017   HGB 15.3 (H) 06/20/2017   HCT 45.5 06/20/2017   MCV 99.8 06/20/2017   PLT 211.0 06/20/2017   NEUTROABS 3.8 06/20/2017   Lab Results  Component Value Date   NA 137 03/24/2018   K 4.8 03/24/2018   CL 100 03/24/2018   CO2 31 03/24/2018   GLUCOSE 93 03/24/2018   CREATININE 0.67 03/24/2018   CALCIUM 9.6 03/24/2018    Pulmonary Functions Testing Results: PFT Results Latest Ref Rng & Units 12/02/2018  FVC-Pre L 1.29  FVC-Predicted Pre % 51  FVC-Post L 1.67  FVC-Predicted Post % 66  Pre FEV1/FVC % % 54  Post FEV1/FCV % % 53  FEV1-Pre L 0.70  FEV1-Predicted Pre % 37  FEV1-Post L 0.88  DLCO UNC% %  62  DLCO COR %Predicted % 73  TLC L 4.72  TLC % Predicted % 105  RV % Predicted % 169        RADIOGRAPHY: Nm Pet Image Initial (pi) Skull Base To Thigh  Result Date: 12/06/2018 CLINICAL DATA:  Initial treatment strategy for pulmonary nodule. EXAM: NUCLEAR MEDICINE PET SKULL BASE TO THIGH TECHNIQUE: 7.6 mCi F-18 FDG was injected intravenously. Full-ring PET imaging was performed from the skull base to thigh after the radiotracer. CT data was obtained and used for attenuation correction and anatomic localization. Fasting blood glucose: 86 mg/dl COMPARISON:  CT chest, Lung cancer screening 11/25/2018. FINDINGS: Mediastinal blood pool activity: SUV max 2.54 NECK: No hypermetabolic lymph nodes in the neck. Incidental CT findings: none CHEST: No hypermetabolic axillary or supraclavicular lymph  nodes. No hypermetabolic mediastinal or hilar lymph nodes. Advanced changes of emphysema with diffuse bronchial wall thickening. The progressive solid nodule in the left lower lobe is again noted. On today's study this measures 8 mm and has an SUV max of 1.76. No additional foci of increased uptake within the chest. Incidental CT findings: Aortic atherosclerosis. ABDOMEN/PELVIS: No abnormal uptake identified within the liver, pancreas, spleen, adrenal glands. There are no hypermetabolic lymph nodes within the abdomen the or pelvis. Incidental CT findings: Multiple gallstones are identified within the gallbladder. Increased caliber of the common bile duct measures up to 1.5 cm. No choledocholithiasis identified. Aortic atherosclerosis identified. Large right inguinal hernia contains a nonobstructed loop of small bowel. SKELETON: No focal hypermetabolic activity to suggest skeletal metastasis. Incidental CT findings: There are multiple compression fractures identified within the thoracic and lumbar spine which have been treated with bone cement. IMPRESSION: 1. 8 mm left lower lobe lung nodule has an SUV max of 1.76. Given the small size of this nodule (less than 1 cm) this degree of FDG uptake is suspicious. A small pulmonary neoplasm can not be excluded. Consider further investigation with tissue sampling. 2. No additional foci of increased uptake to suggest metastatic disease. 3. Gallstones 4. Aortic atherosclerosis 5. Emphysema 6. Multi level compression deformities within the thoracolumbar spine status post multilevel vertebral augmentation. Aortic Atherosclerosis (ICD10-I70.0) and Emphysema (ICD10-J43.9). Electronically Signed   By: Kerby Moors M.D.   On: 12/06/2018 14:54   Ct Chest Lcs Nodule F/u W/o Contrast  Result Date: 11/25/2018 CLINICAL DATA:  73 year old female with 42 pack-year history of smoking. Lung nodule on prior screening study. EXAM: CT CHEST WITHOUT CONTRAST FOR LUNG CANCER SCREENING  NODULE FOLLOW-UP TECHNIQUE: Multidetector CT imaging of the chest was performed following the standard protocol without IV contrast. COMPARISON:  08/22/2018. FINDINGS: Cardiovascular: The heart size is normal. No substantial pericardial effusion. Coronary artery calcification is evident. Atherosclerotic calcification is noted in the wall of the thoracic aorta. Mediastinum/Nodes: No mediastinal lymphadenopathy. No evidence for gross hilar lymphadenopathy although assessment is limited by the lack of intravenous contrast on today's study. The esophagus has normal imaging features. There is no axillary lymphadenopathy. Lungs/Pleura: The central tracheobronchial airways are patent. Centrilobular and paraseptal emphysema noted. The lobular irregular left lower lobe pulmonary nodule has continued to progress, measuring volume derived equivalent diameter of 8.1 mm today compared to 5.8 mm previously. This nodule was 2.9 mm on the study from 08/02/2017. Upper Abdomen: Unremarkable. Musculoskeletal: No worrisome lytic or sclerotic osseous abnormality. Status post multilevel thoracic vertebral augmentation. IMPRESSION: 1. Lung-RADS 4B, suspicious. Continued progression of the irregular, multi lobular left lower lobe nodule now measuring up to 8.1 mm, increased from 2.9 mm on  08/02/2017 and considered highly suspicious for primary bronchogenic neoplasm. Additional imaging evaluation or consultation with Pulmonology or Thoracic Surgery recommended. 2.  Emphysema. (ICD10-J43.9) 3.  Aortic Atherosclerois (ICD10-170.0) These results will be called to the ordering clinician or representative by the Radiologist Assistant, and communication documented in the PACS or zVision Dashboard. Electronically Signed   By: Misty Stanley M.D.   On: 11/25/2018 14:02      IMPRESSION: PET Positive Left lower pulmonary nodule,  clinical stage IA1 (T1a, N0, M0)   Patient would be a good candidate for SBRT. We discussed three general options; one  - consider CT bx of the lesion or bronchoscopic approach, this would be with significant risk given the patient's pulmonary status and she does not wish to proceed with lung biopsy. We also discussed just repeating CT scan in 3 months to see if the lesion increased further in size. The patient would prefer to go ahead and proceed with SBRT without tissue diagnosis.   Today, I talked to the patient about the findings and work-up thus far.  We discussed the natural history of her lesion and general treatment, highlighting the role of radiotherapy (SBRT) in the management.  We discussed the available radiation techniques, and focused on the details of logistics and delivery.  We reviewed the anticipated acute and late sequelae associated with radiation in this setting.  The patient was encouraged to ask questions that I answered to the best of my ability.  A patient consent form was discussed and signed.  We retained a copy for our records.  The patient would like to proceed with radiation and will be scheduled for CT simulation.   PLAN: She is scheduled for SBRT simulation in approximately 2 weeks. Anticipate 3 treatments.     ------------------------------------------------  Blair Promise, PhD, MD      This document serves as a record of services personally performed by Gery Pray, MD. It was created on his behalf by Mary-Margaret Loma Messing, a trained medical scribe. The creation of this record is based on the scribe's personal observations and the provider's statements to them. This document has been checked and approved by the attending provider.

## 2018-12-18 NOTE — Telephone Encounter (Signed)
Pt was notified by SG 11/26/2018 in regards to the results of the CT and then pt also had a PET performed 12/05/2018.  Pt was made aware the results of the PET. Nothing further needed.

## 2018-12-31 ENCOUNTER — Other Ambulatory Visit: Payer: Self-pay

## 2018-12-31 ENCOUNTER — Ambulatory Visit
Admission: RE | Admit: 2018-12-31 | Discharge: 2018-12-31 | Disposition: A | Discharge status: Home / Self Care | Payer: Medicare Other | Source: Ambulatory Visit | Attending: Radiation Oncology | Admitting: Radiation Oncology

## 2018-12-31 DIAGNOSIS — I110 Hypertensive heart disease with heart failure: Secondary | ICD-10-CM | POA: Diagnosis not present

## 2018-12-31 DIAGNOSIS — J449 Chronic obstructive pulmonary disease, unspecified: Secondary | ICD-10-CM | POA: Diagnosis not present

## 2018-12-31 DIAGNOSIS — F17210 Nicotine dependence, cigarettes, uncomplicated: Secondary | ICD-10-CM | POA: Diagnosis not present

## 2018-12-31 DIAGNOSIS — Z79899 Other long term (current) drug therapy: Secondary | ICD-10-CM | POA: Diagnosis not present

## 2018-12-31 DIAGNOSIS — R911 Solitary pulmonary nodule: Secondary | ICD-10-CM | POA: Diagnosis not present

## 2018-12-31 DIAGNOSIS — Z8041 Family history of malignant neoplasm of ovary: Secondary | ICD-10-CM | POA: Diagnosis not present

## 2018-12-31 DIAGNOSIS — I509 Heart failure, unspecified: Secondary | ICD-10-CM | POA: Diagnosis not present

## 2018-12-31 DIAGNOSIS — Z8042 Family history of malignant neoplasm of prostate: Secondary | ICD-10-CM | POA: Diagnosis not present

## 2018-12-31 DIAGNOSIS — C3432 Malignant neoplasm of lower lobe, left bronchus or lung: Secondary | ICD-10-CM | POA: Diagnosis not present

## 2018-12-31 DIAGNOSIS — Z833 Family history of diabetes mellitus: Secondary | ICD-10-CM | POA: Diagnosis not present

## 2018-12-31 DIAGNOSIS — Z8249 Family history of ischemic heart disease and other diseases of the circulatory system: Secondary | ICD-10-CM | POA: Diagnosis not present

## 2018-12-31 DIAGNOSIS — Z885 Allergy status to narcotic agent status: Secondary | ICD-10-CM | POA: Diagnosis not present

## 2018-12-31 NOTE — Progress Notes (Signed)
  Radiation Oncology         [phone]) [phone] ________________________________  Name: Pam Cameron MRN: [id number]  Date: 12/31/2018  DOB: [birth year]   STEREOTACTIC BODY RADIOTHERAPY SIMULATION AND TREATMENT PLANNING NOTE    DIAGNOSIS:  Nodule of lower lobe of left lung.   PET Positive Left lower pulmonary nodule,  clinical stage IA1 (T1a, N0, M0)  NARRATIVE:  The patient was brought to the [facility].  Identity was confirmed.  All relevant records and images related to the planned course of therapy were reviewed.  The patient freely provided informed written consent to proceed with treatment after reviewing the details related to the planned course of therapy. The consent form was witnessed and verified by the simulation staff.  Then, the patient was set-up in a stable reproducible  supine position for radiation therapy.  A BodyFix immobilization pillow was fabricated for reproducible positioning.  Then I personally applied the abdominal compression paddle to limit respiratory excursion.  4D respiratoy motion management CT images were obtained.  Surface markings were placed.  The CT images were loaded into the planning software.  Then, using Cine, MIP, and standard views, the internal target volume (ITV) and planning target volumes (PTV) were delinieated, and avoidance structures were contoured.  Treatment planning then occurred.  The radiation prescription was entered and confirmed.  A total of two complex treatment devices were fabricated in the form of the BodyFix immobilization pillow and a neck accuform cushion.  I have requested : 3D Simulation  I have requested a DVH of the following structures: Heart, Lungs, Esophagus, Chest Wall, Brachial Plexus, Major Blood Vessels, and targets.  PLAN:  The patient will receive 54 Gy in 3 fractions.  -----------------------------------  Blair Promise, PhD, MD This document serves as a record of services personally performed by  Gery Pray, MD. It was created on his behalf by Mary-Margaret Loma Messing, a trained medical scribe. The creation of this record is based on the scribe's personal observations and the provider's statements to them. This document has been checked and approved by the attending provider.

## 2019-01-06 DIAGNOSIS — J449 Chronic obstructive pulmonary disease, unspecified: Secondary | ICD-10-CM | POA: Diagnosis not present

## 2019-01-06 DIAGNOSIS — R911 Solitary pulmonary nodule: Secondary | ICD-10-CM | POA: Diagnosis not present

## 2019-01-06 DIAGNOSIS — Z8041 Family history of malignant neoplasm of ovary: Secondary | ICD-10-CM | POA: Diagnosis not present

## 2019-01-06 DIAGNOSIS — F17210 Nicotine dependence, cigarettes, uncomplicated: Secondary | ICD-10-CM | POA: Diagnosis not present

## 2019-01-06 DIAGNOSIS — I509 Heart failure, unspecified: Secondary | ICD-10-CM | POA: Diagnosis not present

## 2019-01-06 DIAGNOSIS — Z79899 Other long term (current) drug therapy: Secondary | ICD-10-CM | POA: Diagnosis not present

## 2019-01-06 DIAGNOSIS — Z885 Allergy status to narcotic agent status: Secondary | ICD-10-CM | POA: Diagnosis not present

## 2019-01-06 DIAGNOSIS — I110 Hypertensive heart disease with heart failure: Secondary | ICD-10-CM | POA: Diagnosis not present

## 2019-01-06 DIAGNOSIS — Z833 Family history of diabetes mellitus: Secondary | ICD-10-CM | POA: Diagnosis not present

## 2019-01-06 DIAGNOSIS — Z8249 Family history of ischemic heart disease and other diseases of the circulatory system: Secondary | ICD-10-CM | POA: Diagnosis not present

## 2019-01-06 DIAGNOSIS — Z8042 Family history of malignant neoplasm of prostate: Secondary | ICD-10-CM | POA: Diagnosis not present

## 2019-01-06 DIAGNOSIS — C3432 Malignant neoplasm of lower lobe, left bronchus or lung: Secondary | ICD-10-CM | POA: Diagnosis not present

## 2019-01-09 ENCOUNTER — Telehealth: Payer: Self-pay | Admitting: Pulmonary Disease

## 2019-01-09 MED ORDER — FLUTICASONE-UMECLIDIN-VILANT 100-62.5-25 MCG/INH IN AEPB: 1.0000 | INHALATION_SPRAY | Freq: Every day | RESPIRATORY_TRACT | 0 refills | Status: DC

## 2019-01-09 NOTE — Telephone Encounter (Signed)
She states went to pharmacy to get Trelegy this morning & they are out of stock and don't know when they will get more in. I have called and verified this.  I have called [facility] they have the medication and I have sent the script for one month and the patient was okay with this nothing further is needed at this time.

## 2019-01-12 ENCOUNTER — Other Ambulatory Visit: Payer: Self-pay

## 2019-01-12 ENCOUNTER — Ambulatory Visit
Admission: RE | Admit: 2019-01-12 | Discharge: 2019-01-12 | Disposition: A | Discharge status: Home / Self Care | Payer: Medicare Other | Source: Ambulatory Visit | Attending: Radiation Oncology | Admitting: Radiation Oncology

## 2019-01-12 DIAGNOSIS — I509 Heart failure, unspecified: Secondary | ICD-10-CM | POA: Insufficient documentation

## 2019-01-12 DIAGNOSIS — J449 Chronic obstructive pulmonary disease, unspecified: Secondary | ICD-10-CM | POA: Insufficient documentation

## 2019-01-12 DIAGNOSIS — Z8042 Family history of malignant neoplasm of prostate: Secondary | ICD-10-CM | POA: Insufficient documentation

## 2019-01-12 DIAGNOSIS — F17210 Nicotine dependence, cigarettes, uncomplicated: Secondary | ICD-10-CM | POA: Diagnosis not present

## 2019-01-12 DIAGNOSIS — Z833 Family history of diabetes mellitus: Secondary | ICD-10-CM | POA: Insufficient documentation

## 2019-01-12 DIAGNOSIS — Z8249 Family history of ischemic heart disease and other diseases of the circulatory system: Secondary | ICD-10-CM | POA: Insufficient documentation

## 2019-01-12 DIAGNOSIS — Z79899 Other long term (current) drug therapy: Secondary | ICD-10-CM | POA: Diagnosis not present

## 2019-01-12 DIAGNOSIS — Z8041 Family history of malignant neoplasm of ovary: Secondary | ICD-10-CM | POA: Diagnosis not present

## 2019-01-12 DIAGNOSIS — Z885 Allergy status to narcotic agent status: Secondary | ICD-10-CM | POA: Insufficient documentation

## 2019-01-12 DIAGNOSIS — I110 Hypertensive heart disease with heart failure: Secondary | ICD-10-CM | POA: Diagnosis not present

## 2019-01-12 DIAGNOSIS — R911 Solitary pulmonary nodule: Secondary | ICD-10-CM | POA: Diagnosis not present

## 2019-01-12 NOTE — Progress Notes (Signed)
  Radiation Oncology         [phone]) [phone] ________________________________  Name: Pam Cameron MRN: [id number]  Date: 01/12/2019  DOB: [birth year]  Stereotactic Body Radiotherapy Treatment Procedure Note  NARRATIVE:  Pam Cameron was brought to the stereotactic radiation treatment machine and placed supine on the CT couch. The patient was set up for stereotactic body radiotherapy on the body fix pillow.  3D TREATMENT PLANNING AND DOSIMETRY:  The patient's radiation plan was reviewed and approved prior to starting treatment.  It showed 3-dimensional radiation distributions overlaid onto the planning CT.  The [facility] for the target structures as well as the organs at risk were reviewed. The documentation of this is filed in the radiation oncology EMR.  SIMULATION VERIFICATION:  The patient underwent CT imaging on the treatment unit.  These were carefully aligned to document that the ablative radiation dose would cover the target volume and maximally spare the nearby organs at risk according to the planned distribution.  SPECIAL TREATMENT PROCEDURE: Pam Cameron received high dose ablative stereotactic body radiotherapy to the planned target volume without unforeseen complications. Treatment was delivered uneventfully. The high doses associated with stereotactic body radiotherapy and the significant potential risks require careful treatment set up and patient monitoring constituting a special treatment procedure   STEREOTACTIC TREATMENT MANAGEMENT:  Following delivery, the patient was evaluated clinically. The patient tolerated treatment without significant acute effects, and was discharged to home in stable condition.    PLAN: Continue treatment as planned.  ________________________________  Blair Promise, PhD, MD   This document serves as a record of services personally performed by Gery Pray, MD. It was created on his behalf by Mary-Margaret Loma Messing, a trained medical scribe. The  creation of this record is based on the scribe's personal observations and the provider's statements to them. This document has been checked and approved by the attending provider.

## 2019-01-14 ENCOUNTER — Other Ambulatory Visit: Payer: Self-pay

## 2019-01-14 ENCOUNTER — Ambulatory Visit
Admission: RE | Admit: 2019-01-14 | Discharge: 2019-01-14 | Disposition: A | Discharge status: Home / Self Care | Payer: Medicare Other | Source: Ambulatory Visit | Attending: Radiation Oncology | Admitting: Radiation Oncology

## 2019-01-14 DIAGNOSIS — I110 Hypertensive heart disease with heart failure: Secondary | ICD-10-CM | POA: Diagnosis not present

## 2019-01-14 DIAGNOSIS — Z833 Family history of diabetes mellitus: Secondary | ICD-10-CM | POA: Diagnosis not present

## 2019-01-14 DIAGNOSIS — F17210 Nicotine dependence, cigarettes, uncomplicated: Secondary | ICD-10-CM | POA: Diagnosis not present

## 2019-01-14 DIAGNOSIS — Z8249 Family history of ischemic heart disease and other diseases of the circulatory system: Secondary | ICD-10-CM | POA: Diagnosis not present

## 2019-01-14 DIAGNOSIS — Z8042 Family history of malignant neoplasm of prostate: Secondary | ICD-10-CM | POA: Diagnosis not present

## 2019-01-14 DIAGNOSIS — Z79899 Other long term (current) drug therapy: Secondary | ICD-10-CM | POA: Diagnosis not present

## 2019-01-14 DIAGNOSIS — J449 Chronic obstructive pulmonary disease, unspecified: Secondary | ICD-10-CM | POA: Diagnosis not present

## 2019-01-14 DIAGNOSIS — Z8041 Family history of malignant neoplasm of ovary: Secondary | ICD-10-CM | POA: Diagnosis not present

## 2019-01-14 DIAGNOSIS — Z885 Allergy status to narcotic agent status: Secondary | ICD-10-CM | POA: Diagnosis not present

## 2019-01-14 DIAGNOSIS — R911 Solitary pulmonary nodule: Secondary | ICD-10-CM | POA: Diagnosis not present

## 2019-01-14 DIAGNOSIS — I509 Heart failure, unspecified: Secondary | ICD-10-CM | POA: Diagnosis not present

## 2019-01-16 ENCOUNTER — Ambulatory Visit: Payer: Medicare Other | Admitting: Radiation Oncology

## 2019-01-19 ENCOUNTER — Ambulatory Visit: Payer: Medicare Other | Admitting: Radiation Oncology

## 2019-01-20 ENCOUNTER — Other Ambulatory Visit: Payer: Self-pay

## 2019-01-20 ENCOUNTER — Ambulatory Visit
Admission: RE | Admit: 2019-01-20 | Discharge: 2019-01-20 | Disposition: A | Discharge status: Home / Self Care | Payer: Medicare Other | Source: Ambulatory Visit | Attending: Radiation Oncology | Admitting: Radiation Oncology

## 2019-01-20 DIAGNOSIS — C3432 Malignant neoplasm of lower lobe, left bronchus or lung: Secondary | ICD-10-CM | POA: Diagnosis not present

## 2019-01-20 DIAGNOSIS — Z8249 Family history of ischemic heart disease and other diseases of the circulatory system: Secondary | ICD-10-CM | POA: Diagnosis not present

## 2019-01-20 DIAGNOSIS — Z8041 Family history of malignant neoplasm of ovary: Secondary | ICD-10-CM | POA: Diagnosis not present

## 2019-01-20 DIAGNOSIS — R911 Solitary pulmonary nodule: Secondary | ICD-10-CM

## 2019-01-20 DIAGNOSIS — Z8042 Family history of malignant neoplasm of prostate: Secondary | ICD-10-CM | POA: Diagnosis not present

## 2019-01-20 DIAGNOSIS — Z833 Family history of diabetes mellitus: Secondary | ICD-10-CM | POA: Diagnosis not present

## 2019-01-20 DIAGNOSIS — J449 Chronic obstructive pulmonary disease, unspecified: Secondary | ICD-10-CM | POA: Diagnosis not present

## 2019-01-20 DIAGNOSIS — F17210 Nicotine dependence, cigarettes, uncomplicated: Secondary | ICD-10-CM | POA: Diagnosis not present

## 2019-01-20 DIAGNOSIS — Z885 Allergy status to narcotic agent status: Secondary | ICD-10-CM | POA: Diagnosis not present

## 2019-01-20 DIAGNOSIS — Z79899 Other long term (current) drug therapy: Secondary | ICD-10-CM | POA: Diagnosis not present

## 2019-01-20 DIAGNOSIS — I110 Hypertensive heart disease with heart failure: Secondary | ICD-10-CM | POA: Diagnosis not present

## 2019-01-20 DIAGNOSIS — I509 Heart failure, unspecified: Secondary | ICD-10-CM | POA: Diagnosis not present

## 2019-01-20 NOTE — Progress Notes (Signed)
  Radiation Oncology         [phone]) [phone] ________________________________  Name: Pam Cameron MRN: [id number]  Date: 01/20/2019  DOB: [birth year]  Stereotactic Body Radiotherapy Treatment Procedure Note  NARRATIVE:  Pam Cameron was brought to the stereotactic radiation treatment machine and placed supine on the CT couch. The patient was set up for stereotactic body radiotherapy on the body fix pillow.  3D TREATMENT PLANNING AND DOSIMETRY:  The patient's radiation plan was reviewed and approved prior to starting treatment.  It showed 3-dimensional radiation distributions overlaid onto the planning CT.  The [facility] for the target structures as well as the organs at risk were reviewed. The documentation of this is filed in the radiation oncology EMR.  SIMULATION VERIFICATION:  The patient underwent CT imaging on the treatment unit.  These were carefully aligned to document that the ablative radiation dose would cover the target volume and maximally spare the nearby organs at risk according to the planned distribution.  SPECIAL TREATMENT PROCEDURE: Pam Cameron received high dose ablative stereotactic body radiotherapy to the planned target volume without unforeseen complications. Treatment was delivered uneventfully. The high doses associated with stereotactic body radiotherapy and the significant potential risks require careful treatment set up and patient monitoring constituting a special treatment procedure   STEREOTACTIC TREATMENT MANAGEMENT:  Following delivery, the patient was evaluated clinically. The patient tolerated treatment without significant acute effects, and was discharged to home in stable condition.    PLAN: Continue treatment as planned.  ________________________________  Blair Promise, PhD, MD

## 2019-01-21 ENCOUNTER — Encounter: Payer: Self-pay | Admitting: Radiation Oncology

## 2019-01-21 ENCOUNTER — Ambulatory Visit: Payer: Medicare Other | Admitting: Radiation Oncology

## 2019-01-21 NOTE — Progress Notes (Signed)
  Radiation Oncology         [phone]) [phone] ________________________________  Name: [name] MRN: [id number]  Date: 01/21/2019  DOB: [birth year]  End of Treatment Note  Diagnosis:  Clinical stage IA1 (T1a, N0, M0) PET Positive Left lower pulmonary nodule  Indication for treatment:  Curative       Radiation treatment dates:   4/6, 4/8, 01/20/2019  Site/dose:   Left Lung / 54 Gy in 3 fractions  Beams/energy:   IMRT, photons / 6X-FFF  Narrative: The patient tolerated radiation treatment relatively well. She reported productive cough with clear to yellow sputum. She denied hemoptysis, pain or difficulty swallowing, skin issues, pain, headache, and nausea or vomiting. She noted shortness of breath but attributed it to her COPD.  Plan: The patient has completed radiation treatment. The patient will return to radiation oncology clinic for routine followup in one month. I advised them to call or return sooner if they have any questions or concerns related to their recovery or treatment.  -----------------------------------  Blair Promise, PhD, MD  This document serves as a record of services personally performed by Gery Pray, MD. It was created on his behalf by Wilburn Mylar, a trained medical scribe. The creation of this record is based on the scribe's personal observations and the provider's statements to them. This document has been checked and approved by the attending provider.

## 2019-01-22 ENCOUNTER — Ambulatory Visit: Payer: Medicare Other | Admitting: Radiation Oncology

## 2019-02-25 ENCOUNTER — Telehealth: Payer: Self-pay | Admitting: *Deleted

## 2019-02-25 NOTE — Telephone Encounter (Signed)
CALLED PATIENT TO ALTER FU FOR 02-26-19, RESCHEDULED FOR 05-04-19 @ 11 AM, PATIENT AGREED TO NEW TIME AND DATE

## 2019-02-26 ENCOUNTER — Ambulatory Visit: Payer: Medicare Other | Admitting: Radiation Oncology

## 2019-03-10 ENCOUNTER — Ambulatory Visit: Payer: Medicare Other | Admitting: Pulmonary Disease

## 2019-03-12 ENCOUNTER — Telehealth: Payer: Self-pay | Admitting: *Deleted

## 2019-03-12 NOTE — Telephone Encounter (Signed)
Copied from [facility] [phone]. Topic: Quick Communication - See Telephone Encounter >> Mar 12, 2019  1:45 PM Blase Mess A wrote: CRM for notification. See Telephone encounter for: 03/12/19.  Patient missed call from [facility]. She does not know who called her Please advise. CB- [phone]

## 2019-04-06 ENCOUNTER — Ambulatory Visit (INDEPENDENT_AMBULATORY_CARE_PROVIDER_SITE_OTHER)
Admission: RE | Admit: 2019-04-06 | Discharge: 2019-04-06 | Disposition: A | Discharge status: Home / Self Care | Payer: Medicare Other | Source: Ambulatory Visit | Attending: Pulmonary Disease | Admitting: Pulmonary Disease

## 2019-04-06 ENCOUNTER — Other Ambulatory Visit: Payer: Self-pay | Admitting: Family Medicine

## 2019-04-06 ENCOUNTER — Telehealth: Payer: Self-pay | Admitting: Pulmonary Disease

## 2019-04-06 ENCOUNTER — Other Ambulatory Visit: Payer: Self-pay

## 2019-04-06 DIAGNOSIS — R911 Solitary pulmonary nodule: Secondary | ICD-10-CM | POA: Diagnosis not present

## 2019-04-06 DIAGNOSIS — R918 Other nonspecific abnormal finding of lung field: Secondary | ICD-10-CM | POA: Diagnosis not present

## 2019-04-06 DIAGNOSIS — R600 Localized edema: Secondary | ICD-10-CM

## 2019-04-06 NOTE — Telephone Encounter (Signed)
Called and spoke with pt to clarify if the Trelegy inhaler was the inhaler she was talking about and pt verbalized that was correct. Stated to pt since that was her daily inhaler she is to take, told her to take inhaler prior to appt as prescribed and she verbalized understanding. Nothing further needed.

## 2019-04-07 DIAGNOSIS — H524 Presbyopia: Secondary | ICD-10-CM | POA: Diagnosis not present

## 2019-04-09 ENCOUNTER — Ambulatory Visit: Payer: Medicare Other | Admitting: Pulmonary Disease

## 2019-04-15 ENCOUNTER — Ambulatory Visit: Payer: Medicare Other | Admitting: Pulmonary Disease

## 2019-04-28 ENCOUNTER — Encounter: Payer: Self-pay | Admitting: Pulmonary Disease

## 2019-04-28 ENCOUNTER — Other Ambulatory Visit: Payer: Self-pay

## 2019-04-28 ENCOUNTER — Ambulatory Visit: Payer: Medicare Other | Admitting: Pulmonary Disease

## 2019-04-28 VITALS — BP 110/72 | HR 70 | Temp 97.9°F | Ht 60.0 in | Wt 165.8 lb

## 2019-04-28 DIAGNOSIS — R918 Other nonspecific abnormal finding of lung field: Secondary | ICD-10-CM | POA: Diagnosis not present

## 2019-04-28 DIAGNOSIS — J449 Chronic obstructive pulmonary disease, unspecified: Secondary | ICD-10-CM | POA: Diagnosis not present

## 2019-04-28 DIAGNOSIS — R911 Solitary pulmonary nodule: Secondary | ICD-10-CM

## 2019-04-28 DIAGNOSIS — F17210 Nicotine dependence, cigarettes, uncomplicated: Secondary | ICD-10-CM | POA: Diagnosis not present

## 2019-04-28 DIAGNOSIS — Z720 Tobacco use: Secondary | ICD-10-CM

## 2019-04-28 NOTE — Patient Instructions (Signed)
Thank you for visiting Dr. Valeta Harms at [facility]. Today we recommend the following:  Repeat LDCT to be completed in 12 months, June 2021.  Continue your current inhaler regimen.  Start OTC Vitamin D2 supplement 2000 IU Daily   Return in about 1 year (around 04/27/2020), or if symptoms worsen or fail to improve.

## 2019-04-28 NOTE — Progress Notes (Signed)
Synopsis: Referred in March 2020 for abnormal lung cancer screening CT, abnormal PET scan by Carollee Herter, Alferd Apa, *  Subjective:   PATIENT ID: Pam Cameron GENDER: female DOB: 31-May-1946, MRN: [id number]  Chief Complaint  Patient presents with  . Follow-up    Chest CT 06/29. She reports she has no new concerns. She was started on trelegy at last visit and she reports it made a big difference in her breathing.     This is a 73 year old female longtime smoker.  Currently smoking 1 pack/day.  She had recent pulmonary function tests which reveals severe COPD with an FEV1 postbronchodilator response of 0.88 L.  At baseline she has daily wheezing.  She is not on any inhalers.  She has never been on any medications to help support her breathing.  She has currently no plans to quit smoking.  She was enrolled in our lung cancer screening program.  There was an initial small left lower lobe nodule that was found in November 2019.  She had subsequent low-dose lung cancer screening follow-up which revealed enlargement of the left lower lobe nodule.  She ultimately had PET scan imaging completed that revealed low level PET uptake of 1.76 SUV in the small 8 mm lung nodule.  At this time she does have daily wheezing and cough but she is still continuing to smoke.  Otherwise her respiratory symptoms are at her baseline.  She denies fevers chills night sweats weight loss or nausea vomiting or diarrhea.  OV 04/28/2019: Patient is seen today in follow-up regarding her recent CT imaging.  Left lower lobe lung nodule has decreased in size.  She is very happy about this.  Unfortunately she is still smoking.  She is currently smoking 1 pack/day.  She likes the use of her Trelegy inhaler.  She would also like to continue to follow in our low-dose lung cancer screening program.  She does complain of ongoing lower extremity edema.  She had this last time she was seen in the office.  She does take amlodipine.  As well as a  diuretic.  She states that she will follow-up with her primary care doctor regarding this.  I encouraged her to do so.  Today in the office we also discussed smoking cessation.  She states that she has been thinking about it but.  Recently all of the stress that is going on has making smoking cessation very difficult for her.  She denies fevers chills night sweats nausea vomiting diarrhea or any chest pains.    Past Medical History:  Diagnosis Date  . Anemia   . Blood transfusion   . Cataracts, bilateral   . CHF (congestive heart failure) ([place])   . COPD (chronic obstructive pulmonary disease) ([place])   . Depression   . Hypertension   . Nodule of lower lobe of left lung 12/10/2018  . Pneumonia      Family History  Problem Relation Age of Onset  . Ovarian cancer Mother   . Hypertension Mother   . Cancer Mother        uterine  . Prostate cancer Father   . Hypertension Father   . Cancer Father        lung  . Dementia Father   . Diabetes Sister      Past Surgical History:  Procedure Laterality Date  . APPENDECTOMY    . ESOPHAGOGASTRODUODENOSCOPY ENDOSCOPY    . FIXATION KYPHOPLASTY LUMBAR SPINE  12/14/2011  . KYPHOPLASTY N/A 08/10/2015  Procedure: Thoracic four and thoracic five kyphoplasty;  Surgeon: Kevan Ny Ditty, MD;  Location: [facility];  Service: Neurosurgery;  Laterality: N/A;  T4 and T5 kyphoplasties  . LUNG SURGERY     fluid removed after pneumonia  . TONSILLECTOMY    . TUBAL LIGATION      Social History   Socioeconomic History  . Marital status: Single    Spouse name: Not on file  . Number of children: 0  . Years of education: Not on file  . Highest education level: Not on file  Occupational History  . Occupation: Scientist, clinical (histocompatibility and immunogenetics): [employer]  Social Needs  . Financial resource strain: Not on file  . Food insecurity    Worry: Not on file    Inability: Not on file  . Transportation needs    Medical: Not on file    Non-medical: Not on file   Tobacco Use  . Smoking status: Current Every Day Smoker    Packs/day: 1.50    Years: 50.00    Pack years: 75.00    Types: Cigarettes    Start date: 12/09/1968  . Smokeless tobacco: Never Used  . Tobacco comment: pt does not want to quit  Substance and Sexual Activity  . Alcohol use: Yes    Alcohol/week: 14.0 standard drinks    Types: 14 Standard drinks or equivalent per week  . Drug use: No  . Sexual activity: Yes    Partners: Male  Lifestyle  . Physical activity    Days per week: Not on file    Minutes per session: Not on file  . Stress: Not on file  Relationships  . Social Herbalist on phone: Not on file    Gets together: Not on file    Attends religious service: Not on file    Active member of club or organization: Not on file    Attends meetings of clubs or organizations: Not on file    Relationship status: Not on file  . Intimate partner violence    Fear of current or ex partner: Not on file    Emotionally abused: Not on file    Physically abused: Not on file    Forced sexual activity: Not on file  Other Topics Concern  . Not on file  Social History Narrative   Exercise--no     Allergies  Allergen Reactions  . Codeine     Abdominal cramping     Outpatient Medications Prior to Visit  Medication Sig Dispense Refill  . alendronate (FOSAMAX) 70 MG tablet TAKE 1 TABLET BY MOUTH ONCE A WEEK WITH A FULL GLASS OF WATER ON AN EMPTY STOMACH 12 tablet 3  . amLODipine (NORVASC) 5 MG tablet TAKE 1 TABLET(5 MG) BY MOUTH DAILY 90 tablet 1  . flintstones complete (FLINTSTONES) 60 MG chewable tablet Chew 1 tablet by mouth daily.      . Fluticasone-Umeclidin-Vilant (TRELEGY ELLIPTA) 100-62.5-25 MCG/INH AEPB Inhale 1 puff into the lungs daily. 1 each 0  . hydrochlorothiazide (HYDRODIURIL) 25 MG tablet TAKE 1 TABLET(25 MG) BY MOUTH DAILY 90 tablet 0  . metoprolol succinate (TOPROL-XL) 25 MG 24 hr tablet TAKE 1 TABLET(25 MG) BY MOUTH DAILY 90 tablet 0  .  budesonide-formoterol (SYMBICORT) 80-4.5 MCG/ACT inhaler Inhale 2 puffs into the lungs 2 (two) times daily. (Patient not taking: Reported on 12/10/2018) 1 Inhaler 3  . Fluticasone-Umeclidin-Vilant (TRELEGY ELLIPTA) 100-62.5-25 MCG/INH AEPB Inhale 1 puff into the lungs daily. (Patient not  taking: Reported on 04/28/2019) 60 each 0   No facility-administered medications prior to visit.     Review of Systems  Constitutional: Negative for chills, fever, malaise/fatigue and weight loss.  HENT: Negative for hearing loss, sore throat and tinnitus.   Eyes: Negative for blurred vision and double vision.  Respiratory: Negative for cough, hemoptysis, sputum production, shortness of breath, wheezing and stridor.   Cardiovascular: Negative for chest pain, palpitations, orthopnea, leg swelling and PND.  Gastrointestinal: Negative for abdominal pain, constipation, diarrhea, heartburn, nausea and vomiting.  Genitourinary: Negative for dysuria, hematuria and urgency.  Musculoskeletal: Negative for joint pain and myalgias.  Skin: Negative for itching and rash.  Neurological: Negative for dizziness, tingling, weakness and headaches.  Endo/Heme/Allergies: Negative for environmental allergies. Does not bruise/bleed easily.  Psychiatric/Behavioral: Negative for depression. The patient is not nervous/anxious and does not have insomnia.   All other systems reviewed and are negative.   Objective:  Physical Exam Vitals signs reviewed.  Constitutional:      General: She is not in acute distress.    Appearance: She is well-developed.  HENT:     Head: Normocephalic and atraumatic.     Mouth/Throat:     Pharynx: No oropharyngeal exudate.  Eyes:     Conjunctiva/sclera: Conjunctivae normal.     Pupils: Pupils are equal, round, and reactive to light.  Neck:     Vascular: No JVD.     Trachea: No tracheal deviation.     Comments: Loss of supraclavicular fat Cardiovascular:     Rate and Rhythm: Normal rate and  regular rhythm.     Heart sounds: S1 normal and S2 normal.     Comments: Distant heart tones Pulmonary:     Effort: No tachypnea or accessory muscle usage.     Breath sounds: No stridor. Decreased breath sounds (throughout all lung fields) present. No wheezing, rhonchi or rales.  Abdominal:     General: Bowel sounds are normal. There is no distension.     Palpations: Abdomen is soft.     Tenderness: There is no abdominal tenderness.  Musculoskeletal:     Right lower leg: Edema present.     Left lower leg: Edema present.  Skin:    General: Skin is warm and dry.     Capillary Refill: Capillary refill takes less than 2 seconds.     Findings: No rash.  Neurological:     Mental Status: She is alert and oriented to person, place, and time.  Psychiatric:        Behavior: Behavior normal.      Vitals:   04/28/19 0927  BP: 110/72  Pulse: 70  Temp: 97.9 F (36.6 C)  TempSrc: Oral  SpO2: 100%  Weight: 165 lb 12.8 oz (75.2 kg)  Height: 5' (1.524 m)   100% on RA BMI Readings from Last 3 Encounters:  04/28/19 32.38 kg/m  12/17/18 29.89 kg/m  12/10/18 29.40 kg/m   Wt Readings from Last 3 Encounters:  04/28/19 165 lb 12.8 oz (75.2 kg)  12/17/18 158 lb 3.2 oz (71.8 kg)  12/10/18 155 lb 9.6 oz (70.6 kg)     CBC    Component Value Date/Time   WBC 5.8 06/20/2017 1030   RBC 4.56 06/20/2017 1030   HGB 15.3 (H) 06/20/2017 1030   HCT 45.5 06/20/2017 1030   PLT 211.0 06/20/2017 1030   MCV 99.8 06/20/2017 1030   MCH 32.9 08/09/2015 0933   MCHC 33.6 06/20/2017 1030   RDW 13.4 06/20/2017 1030  LYMPHSABS 1.3 06/20/2017 1030   MONOABS 0.5 06/20/2017 1030   EOSABS 0.1 06/20/2017 1030   BASOSABS 0.1 06/20/2017 1030    Chest Imaging: 12/05/2018 nuclear medicine pet imaging: 8 mm left lower lobe lung nodule with SUV of 1.76.  Suspicious for small neoplasm.  Does have PET avid uptake and the lesion is less than 1 cm in size.  No additional foci of increased PET  activity.  04/06/2019 CT chest: Left lower lobe lung nodule decreased in size as compared to previous. The patient's images have been independently reviewed by me.    Pulmonary Functions Testing Results: PFT Results Latest Ref Rng & Units 12/02/2018  FVC-Pre L 1.29  FVC-Predicted Pre % 51  FVC-Post L 1.67  FVC-Predicted Post % 66  Pre FEV1/FVC % % 54  Post FEV1/FCV % % 53  FEV1-Pre L 0.70  FEV1-Predicted Pre % 37  FEV1-Post L 0.88  DLCO UNC% % 62  DLCO COR %Predicted % 73  TLC L 4.72  TLC % Predicted % 105  RV % Predicted % 169     FeNO: None  Pathology: None  Echocardiogram: None  Heart Catheterization: None    Assessment & Plan:     ICD-10-CM   1. Stage 3 severe COPD by GOLD classification (HCC)  J44.9   2. Nodule of lower lobe of left lung  R91.1   3. Abnormal findings on diagnostic imaging of lung  R91.8   4. Moderate smoker (20 or less per day)  F17.210   5. Tobacco abuse  Z72.0 Ambulatory Referral for Lung Cancer Scre    Discussion:  This is a 73 year old female, severe COPD, left lower lobe lung nodule with recent decrease in size.  I do think she needs a continued follow-up with this.  She can return to her low-dose lung cancer screening CT.  This can be completed in 1 year from now in June 2021.  We will have her enroll back in the low-dose lung cancer screening program.  She can follow-up with me at this time.  At the completion of this image.  She can continue use of her Trelegy inhaler as well as albuterol as needed for shortness of breath and wheezing  She needs to start a vitamin D supplement to include 2000 IU daily.  Greater than 50% of this patient's 25-minute office visit was been face-to-face discussing above recommendations as well as reviewing CT images with the patient.  We also reviewed her PFTs today again today in the office.  We also discussed smoking cessation counseling.  Greater than 10 minutes of this office visit was also spent  discussing smoking cessation counseling.  She is not ready for this per the patient.  Patient to follow-up in our office in 1 year following her repeat CT images.    Current Outpatient Medications:  .  alendronate (FOSAMAX) 70 MG tablet, TAKE 1 TABLET BY MOUTH ONCE A WEEK WITH A FULL GLASS OF WATER ON AN EMPTY STOMACH, Disp: 12 tablet, Rfl: 3 .  amLODipine (NORVASC) 5 MG tablet, TAKE 1 TABLET(5 MG) BY MOUTH DAILY, Disp: 90 tablet, Rfl: 1 .  flintstones complete (FLINTSTONES) 60 MG chewable tablet, Chew 1 tablet by mouth daily.  , Disp: , Rfl:  .  Fluticasone-Umeclidin-Vilant (TRELEGY ELLIPTA) 100-62.5-25 MCG/INH AEPB, Inhale 1 puff into the lungs daily., Disp: 1 each, Rfl: 0 .  hydrochlorothiazide (HYDRODIURIL) 25 MG tablet, TAKE 1 TABLET(25 MG) BY MOUTH DAILY, Disp: 90 tablet, Rfl: 0 .  metoprolol succinate (  TOPROL-XL) 25 MG 24 hr tablet, TAKE 1 TABLET(25 MG) BY MOUTH DAILY, Disp: 90 tablet, Rfl: 0   Garner Nash, DO [facility] 04/28/2019 10:04 AM

## 2019-04-29 ENCOUNTER — Other Ambulatory Visit: Payer: Self-pay | Admitting: Family Medicine

## 2019-04-29 DIAGNOSIS — M810 Age-related osteoporosis without current pathological fracture: Secondary | ICD-10-CM

## 2019-05-01 ENCOUNTER — Other Ambulatory Visit: Payer: Self-pay | Admitting: Family Medicine

## 2019-05-01 DIAGNOSIS — I10 Essential (primary) hypertension: Secondary | ICD-10-CM

## 2019-05-04 ENCOUNTER — Other Ambulatory Visit: Payer: Self-pay

## 2019-05-04 ENCOUNTER — Encounter: Payer: Self-pay | Admitting: Radiation Oncology

## 2019-05-04 ENCOUNTER — Ambulatory Visit
Admission: RE | Admit: 2019-05-04 | Discharge: 2019-05-04 | Disposition: A | Discharge status: Home / Self Care | Payer: Medicare Other | Source: Ambulatory Visit | Attending: Radiation Oncology | Admitting: Radiation Oncology

## 2019-05-04 VITALS — BP 120/66 | HR 73 | Temp 98.7°F | Resp 20 | Ht 60.0 in | Wt 167.2 lb

## 2019-05-04 DIAGNOSIS — I700 Atherosclerosis of aorta: Secondary | ICD-10-CM | POA: Insufficient documentation

## 2019-05-04 DIAGNOSIS — R911 Solitary pulmonary nodule: Secondary | ICD-10-CM | POA: Diagnosis not present

## 2019-05-04 DIAGNOSIS — C3432 Malignant neoplasm of lower lobe, left bronchus or lung: Secondary | ICD-10-CM | POA: Diagnosis not present

## 2019-05-04 DIAGNOSIS — Z923 Personal history of irradiation: Secondary | ICD-10-CM | POA: Diagnosis not present

## 2019-05-04 DIAGNOSIS — R918 Other nonspecific abnormal finding of lung field: Secondary | ICD-10-CM | POA: Diagnosis not present

## 2019-05-04 DIAGNOSIS — Z79899 Other long term (current) drug therapy: Secondary | ICD-10-CM | POA: Insufficient documentation

## 2019-05-04 DIAGNOSIS — J439 Emphysema, unspecified: Secondary | ICD-10-CM | POA: Insufficient documentation

## 2019-05-04 DIAGNOSIS — J90 Pleural effusion, not elsewhere classified: Secondary | ICD-10-CM | POA: Diagnosis not present

## 2019-05-04 DIAGNOSIS — K8020 Calculus of gallbladder without cholecystitis without obstruction: Secondary | ICD-10-CM | POA: Insufficient documentation

## 2019-05-04 DIAGNOSIS — Z08 Encounter for follow-up examination after completed treatment for malignant neoplasm: Secondary | ICD-10-CM | POA: Diagnosis not present

## 2019-05-04 NOTE — Patient Instructions (Signed)
Coronavirus (COVID-19) Are you at risk?  Are you at risk for the Coronavirus (COVID-19)?  To be considered HIGH RISK for Coronavirus (COVID-19), you have to meet the following criteria:  . Traveled to [place], [place], [place], [place] or [place]; or in the [place] to [place], [place], [place], or [place]; and have fever, cough, and shortness of breath within the last 2 weeks of travel OR . Been in close contact with a person diagnosed with COVID-19 within the last 2 weeks and have fever, cough, and shortness of breath . IF YOU DO NOT MEET THESE CRITERIA, YOU ARE CONSIDERED LOW RISK FOR COVID-19.  What to do if you are HIGH RISK for COVID-19?  . If you are having a medical emergency, call 911. . Seek medical care right away. Before you go to a doctor's office, urgent care or emergency department, call ahead and tell them about your recent travel, contact with someone diagnosed with COVID-19, and your symptoms. You should receive instructions from your physician's office regarding next steps of care.  . When you arrive at healthcare provider, tell the healthcare staff immediately you have returned from visiting [place], [place], [place], [place] or [place]; or traveled in the [place] to [place], [place], [place], or [place]; in the last two weeks or you have been in close contact with a person diagnosed with COVID-19 in the last 2 weeks.   . Tell the health care staff about your symptoms: fever, cough and shortness of breath. . After you have been seen by a medical provider, you will be either: o Tested for (COVID-19) and discharged home on quarantine except to seek medical care if symptoms worsen, and asked to  - Stay home and avoid contact with others until you get your results (4-5 days)  - Avoid travel on public transportation if possible (such as bus, train, or airplane) or o Sent to the Emergency Department by EMS for evaluation, COVID-19 testing, and possible  admission depending on your condition and test results.  What to do if you are LOW RISK for COVID-19?  Reduce your risk of any infection by using the same precautions used for avoiding the common cold or flu:  . Wash your hands often with soap and warm water for at least 20 seconds.  If soap and water are not readily available, use an alcohol-based hand sanitizer with at least 60% alcohol.  . If coughing or sneezing, cover your mouth and nose by coughing or sneezing into the elbow areas of your shirt or coat, into a tissue or into your sleeve (not your hands). . Avoid shaking hands with others and consider head nods or verbal greetings only. . Avoid touching your eyes, nose, or mouth with unwashed hands.  . Avoid close contact with people who are sick. . Avoid places or events with large numbers of people in one location, like concerts or sporting events. . Carefully consider travel plans you have or are making. . If you are planning any travel outside or inside the [place], visit the CDC's Travelers' Health webpage for the latest health notices. . If you have some symptoms but not all symptoms, continue to monitor at home and seek medical attention if your symptoms worsen. . If you are having a medical emergency, call 911.   ADDITIONAL HEALTHCARE OPTIONS FOR PATIENTS  [facility] / e-Visit: [email or web address]         [facility]: [phone]  [facility]: [phone]                   [facility]: [phone]   

## 2019-05-04 NOTE — Progress Notes (Signed)
Radiation Oncology         [phone] ________________________________  Name: Pam Cameron MRN: [id number]  Date: 05/04/2019  DOB: 1945-10-24  Follow-Up Visit Note  CC: Ann Held, DO  Ann Held, *    ICD-10-CM   1. Nodule of lower lobe of left lung  R91.1 CT Chest Wo Contrast    Diagnosis: Clinical stage IA1 (T1a, N0, M0) PET Positive Left lower pulmonary nodule     Interval Since Last Radiation:  3 months, 2 weeks  Radiation treatment dates:   4/6, 4/8, 01/20/2019 (SBRT)  Site/dose:   Left Lung / 54 Gy in 3 fractions   Narrative:  The patient returns today for routine follow-up.  she is doing well overall.   Since they were last seen in the office, they had a CT chest without contrast on 04/06/19 which revealed a significantly smaller lower left pulmonary nodule, with a residual measuring approximately 6 mm. Findings consistent with resolving infection or inflammation. There were multiple new clustered small pulmonary nodules of the medial inferior right upper lobe. There were additional stable, benign small nodules of the right lower lobe. Findings nonspecific and consistent with atypical infection or inflammation, including atypical mycobacterial infection.                 On review of systems, she denies any cough has not noted any blood in sputum or SOB. Patient states she is on room air at home. Can walk down the block before she gets SOB. Pertinent positives are listed and detailed within the above HPI.                 ALLERGIES:  is allergic to codeine.  Meds: Current Outpatient Medications  Medication Sig Dispense Refill  . alendronate (FOSAMAX) 70 MG tablet TAKE 1 TABLET BY MOUTH ONCE A WEEK WITH A FULL GLASS OF WATER ON AN EMPTY STOMACH 12 tablet 0  . amLODipine (NORVASC) 10 MG tablet TAKE 1 TABLET(10 MG) BY MOUTH DAILY 30 tablet 0  . flintstones complete (FLINTSTONES) 60 MG chewable tablet Chew 1 tablet by mouth daily.      .  Fluticasone-Umeclidin-Vilant (TRELEGY ELLIPTA) 100-62.5-25 MCG/INH AEPB Inhale 1 puff into the lungs daily. 1 each 0  . hydrochlorothiazide (HYDRODIURIL) 25 MG tablet TAKE 1 TABLET(25 MG) BY MOUTH DAILY 90 tablet 0  . metoprolol succinate (TOPROL-XL) 25 MG 24 hr tablet TAKE 1 TABLET(25 MG) BY MOUTH DAILY 90 tablet 0   No current facility-administered medications for this encounter.     Physical Findings: The patient is in no acute distress. Patient is alert and oriented.  height is 5' (1.524 m) and weight is 167 lb 3.2 oz (75.8 kg). Her oral temperature is 98.7 F (37.1 C). Her blood pressure is 120/66 and her pulse is 73. Her respiration is 20 and oxygen saturation is 99%. .  No significant changes. Lungs are clear to auscultation bilaterally. Heart has regular rate and rhythm. No palpable cervical, supraclavicular, or axillary adenopathy. Abdomen soft, non-tender, normal bowel sounds.  Lab Findings: Lab Results  Component Value Date   WBC 5.8 06/20/2017   HGB 15.3 (H) 06/20/2017   HCT 45.5 06/20/2017   MCV 99.8 06/20/2017   PLT 211.0 06/20/2017    Radiographic Findings: Ct Chest Wo Contrast  Result Date: 04/06/2019 CLINICAL DATA:  Follow-up left lower lobe lung nodule EXAM: CT CHEST WITHOUT CONTRAST TECHNIQUE: Multidetector CT imaging of the chest was performed following the standard protocol  without IV contrast. COMPARISON:  11/25/2018, 08/02/2017 FINDINGS: Cardiovascular: Aortic atherosclerosis. Normal heart size. No pericardial effusion. Mediastinum/Nodes: No enlarged mediastinal, hilar, or axillary lymph nodes. Thyroid gland, trachea, and esophagus demonstrate no significant findings. Lungs/Pleura: Moderate centrilobular emphysema. A previously seen left lower lobe pulmonary nodule has significantly decreased in size and solidity, with a residual measuring approximately 6 mm (series 3, image 78). There are multiple new clustered small pulmonary nodules of the medial inferior right  upper lobe (series 3, image 92). There are additional stable, benign small nodules of the right lower lobe (series 3, image 107). Right basilar consolidation or atelectasis and a trace right pleural effusion. Upper Abdomen: No acute abnormality.  Cholelithiasis. Musculoskeletal: No chest wall mass or suspicious bone lesions identified. Multiple vertebral body kyphoplasties, of the T4, T5, T11, and T12 vertebral bodies. IMPRESSION: 1. A previously seen left lower lobe pulmonary nodule has significantly decreased in size and solidity, with a residual measuring approximately 6 mm (series 3, image 78). Findings are consistent with resolving infection or inflammation. 2. There are multiple new clustered small pulmonary nodules of the medial inferior right upper lobe (series 3, image 92). There are additional stable, benign small nodules of the right lower lobe (series 3, image 107). Findings are nonspecific and consistent with atypical infection or inflammation, including atypical mycobacterial infection. 3. Right basilar consolidation or atelectasis and a trace right pleural effusion. 4.  Emphysema. 5.  Aortic atherosclerosis. 6.  Cholelithiasis. Electronically Signed   By: Eddie Candle M.D.   On: 04/06/2019 15:20    Impression: Patient tolerated her stereotactic body radiation therapy quite well without any residual side effects at this time.  Plan: She will be scheduled for CT scan in December or January and then follow-up soon afterward.  The patient will then proceed with additional lung cancer screening CT scan ~ 1 year with Dr. Valeta Harms.  ____________________________________   Blair Promise, PhD, MD    This document serves as a record of services personally performed by Gery Pray, MD. It was created on his behalf by Mary-Margaret Loma Messing, a trained medical scribe. The creation of this record is based on the scribe's personal observations and the provider's statements to them. This document has been  checked and approved by the attending provider.

## 2019-05-04 NOTE — Progress Notes (Signed)
Patient in for follow up doing well. Denies any cough has not noted any blood in sputum or SOB. Patient states she is on room air at home. Can walk down the block before she gets SOB.

## 2019-06-08 ENCOUNTER — Other Ambulatory Visit: Payer: Self-pay | Admitting: Family Medicine

## 2019-06-08 DIAGNOSIS — I10 Essential (primary) hypertension: Secondary | ICD-10-CM

## 2019-06-11 ENCOUNTER — Other Ambulatory Visit: Payer: Self-pay | Admitting: *Deleted

## 2019-06-11 DIAGNOSIS — I10 Essential (primary) hypertension: Secondary | ICD-10-CM

## 2019-06-11 MED ORDER — AMLODIPINE BESYLATE 10 MG PO TABS: ORAL_TABLET | ORAL | 0 refills | Status: DC

## 2019-06-22 ENCOUNTER — Ambulatory Visit (INDEPENDENT_AMBULATORY_CARE_PROVIDER_SITE_OTHER): Payer: Medicare Other | Admitting: Family Medicine

## 2019-06-22 ENCOUNTER — Encounter: Payer: Self-pay | Admitting: Family Medicine

## 2019-06-22 ENCOUNTER — Other Ambulatory Visit: Payer: Self-pay

## 2019-06-22 DIAGNOSIS — R600 Localized edema: Secondary | ICD-10-CM

## 2019-06-22 DIAGNOSIS — I10 Essential (primary) hypertension: Secondary | ICD-10-CM | POA: Diagnosis not present

## 2019-06-22 DIAGNOSIS — M810 Age-related osteoporosis without current pathological fracture: Secondary | ICD-10-CM

## 2019-06-22 MED ORDER — HYDROCHLOROTHIAZIDE 25 MG PO TABS: ORAL_TABLET | ORAL | 1 refills | Status: DC

## 2019-06-22 MED ORDER — METOPROLOL SUCCINATE ER 25 MG PO TB24: ORAL_TABLET | ORAL | 1 refills | Status: DC

## 2019-06-22 MED ORDER — AMLODIPINE BESYLATE 10 MG PO TABS: ORAL_TABLET | ORAL | 1 refills | Status: DC

## 2019-06-22 MED ORDER — ALENDRONATE SODIUM 70 MG PO TABS: ORAL_TABLET | ORAL | 1 refills | Status: DC

## 2019-06-22 NOTE — Progress Notes (Signed)
   Virtual Visit via Telephone Note  I connected with Pam Cameron on 06/22/19 at 11:00 AM EDT by telephone and verified that I am speaking with the correct person using two identifiers.  Location: Patient: home  Provider: office   I discussed the limitations, risks, security and privacy concerns of performing an evaluation and management service by telephone and the availability of in person appointments. I also discussed with the patient that there may be a patient responsible charge related to this service. The patient expressed understanding and agreed to proceed.   History of Present Illness: Pt  Is home needing refills on meds   No complaints.     Observations/Objective: No vitals obtained today bp 110/72 on 7/21 in the pulm office  Pt in NAD     Assessment and Plan: 1. Essential hypertension Well controlled, no changes to meds. Encouraged heart healthy diet such as the DASH diet and exercise as tolerated.  - metoprolol succinate (TOPROL-XL) 25 MG 24 hr tablet; TAKE 1 TABLET(25 MG) BY MOUTH DAILY.  Needs ov  Dispense: 90 tablet; Refill: 1 - amLODipine (NORVASC) 10 MG tablet; TAKE 1 TABLET(10 MG) BY MOUTH DAILY.  NEEDS OV BEFORE ANY MORE REFILLS  Dispense: 90 tablet; Refill: 1 - Lipid panel; Future - Comprehensive metabolic panel; Future  2. Lower extremity edema Stable Check labs  - hydrochlorothiazide (HYDRODIURIL) 25 MG tablet; TAKE 1 TABLET(25 MG) BY MOUTH DAILY  Dispense: 90 tablet; Refill: 1  3. Osteoporosis, unspecified osteoporosis type, unspecified pathological fracture presence bmd next year  - alendronate (FOSAMAX) 70 MG tablet; Take with a full glass of water on an empty stomach.  Dispense: 12 tablet; Refill: 1   Follow Up Instructions:    I discussed the assessment and treatment plan with the patient. The patient was provided an opportunity to ask questions and all were answered. The patient agreed with the plan and demonstrated an understanding of the  instructions.   The patient was advised to call back or seek an in-person evaluation if the symptoms worsen or if the condition fails to improve as anticipated.  I provided 15 minutes of non-face-to-face time during this encounter.   Ann Held, DO

## 2019-07-01 ENCOUNTER — Other Ambulatory Visit: Payer: Self-pay

## 2019-07-01 ENCOUNTER — Ambulatory Visit (INDEPENDENT_AMBULATORY_CARE_PROVIDER_SITE_OTHER): Payer: Medicare Other | Admitting: *Deleted

## 2019-07-01 ENCOUNTER — Other Ambulatory Visit (INDEPENDENT_AMBULATORY_CARE_PROVIDER_SITE_OTHER): Payer: Medicare Other

## 2019-07-01 DIAGNOSIS — Z23 Encounter for immunization: Secondary | ICD-10-CM

## 2019-07-01 DIAGNOSIS — I10 Essential (primary) hypertension: Secondary | ICD-10-CM | POA: Diagnosis not present

## 2019-07-01 LAB — COMPREHENSIVE METABOLIC PANEL
ALT: 15 U/L (ref 0–35)
AST: 19 U/L (ref 0–37)
Albumin: 4.1 g/dL (ref 3.5–5.2)
Alkaline Phosphatase: 64 U/L (ref 39–117)
BUN: 10 mg/dL (ref 6–23)
CO2: 31 mEq/L (ref 19–32)
Calcium: 8.9 mg/dL (ref 8.4–10.5)
Chloride: 84 mEq/L — ABNORMAL LOW (ref 96–112)
Creatinine, Ser: 0.59 mg/dL (ref 0.40–1.20)
GFR: 100.06 mL/min (ref >60.00)
Glucose, Bld: 93 mg/dL (ref 70–99)
Potassium: 3.3 mEq/L — ABNORMAL LOW (ref 3.5–5.1)
Sodium: 123 mEq/L — ABNORMAL LOW (ref 135–145)
Total Bilirubin: 0.8 mg/dL (ref 0.2–1.2)
Total Protein: 6.7 g/dL (ref 6.0–8.3)

## 2019-07-01 LAB — LIPID PANEL
Cholesterol: 188 mg/dL (ref 0–200)
HDL: 66.6 mg/dL (ref >39.00)
LDL Cholesterol: 106 mg/dL — ABNORMAL HIGH (ref 0–99)
NonHDL: 121.65
Total CHOL/HDL Ratio: 3
Triglycerides: 79 mg/dL (ref 0.0–149.0)
VLDL: 15.8 mg/dL (ref 0.0–40.0)

## 2019-07-01 NOTE — Progress Notes (Signed)
Patient here today for flu vaccine.  Vaccine given and patient tolerated well.

## 2019-07-05 ENCOUNTER — Other Ambulatory Visit: Payer: Self-pay | Admitting: Family Medicine

## 2019-07-05 DIAGNOSIS — E876 Hypokalemia: Secondary | ICD-10-CM

## 2019-07-08 DIAGNOSIS — Z1231 Encounter for screening mammogram for malignant neoplasm of breast: Secondary | ICD-10-CM | POA: Diagnosis not present

## 2019-07-16 ENCOUNTER — Other Ambulatory Visit: Payer: Self-pay

## 2019-07-16 MED ORDER — TRELEGY ELLIPTA 100-62.5-25 MCG/INH IN AEPB: 1.0000 | INHALATION_SPRAY | Freq: Every day | RESPIRATORY_TRACT | 5 refills | Status: AC

## 2019-07-21 ENCOUNTER — Other Ambulatory Visit (INDEPENDENT_AMBULATORY_CARE_PROVIDER_SITE_OTHER): Payer: Medicare Other

## 2019-07-21 ENCOUNTER — Other Ambulatory Visit: Payer: Self-pay

## 2019-07-21 DIAGNOSIS — E876 Hypokalemia: Secondary | ICD-10-CM

## 2019-07-21 LAB — BASIC METABOLIC PANEL
BUN: 8 mg/dL (ref 6–23)
CO2: 30 mEq/L (ref 19–32)
Calcium: 9.8 mg/dL (ref 8.4–10.5)
Chloride: 84 mEq/L — ABNORMAL LOW (ref 96–112)
Creatinine, Ser: 0.63 mg/dL (ref 0.40–1.20)
GFR: 92.75 mL/min (ref >60.00)
Glucose, Bld: 96 mg/dL (ref 70–99)
Potassium: 3.4 mEq/L — ABNORMAL LOW (ref 3.5–5.1)
Sodium: 122 mEq/L — ABNORMAL LOW (ref 135–145)

## 2019-07-28 ENCOUNTER — Other Ambulatory Visit: Payer: Self-pay | Admitting: Family Medicine

## 2019-07-28 DIAGNOSIS — E871 Hypo-osmolality and hyponatremia: Secondary | ICD-10-CM

## 2019-08-31 ENCOUNTER — Other Ambulatory Visit (INDEPENDENT_AMBULATORY_CARE_PROVIDER_SITE_OTHER): Payer: Medicare Other

## 2019-08-31 ENCOUNTER — Other Ambulatory Visit: Payer: Self-pay

## 2019-08-31 DIAGNOSIS — E871 Hypo-osmolality and hyponatremia: Secondary | ICD-10-CM

## 2019-08-31 LAB — BASIC METABOLIC PANEL
BUN: 9 mg/dL (ref 6–23)
CO2: 29 mEq/L (ref 19–32)
Calcium: 9.6 mg/dL (ref 8.4–10.5)
Chloride: 90 mEq/L — ABNORMAL LOW (ref 96–112)
Creatinine, Ser: 0.64 mg/dL (ref 0.40–1.20)
GFR: 91.05 mL/min (ref >60.00)
Glucose, Bld: 94 mg/dL (ref 70–99)
Potassium: 3.6 mEq/L (ref 3.5–5.1)
Sodium: 129 mEq/L — ABNORMAL LOW (ref 135–145)

## 2019-10-06 ENCOUNTER — Telehealth: Payer: Self-pay | Admitting: *Deleted

## 2019-10-06 NOTE — Telephone Encounter (Signed)
CALLED PATIENT TO INFORM OF CT FOR 10-13-19 - ARRIVAL TIME- 2:45 PM @ [facility], NO RESTRICTIONS TO TEST AND PATIENT TO RECEIVE RESULTS FROM DR. KINARD ON 10-15-19 @ 10 AM, SPOKE WITH PATIENT AND SHE IS AWARE OF THESE APPTS.

## 2019-10-13 ENCOUNTER — Other Ambulatory Visit: Payer: Self-pay

## 2019-10-13 ENCOUNTER — Ambulatory Visit (HOSPITAL_COMMUNITY)
Admission: RE | Admit: 2019-10-13 | Discharge: 2019-10-13 | Disposition: A | Discharge status: Home / Self Care | Payer: Medicare Other | Source: Ambulatory Visit | Attending: Radiation Oncology | Admitting: Radiation Oncology

## 2019-10-13 DIAGNOSIS — R911 Solitary pulmonary nodule: Secondary | ICD-10-CM | POA: Diagnosis not present

## 2019-10-13 DIAGNOSIS — C3432 Malignant neoplasm of lower lobe, left bronchus or lung: Secondary | ICD-10-CM | POA: Diagnosis not present

## 2019-10-14 ENCOUNTER — Ambulatory Visit (HOSPITAL_COMMUNITY): Payer: Medicare Other

## 2019-10-15 ENCOUNTER — Other Ambulatory Visit: Payer: Self-pay

## 2019-10-15 ENCOUNTER — Ambulatory Visit
Admission: RE | Admit: 2019-10-15 | Discharge: 2019-10-15 | Disposition: A | Discharge status: Home / Self Care | Payer: Medicare Other | Source: Ambulatory Visit | Attending: Radiation Oncology | Admitting: Radiation Oncology

## 2019-10-15 ENCOUNTER — Encounter: Payer: Self-pay | Admitting: Radiation Oncology

## 2019-10-15 VITALS — BP 134/68 | HR 77 | Temp 97.7°F | Resp 18 | Ht 60.0 in | Wt 171.2 lb

## 2019-10-15 DIAGNOSIS — Z923 Personal history of irradiation: Secondary | ICD-10-CM | POA: Insufficient documentation

## 2019-10-15 DIAGNOSIS — Z08 Encounter for follow-up examination after completed treatment for malignant neoplasm: Secondary | ICD-10-CM | POA: Diagnosis not present

## 2019-10-15 DIAGNOSIS — J439 Emphysema, unspecified: Secondary | ICD-10-CM | POA: Diagnosis not present

## 2019-10-15 DIAGNOSIS — K8020 Calculus of gallbladder without cholecystitis without obstruction: Secondary | ICD-10-CM | POA: Diagnosis not present

## 2019-10-15 DIAGNOSIS — I700 Atherosclerosis of aorta: Secondary | ICD-10-CM | POA: Insufficient documentation

## 2019-10-15 DIAGNOSIS — R911 Solitary pulmonary nodule: Secondary | ICD-10-CM

## 2019-10-15 DIAGNOSIS — C3432 Malignant neoplasm of lower lobe, left bronchus or lung: Secondary | ICD-10-CM | POA: Insufficient documentation

## 2019-10-15 NOTE — Progress Notes (Signed)
Pt presents today for f/u with Dr. Sondra Come to review recent CT scan. Pt denies c/o pain. Pt reports sinus drainage. Pt denies cough. Pt denies hemoptysis. Pt reports "not really any SOB". Pt denies difficulty swallowing.   BP 134/68 (BP Location: Left Arm, Patient Position: Sitting)   Pulse 77   Temp 97.7 F (36.5 C) (Temporal)   Resp 18   Ht 5' (1.524 m)   Wt 171 lb 4 oz (77.7 kg)   SpO2 97%   BMI 33.44 kg/m   Wt Readings from Last 3 Encounters:  10/15/19 171 lb 4 oz (77.7 kg)  05/04/19 167 lb 3.2 oz (75.8 kg)  04/28/19 165 lb 12.8 oz (75.2 kg)   Loma Sousa, RN BSN

## 2019-10-15 NOTE — Patient Instructions (Signed)
Coronavirus (COVID-19) Are you at risk?  Are you at risk for the Coronavirus (COVID-19)?  To be considered HIGH RISK for Coronavirus (COVID-19), you have to meet the following criteria:  . Traveled to [place], [place], [place], [place] or [place]; or in the [place] to [place], [place], [place], or [place]; and have fever, cough, and shortness of breath within the last 2 weeks of travel OR . Been in close contact with a person diagnosed with COVID-19 within the last 2 weeks and have fever, cough, and shortness of breath . IF YOU DO NOT MEET THESE CRITERIA, YOU ARE CONSIDERED LOW RISK FOR COVID-19.  What to do if you are HIGH RISK for COVID-19?  . If you are having a medical emergency, call 911. . Seek medical care right away. Before you go to a doctor's office, urgent care or emergency department, call ahead and tell them about your recent travel, contact with someone diagnosed with COVID-19, and your symptoms. You should receive instructions from your physician's office regarding next steps of care.  . When you arrive at healthcare provider, tell the healthcare staff immediately you have returned from visiting [place], [place], [place], [place] or [place]; or traveled in the [place] to [place], [place], [place], or [place]; in the last two weeks or you have been in close contact with a person diagnosed with COVID-19 in the last 2 weeks.   . Tell the health care staff about your symptoms: fever, cough and shortness of breath. . After you have been seen by a medical provider, you will be either: o Tested for (COVID-19) and discharged home on quarantine except to seek medical care if symptoms worsen, and asked to  - Stay home and avoid contact with others until you get your results (4-5 days)  - Avoid travel on public transportation if possible (such as bus, train, or airplane) or o Sent to the Emergency Department by EMS for evaluation, COVID-19 testing, and possible  admission depending on your condition and test results.  What to do if you are LOW RISK for COVID-19?  Reduce your risk of any infection by using the same precautions used for avoiding the common cold or flu:  . Wash your hands often with soap and warm water for at least 20 seconds.  If soap and water are not readily available, use an alcohol-based hand sanitizer with at least 60% alcohol.  . If coughing or sneezing, cover your mouth and nose by coughing or sneezing into the elbow areas of your shirt or coat, into a tissue or into your sleeve (not your hands). . Avoid shaking hands with others and consider head nods or verbal greetings only. . Avoid touching your eyes, nose, or mouth with unwashed hands.  . Avoid close contact with people who are sick. . Avoid places or events with large numbers of people in one location, like concerts or sporting events. . Carefully consider travel plans you have or are making. . If you are planning any travel outside or inside the [place], visit the CDC's Travelers' Health webpage for the latest health notices. . If you have some symptoms but not all symptoms, continue to monitor at home and seek medical attention if your symptoms worsen. . If you are having a medical emergency, call 911.   ADDITIONAL HEALTHCARE OPTIONS FOR PATIENTS  [facility] / e-Visit: [email or web address]         [facility]: [phone]  [facility]: [phone]                   [facility]: [phone]   

## 2019-10-15 NOTE — Progress Notes (Addendum)
Radiation Oncology         [phone] ________________________________  Name: Pam Cameron MRN: [id number]  Date: 10/15/2019  DOB: 20-Apr-1946  Follow-Up Visit Note  CC: Ann Held, DO  Ann Held, *    ICD-10-CM   1. Nodule of lower lobe of left lung  R91.1     Diagnosis: Clinical stage IA1 (T1a, N0, M0) PET Positive Left lower pulmonary nodule     Interval Since Last Radiation:  8 months, 3 weeks  Radiation treatment dates:   4/6, 4/8, 01/20/2019 (SBRT)  Site/dose:   Left Lung / 54 Gy in 3 fractions   Narrative:  The patient returns today for routine follow-up.    She underwent repeat chest CT on 10/13/2019, which showed: continued decrease in size of treated LLL pulmonary nodule; no evidence of metastatic disease in the chest.  On review of systems, she reports sinus drainage and minimal shortness of breath. She denies pain, cough, hemoptysis, and difficulty swallowing.                ALLERGIES:  is allergic to codeine.  Meds: Current Outpatient Medications  Medication Sig Dispense Refill  . alendronate (FOSAMAX) 70 MG tablet Take with a full glass of water on an empty stomach. 12 tablet 1  . amLODipine (NORVASC) 10 MG tablet TAKE 1 TABLET(10 MG) BY MOUTH DAILY.  NEEDS OV BEFORE ANY MORE REFILLS 90 tablet 1  . flintstones complete (FLINTSTONES) 60 MG chewable tablet Chew 1 tablet by mouth daily.      . hydrochlorothiazide (HYDRODIURIL) 25 MG tablet TAKE 1 TABLET(25 MG) BY MOUTH DAILY 90 tablet 1  . metoprolol succinate (TOPROL-XL) 25 MG 24 hr tablet TAKE 1 TABLET(25 MG) BY MOUTH DAILY.  Needs ov 90 tablet 1  . multivitamin-lutein (OCUVITE-LUTEIN) CAPS capsule Take 1 capsule by mouth daily.    . TRELEGY ELLIPTA 100-62.5-25 MCG/INH AEPB      No current facility-administered medications for this encounter.    Physical Findings: The patient is in no acute distress. Patient is alert and oriented.  height is 5' (1.524 m) and weight is 171 lb 4 oz  (77.7 kg). Her temporal temperature is 97.7 F (36.5 C). Her blood pressure is 134/68 and her pulse is 77. Her respiration is 18 and oxygen saturation is 97%. .  No significant changes. Lungs are clear to auscultation bilaterally. Heart has regular rate and rhythm. No palpable cervical, supraclavicular, or axillary adenopathy. Abdomen soft, non-tender, normal bowel sounds.  Lab Findings: Lab Results  Component Value Date   WBC 5.8 06/20/2017   HGB 15.3 (H) 06/20/2017   HCT 45.5 06/20/2017   MCV 99.8 06/20/2017   PLT 211.0 06/20/2017    Radiographic Findings: CT Chest Wo Contrast  Result Date: 10/13/2019 CLINICAL DATA:  Stage IA left lower lobe lung cancer treated with radiation therapy completed 01/20/2019. Restaging. EXAM: CT CHEST WITHOUT CONTRAST TECHNIQUE: Multidetector CT imaging of the chest was performed following the standard protocol without IV contrast. COMPARISON:  04/06/2019 chest CT. FINDINGS: Cardiovascular: Normal heart size. No significant pericardial effusion/thickening. Right coronary atherosclerosis. Atherosclerotic nonaneurysmal thoracic aorta. Normal caliber pulmonary arteries. Mediastinum/Nodes: No discrete thyroid nodules. Unremarkable esophagus. No pathologically enlarged axillary, mediastinal or hilar lymph nodes, noting limited sensitivity for the detection of hilar adenopathy on this noncontrast study. Lungs/Pleura: No pneumothorax. No pleural effusion. Moderate centrilobular and paraseptal emphysema with mild diffuse bronchial wall thickening. Treated left lower lobe subsolid pulmonary nodule measures 4 mm (series 7/image  76), previously 6 mm, decreased. Patchy bandlike ground-glass opacity surrounding the left lower lobe nodule is compatible with postradiation change. No acute consolidative airspace disease, lung masses or new significant pulmonary nodules. Upper abdomen: Cholelithiasis. Musculoskeletal: No aggressive appearing focal osseous lesions. Chronic mild T4, T5,  T11 and T12 vertebral compression fractures status post vertebroplasty. Mild thoracic spondylosis. IMPRESSION: 1. Treated left lower lobe pulmonary nodule continues to decrease in size. Expected mild postradiation changes surrounding the treated left lower lobe nodule. 2. No evidence of metastatic disease in the chest. 3. Cholelithiasis. Aortic Atherosclerosis (ICD10-I70.0) and Emphysema (ICD10-J43.9). Electronically Signed   By: Ilona Sorrel M.D.   On: 10/13/2019 17:11    Impression:  Clinical stage IA1 (T1a, N0, M0) PET Positive Left lower pulmonary nodule.  Status post stereo tactic body radiation therapy.  CT scan shows favorable response to her radiation therapy.  Patient reports no side effects from her treatment.     Plan: Patient will be scheduled for a CT scan of the chest 6 months from now.  Follow-up soon after this CT scan for examination and review of findings.  Total time spent in this encounter was 25 minutes including physical examination, review of patient's medical record, review of patient's recent chest CT scan, charting and ordering of new CT scan.  ____________________________________   Blair Promise, PhD, MD   This document serves as a record of services personally performed by Gery Pray, MD. It was created on his behalf by Wilburn Mylar, a trained medical scribe. The creation of this record is based on the scribe's personal observations and the provider's statements to them. This document has been checked and approved by the attending provider.

## 2019-11-17 ENCOUNTER — Telehealth: Payer: Self-pay | Admitting: Family Medicine

## 2019-11-17 IMAGING — CT NM PET TUM IMG INITIAL (PI) SKULL BASE T - THIGH
1 of 8 series · 1 of 25 positions shown · non-contrast
Comparison: CT chest, Lung cancer screening 11/25/2018.

CLINICAL DATA: Initial treatment strategy for pulmonary nodule.

EXAM:
NUCLEAR MEDICINE PET SKULL BASE TO THIGH
TECHNIQUE: 7.6 mCi F-18 FDG was injected intravenously. Full-ring PET imaging
was performed from the skull base to thigh after the radiotracer. CT
data was obtained and used for attenuation correction and anatomic
localization.
Fasting blood glucose: 86 mg/dl

[Series 4: ct sk_thigh 5.0 b31f · axial · 5.0mm · 0.98mm/px · 1 of 215 slices shown]
[im 215/215  brain]
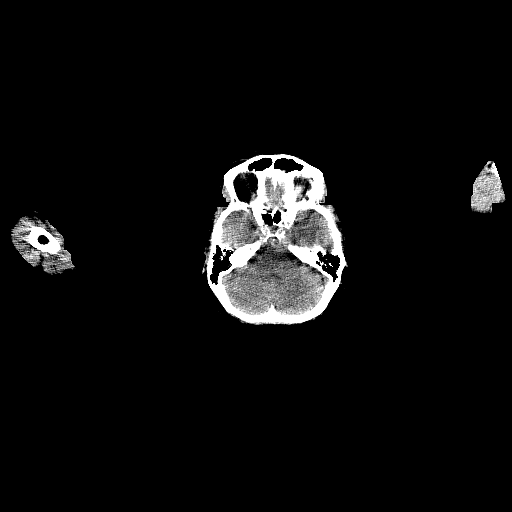

[1 of 25 positions shown; findings below may reference images not displayed]

FINDINGS: Mediastinal blood pool activity: SUV max

NECK: No hypermetabolic lymph nodes in the neck.

Incidental CT findings: none

CHEST: No hypermetabolic axillary or supraclavicular lymph nodes. No
hypermetabolic mediastinal or hilar lymph nodes.

Advanced changes of emphysema with diffuse bronchial wall
thickening. The progressive solid nodule in the left lower lobe is
again noted. On today's study this measures 8 mm and has an SUV max
of 1.76. No additional foci of increased uptake within the chest.

Incidental CT findings: Aortic atherosclerosis.

ABDOMEN/PELVIS: No abnormal uptake identified within the liver,
pancreas, spleen, adrenal glands. There are no hypermetabolic lymph
nodes within the abdomen the or pelvis.

Incidental CT findings: Multiple gallstones are identified within
the gallbladder. Increased caliber of the common bile duct measures
up to 1.5 cm. No choledocholithiasis identified. Aortic
atherosclerosis identified. Large right inguinal hernia contains a
nonobstructed loop of small bowel.

SKELETON: No focal hypermetabolic activity to suggest skeletal
metastasis.

Incidental CT findings: There are multiple compression fractures
identified within the thoracic and lumbar spine which have been
treated with bone cement.
IMPRESSION: 1. 8 mm left lower lobe lung nodule has an SUV max of 1.76. Given
the small size of this nodule (less than 1 cm) this degree of FDG
uptake is suspicious. A small pulmonary neoplasm can not be
excluded. Consider further investigation with tissue sampling.
2. No additional foci of increased uptake to suggest metastatic
disease.
3. Gallstones
4. Aortic atherosclerosis
5. Emphysema
6. Multi level compression deformities within the thoracolumbar
spine status post multilevel vertebral augmentation.

Aortic Atherosclerosis (MZRUL-[id number].P) and Emphysema (MZRUL-KHL.M).

## 2019-11-17 NOTE — Telephone Encounter (Signed)
Yes--- they may watch her longer due to copd

## 2019-11-17 NOTE — Telephone Encounter (Signed)
Please advise 

## 2019-11-17 NOTE — Telephone Encounter (Signed)
Patient would like to know if it's ok to take Covid Vaccine . Please advised due to patient's medical conditions.

## 2019-11-18 NOTE — Telephone Encounter (Signed)
Pt advised.

## 2020-02-02 ENCOUNTER — Other Ambulatory Visit: Payer: Self-pay | Admitting: *Deleted

## 2020-02-02 DIAGNOSIS — M810 Age-related osteoporosis without current pathological fracture: Secondary | ICD-10-CM

## 2020-02-02 MED ORDER — ALENDRONATE SODIUM 70 MG PO TABS: ORAL_TABLET | ORAL | 0 refills | Status: DC

## 2020-02-11 ENCOUNTER — Telehealth: Payer: Self-pay

## 2020-02-11 NOTE — Telephone Encounter (Signed)
Patient called asking when her next CT is due. Pt. Advised will review note/check with Dr. Sondra Come and will call her back.

## 2020-02-15 ENCOUNTER — Telehealth: Payer: Self-pay

## 2020-02-17 ENCOUNTER — Other Ambulatory Visit: Payer: Self-pay | Admitting: Pulmonary Disease

## 2020-02-17 MED ORDER — TRELEGY ELLIPTA 100-62.5-25 MCG/INH IN AEPB: 1.0000 | INHALATION_SPRAY | Freq: Every day | RESPIRATORY_TRACT | 5 refills | Status: DC

## 2020-03-02 ENCOUNTER — Telehealth: Payer: Self-pay | Admitting: Family Medicine

## 2020-03-02 DIAGNOSIS — I10 Essential (primary) hypertension: Secondary | ICD-10-CM

## 2020-03-02 DIAGNOSIS — R600 Localized edema: Secondary | ICD-10-CM

## 2020-03-02 MED ORDER — METOPROLOL SUCCINATE ER 25 MG PO TB24: ORAL_TABLET | ORAL | 0 refills | Status: DC

## 2020-03-02 MED ORDER — HYDROCHLOROTHIAZIDE 25 MG PO TABS: ORAL_TABLET | ORAL | 0 refills | Status: DC

## 2020-03-02 MED ORDER — AMLODIPINE BESYLATE 10 MG PO TABS: ORAL_TABLET | ORAL | 0 refills | Status: DC

## 2020-03-02 NOTE — Telephone Encounter (Signed)
Refill sent.

## 2020-03-02 NOTE — Telephone Encounter (Signed)
Medication: amLODipine (NORVASC) 10 MG tablet [[id number]]   metoprolol succinate (TOPROL-XL) 25 MG 24 hr tablet [[id number]]   hydrochlorothiazide (HYDRODIURIL) 25 MG tablet [[id number]]   Has the patient contacted their pharmacy? No. (If no, request that the patient contact the pharmacy for the refill.) (If yes, when and what did the pharmacy advise?)  Preferred Pharmacy (with phone number or street name): [facility] [place], [address]  [place], [place] [address]  Phone:  [phone] Fax:  [phone]  DEA #:  [id number]  Agent: Please be advised that RX refills may take up to 3 business days. We ask that you follow-up with your pharmacy.

## 2020-03-11 NOTE — Telephone Encounter (Signed)
Error

## 2020-03-17 ENCOUNTER — Telehealth: Payer: Self-pay | Admitting: Pulmonary Disease

## 2020-03-17 NOTE — Telephone Encounter (Signed)
FYI: Per Eric Form, NP this pt will not qualify for lung screening due to lung cancer dx in 2020 with radiation therapy. Dr Sondra Come is scheduled to repeat a chest CT on pt in 04/2020. I will place her on my tickle list for 2025 to possibly restart lung cancer screening.

## 2020-03-21 ENCOUNTER — Other Ambulatory Visit: Payer: Self-pay

## 2020-03-21 ENCOUNTER — Encounter: Payer: Self-pay | Admitting: Family Medicine

## 2020-03-21 ENCOUNTER — Ambulatory Visit (INDEPENDENT_AMBULATORY_CARE_PROVIDER_SITE_OTHER): Payer: Medicare Other | Admitting: Family Medicine

## 2020-03-21 VITALS — BP 118/70 | HR 72 | Temp 97.8°F | Resp 18 | Ht 60.0 in | Wt 170.6 lb

## 2020-03-21 DIAGNOSIS — M810 Age-related osteoporosis without current pathological fracture: Secondary | ICD-10-CM | POA: Diagnosis not present

## 2020-03-21 DIAGNOSIS — Z862 Personal history of diseases of the blood and blood-forming organs and certain disorders involving the immune mechanism: Secondary | ICD-10-CM | POA: Diagnosis not present

## 2020-03-21 DIAGNOSIS — I10 Essential (primary) hypertension: Secondary | ICD-10-CM

## 2020-03-21 DIAGNOSIS — R600 Localized edema: Secondary | ICD-10-CM | POA: Diagnosis not present

## 2020-03-21 DIAGNOSIS — Z720 Tobacco use: Secondary | ICD-10-CM

## 2020-03-21 DIAGNOSIS — J449 Chronic obstructive pulmonary disease, unspecified: Secondary | ICD-10-CM

## 2020-03-21 LAB — COMPREHENSIVE METABOLIC PANEL
ALT: 17 U/L (ref 0–35)
AST: 19 U/L (ref 0–37)
Albumin: 4.3 g/dL (ref 3.5–5.2)
Alkaline Phosphatase: 56 U/L (ref 39–117)
BUN: 11 mg/dL (ref 6–23)
CO2: 30 mEq/L (ref 19–32)
Calcium: 9.8 mg/dL (ref 8.4–10.5)
Chloride: 91 mEq/L — ABNORMAL LOW (ref 96–112)
Creatinine, Ser: 0.64 mg/dL (ref 0.40–1.20)
GFR: 90.91 mL/min (ref >60.00)
Glucose, Bld: 87 mg/dL (ref 70–99)
Potassium: 4.2 mEq/L (ref 3.5–5.1)
Sodium: 127 mEq/L — ABNORMAL LOW (ref 135–145)
Total Bilirubin: 0.6 mg/dL (ref 0.2–1.2)
Total Protein: 7 g/dL (ref 6.0–8.3)

## 2020-03-21 LAB — CBC WITH DIFFERENTIAL/PLATELET
Basophils Absolute: 0.1 10*3/uL (ref 0.0–0.1)
Basophils Relative: 1.3 % (ref 0.0–3.0)
Eosinophils Absolute: 0.1 10*3/uL (ref 0.0–0.7)
Eosinophils Relative: 2.2 % (ref 0.0–5.0)
HCT: 42.5 % (ref 36.0–46.0)
Hemoglobin: 14.9 g/dL (ref 12.0–15.0)
Lymphocytes Relative: 21.1 % (ref 12.0–46.0)
Lymphs Abs: 1.2 10*3/uL (ref 0.7–4.0)
MCHC: 35.1 g/dL (ref 30.0–36.0)
MCV: 95.5 fl (ref 78.0–100.0)
Monocytes Absolute: 0.6 10*3/uL (ref 0.1–1.0)
Monocytes Relative: 11.1 % (ref 3.0–12.0)
Neutro Abs: 3.5 10*3/uL (ref 1.4–7.7)
Neutrophils Relative %: 64.3 % (ref 43.0–77.0)
Platelets: 225 10*3/uL (ref 150.0–400.0)
RBC: 4.45 Mil/uL (ref 3.87–5.11)
RDW: 13.4 % (ref 11.5–15.5)
WBC: 5.5 10*3/uL (ref 4.0–10.5)

## 2020-03-21 LAB — LIPID PANEL
Cholesterol: 214 mg/dL — ABNORMAL HIGH (ref 0–200)
HDL: 70.3 mg/dL (ref >39.00)
LDL Cholesterol: 123 mg/dL — ABNORMAL HIGH (ref 0–99)
NonHDL: 143.69
Total CHOL/HDL Ratio: 3
Triglycerides: 104 mg/dL (ref 0.0–149.0)
VLDL: 20.8 mg/dL (ref 0.0–40.0)

## 2020-03-21 LAB — VITAMIN D 25 HYDROXY (VIT D DEFICIENCY, FRACTURES): VITD: 55.02 ng/mL (ref 30.00–100.00)

## 2020-03-21 MED ORDER — AMLODIPINE BESYLATE 10 MG PO TABS: ORAL_TABLET | ORAL | 1 refills | Status: DC

## 2020-03-21 MED ORDER — ALENDRONATE SODIUM 70 MG PO TABS: ORAL_TABLET | ORAL | 0 refills | Status: DC

## 2020-03-21 MED ORDER — HYDROCHLOROTHIAZIDE 25 MG PO TABS: ORAL_TABLET | ORAL | 1 refills | Status: DC

## 2020-03-21 MED ORDER — METOPROLOL SUCCINATE ER 25 MG PO TB24: ORAL_TABLET | ORAL | 1 refills | Status: DC

## 2020-03-21 NOTE — Progress Notes (Signed)
Patient ID: Pam Cameron, female    DOB: 01-Jan-1946  Age: 74 y.o. MRN: [id number]    Subjective:  Subjective  HPI Pam Cameron presents for f/u bp , osteoporosis.  She sees pulmonary for copd.  She is trying to cut down on her smoking--- she is down to a pack a day.    No complaints   Review of Systems  Constitutional: Negative for appetite change, diaphoresis, fatigue and unexpected weight change.  Eyes: Negative for pain, redness and visual disturbance.  Respiratory: Negative for cough, chest tightness, shortness of breath and wheezing.   Cardiovascular: Negative for chest pain, palpitations and leg swelling.  Endocrine: Negative for cold intolerance, heat intolerance, polydipsia, polyphagia and polyuria.  Genitourinary: Negative for difficulty urinating, dysuria and frequency.  Neurological: Negative for dizziness, light-headedness, numbness and headaches.    History Past Medical History:  Diagnosis Date  . Anemia   . Blood transfusion   . Cataracts, bilateral   . CHF (congestive heart failure) ([place])   . COPD (chronic obstructive pulmonary disease) ([place])   . Depression   . Hypertension   . Nodule of lower lobe of left lung 12/10/2018  . Pneumonia     She has a past surgical history that includes Appendectomy; Tonsillectomy; Lung surgery; Fixation kyphoplasty lumbar spine (12/14/2011); Tubal ligation; Esophagogastroduodenoscopy endoscopy; and Kyphoplasty (N/A, 08/10/2015).   Her family history includes Cancer in her father and mother; Dementia in her father; Diabetes in her sister; Hypertension in her father and mother; Ovarian cancer in her mother; Prostate cancer in her father.She reports that she has been smoking cigarettes. She started smoking about 51 years ago. She has a 51.00 pack-year smoking history. She has never used smokeless tobacco. She reports current alcohol use of about 14.0 standard drinks of alcohol per week. She reports that she does not use drugs.  Current  Outpatient Medications on File Prior to Visit  Medication Sig Dispense Refill  . flintstones complete (FLINTSTONES) 60 MG chewable tablet Chew 1 tablet by mouth daily.      . multivitamin-lutein (OCUVITE-LUTEIN) CAPS capsule Take 1 capsule by mouth daily.    . TRELEGY ELLIPTA 100-62.5-25 MCG/INH AEPB Inhale 1 puff into the lungs daily. 60 each 5   No current facility-administered medications on file prior to visit.     Objective:  Objective  Physical Exam Vitals and nursing note reviewed.  Constitutional:      Appearance: She is well-developed.  HENT:     Head: Normocephalic and atraumatic.  Eyes:     Conjunctiva/sclera: Conjunctivae normal.  Neck:     Thyroid: No thyromegaly.     Vascular: No carotid bruit or JVD.  Cardiovascular:     Rate and Rhythm: Normal rate and regular rhythm.     Heart sounds: Normal heart sounds. No murmur heard.   Pulmonary:     Effort: Pulmonary effort is normal. No respiratory distress.     Breath sounds: Normal breath sounds. No wheezing or rales.  Chest:     Chest wall: No tenderness.  Musculoskeletal:     Cervical back: Normal range of motion and neck supple.  Neurological:     Mental Status: She is alert and oriented to person, place, and time.    BP 118/70 (BP Location: Right Arm, Patient Position: Sitting, Cuff Size: Large)   Pulse 72   Temp 97.8 F (36.6 C) (Temporal)   Resp 18   Ht 5' (1.524 m)   Wt 170 lb 9.6 oz (77.4  kg)   SpO2 97%   BMI 33.32 kg/m  Wt Readings from Last 3 Encounters:  03/21/20 170 lb 9.6 oz (77.4 kg)  10/15/19 171 lb 4 oz (77.7 kg)  05/04/19 167 lb 3.2 oz (75.8 kg)     Lab Results  Component Value Date   WBC 5.8 06/20/2017   HGB 15.3 (H) 06/20/2017   HCT 45.5 06/20/2017   PLT 211.0 06/20/2017   GLUCOSE 94 08/31/2019   CHOL 188 07/01/2019   TRIG 79.0 07/01/2019   HDL 66.60 07/01/2019   LDLCALC 106 (H) 07/01/2019   ALT 15 07/01/2019   AST 19 07/01/2019   NA 129 (L) 08/31/2019   K 3.6 08/31/2019     CL 90 (L) 08/31/2019   CREATININE 0.64 08/31/2019   BUN 9 08/31/2019   CO2 29 08/31/2019   TSH 0.77 03/24/2012   INR 0.91 12/14/2011   MICROALBUR 2.4 (H) 06/30/2015    No results found.   Assessment & Plan:  Plan  I have changed Pam Cameron's amLODipine and metoprolol succinate. I am also having her maintain her flintstones complete, multivitamin-lutein, Trelegy Ellipta, hydrochlorothiazide, and alendronate.  Meds ordered this encounter  Medications  . amLODipine (NORVASC) 10 MG tablet    Sig: TAKE 1 TABLET(10 MG) BY MOUTH DAILY.    Dispense:  90 tablet    Refill:  1  . hydrochlorothiazide (HYDRODIURIL) 25 MG tablet    Sig: TAKE 1 TABLET(25 MG) BY MOUTH DAILY    Dispense:  90 tablet    Refill:  1  . metoprolol succinate (TOPROL-XL) 25 MG 24 hr tablet    Sig: TAKE 1 TABLET(25 MG) BY MOUTH DAILY.    Dispense:  90 tablet    Refill:  1  . alendronate (FOSAMAX) 70 MG tablet    Sig: Take with a full glass of water on an empty stomach.  NEED OV/FOLLOW UP BEFORE ANY MORE REFILLS    Dispense:  12 tablet    Refill:  0    Pt needs follow visit or virtual.    Problem List Items Addressed This Visit      Unprioritized   COPD (chronic obstructive pulmonary disease) ([place])    Per pulm      Essential hypertension    Well controlled, no changes to meds. Encouraged heart healthy diet such as the DASH diet and exercise as tolerated.       Relevant Medications   amLODipine (NORVASC) 10 MG tablet   hydrochlorothiazide (HYDRODIURIL) 25 MG tablet   metoprolol succinate (TOPROL-XL) 25 MG 24 hr tablet   Other Relevant Orders   Lipid panel   CBC with Differential/Platelet   Comprehensive metabolic panel   History of anemia - Primary   Relevant Orders   Lipid panel   CBC with Differential/Platelet   Comprehensive metabolic panel   Lower extremity edema   Relevant Medications   hydrochlorothiazide (HYDRODIURIL) 25 MG tablet   Other Relevant Orders   Lipid panel   CBC with  Differential/Platelet   Comprehensive metabolic panel   Osteoporosis, postmenopausal    bmd ordered con't fosamax -- weight bearing exercise      Relevant Medications   alendronate (FOSAMAX) 70 MG tablet   Tobacco abuse    Pt is con't to try to cut down        Other Visit Diagnoses    Osteoporosis, unspecified osteoporosis type, unspecified pathological fracture presence       Relevant Medications   alendronate (FOSAMAX) 70 MG  tablet   Other Relevant Orders   DG Bone Density   Vitamin D (25 hydroxy)      Follow-up: Return in about 6 months (around 09/20/2020), or if symptoms worsen or fail to improve, for fasting, annual exam.  Ann Held, DO

## 2020-03-21 NOTE — Patient Instructions (Signed)
DASH Eating Plan DASH stands for "Dietary Approaches to Stop Hypertension." The DASH eating plan is a healthy eating plan that has been shown to reduce high blood pressure (hypertension). It may also reduce your risk for type 2 diabetes, heart disease, and stroke. The DASH eating plan may also help with weight loss. What are tips for following this plan?  General guidelines  Avoid eating more than 2,300 mg (milligrams) of salt (sodium) a day. If you have hypertension, you may need to reduce your sodium intake to 1,500 mg a day.  Limit alcohol intake to no more than 1 drink a day for nonpregnant women and 2 drinks a day for men. One drink equals 12 oz of beer, 5 oz of wine, or 1 oz of hard liquor.  Work with your health care provider to maintain a healthy body weight or to lose weight. Ask what an ideal weight is for you.  Get at least 30 minutes of exercise that causes your heart to beat faster (aerobic exercise) most days of the week. Activities may include walking, swimming, or biking.  Work with your health care provider or diet and nutrition specialist (dietitian) to adjust your eating plan to your individual calorie needs. Reading food labels   Check food labels for the amount of sodium per serving. Choose foods with less than 5 percent of the Daily Value of sodium. Generally, foods with less than 300 mg of sodium per serving fit into this eating plan.  To find whole grains, look for the word "whole" as the first word in the ingredient list. Shopping  Buy products labeled as "low-sodium" or "no salt added."  Buy fresh foods. Avoid canned foods and premade or frozen meals. Cooking  Avoid adding salt when cooking. Use salt-free seasonings or herbs instead of table salt or sea salt. Check with your health care provider or pharmacist before using salt substitutes.  Do not fry foods. Cook foods using healthy methods such as baking, boiling, grilling, and broiling instead.  Cook with  heart-healthy oils, such as olive, canola, soybean, or sunflower oil. Meal planning  Eat a balanced diet that includes: ? 5 or more servings of fruits and vegetables each day. At each meal, try to fill half of your plate with fruits and vegetables. ? Up to 6-8 servings of whole grains each day. ? Less than 6 oz of lean meat, poultry, or fish each day. A 3-oz serving of meat is about the same size as a deck of cards. One egg equals 1 oz. ? 2 servings of low-fat dairy each day. ? A serving of nuts, seeds, or beans 5 times each week. ? Heart-healthy fats. Healthy fats called Omega-3 fatty acids are found in foods such as flaxseeds and coldwater fish, like sardines, salmon, and mackerel.  Limit how much you eat of the following: ? Canned or prepackaged foods. ? Food that is high in trans fat, such as fried foods. ? Food that is high in saturated fat, such as fatty meat. ? Sweets, desserts, sugary drinks, and other foods with added sugar. ? Full-fat dairy products.  Do not salt foods before eating.  Try to eat at least 2 vegetarian meals each week.  Eat more home-cooked food and less restaurant, buffet, and fast food.  When eating at a restaurant, ask that your food be prepared with less salt or no salt, if possible. What foods are recommended? The items listed may not be a complete list. Talk with your dietitian about   what dietary choices are best for you. Grains Whole-grain or whole-wheat bread. Whole-grain or whole-wheat pasta. Brown rice. Oatmeal. Quinoa. Bulgur. Whole-grain and low-sodium cereals. Pita bread. Low-fat, low-sodium crackers. Whole-wheat flour tortillas. Vegetables Fresh or frozen vegetables (raw, steamed, roasted, or grilled). Low-sodium or reduced-sodium tomato and vegetable juice. Low-sodium or reduced-sodium tomato sauce and tomato paste. Low-sodium or reduced-sodium canned vegetables. Fruits All fresh, dried, or frozen fruit. Canned fruit in natural juice (without  added sugar). Meat and other protein foods Skinless chicken or turkey. Ground chicken or turkey. Pork with fat trimmed off. Fish and seafood. Egg whites. Dried beans, peas, or lentils. Unsalted nuts, nut butters, and seeds. Unsalted canned beans. Lean cuts of beef with fat trimmed off. Low-sodium, lean deli meat. Dairy Low-fat (1%) or fat-free (skim) milk. Fat-free, low-fat, or reduced-fat cheeses. Nonfat, low-sodium ricotta or cottage cheese. Low-fat or nonfat yogurt. Low-fat, low-sodium cheese. Fats and oils Soft margarine without trans fats. Vegetable oil. Low-fat, reduced-fat, or light mayonnaise and salad dressings (reduced-sodium). Canola, safflower, olive, soybean, and sunflower oils. Avocado. Seasoning and other foods Herbs. Spices. Seasoning mixes without salt. Unsalted popcorn and pretzels. Fat-free sweets. What foods are not recommended? The items listed may not be a complete list. Talk with your dietitian about what dietary choices are best for you. Grains Baked goods made with fat, such as croissants, muffins, or some breads. Dry pasta or rice meal packs. Vegetables Creamed or fried vegetables. Vegetables in a cheese sauce. Regular canned vegetables (not low-sodium or reduced-sodium). Regular canned tomato sauce and paste (not low-sodium or reduced-sodium). Regular tomato and vegetable juice (not low-sodium or reduced-sodium). Pickles. Olives. Fruits Canned fruit in a light or heavy syrup. Fried fruit. Fruit in cream or butter sauce. Meat and other protein foods Fatty cuts of meat. Ribs. Fried meat. Bacon. Sausage. Bologna and other processed lunch meats. Salami. Fatback. Hotdogs. Bratwurst. Salted nuts and seeds. Canned beans with added salt. Canned or smoked fish. Whole eggs or egg yolks. Chicken or turkey with skin. Dairy Whole or 2% milk, cream, and half-and-half. Whole or full-fat cream cheese. Whole-fat or sweetened yogurt. Full-fat cheese. Nondairy creamers. Whipped toppings.  Processed cheese and cheese spreads. Fats and oils Butter. Stick margarine. Lard. Shortening. Ghee. Bacon fat. Tropical oils, such as coconut, palm kernel, or palm oil. Seasoning and other foods Salted popcorn and pretzels. Onion salt, garlic salt, seasoned salt, table salt, and sea salt. Worcestershire sauce. Tartar sauce. Barbecue sauce. Teriyaki sauce. Soy sauce, including reduced-sodium. Steak sauce. Canned and packaged gravies. Fish sauce. Oyster sauce. Cocktail sauce. Horseradish that you find on the shelf. Ketchup. Mustard. Meat flavorings and tenderizers. Bouillon cubes. Hot sauce and Tabasco sauce. Premade or packaged marinades. Premade or packaged taco seasonings. Relishes. Regular salad dressings. Where to find more information:  National Heart, Lung, and Blood Institute: www.nhlbi.nih.gov  American Heart Association: www.heart.org Summary  The DASH eating plan is a healthy eating plan that has been shown to reduce high blood pressure (hypertension). It may also reduce your risk for type 2 diabetes, heart disease, and stroke.  With the DASH eating plan, you should limit salt (sodium) intake to 2,300 mg a day. If you have hypertension, you may need to reduce your sodium intake to 1,500 mg a day.  When on the DASH eating plan, aim to eat more fresh fruits and vegetables, whole grains, lean proteins, low-fat dairy, and heart-healthy fats.  Work with your health care provider or diet and nutrition specialist (dietitian) to adjust your eating plan to your   individual calorie needs. This information is not intended to replace advice given to you by your health care provider. Make sure you discuss any questions you have with your health care provider. Document Revised: 09/06/2017 Document Reviewed: 09/17/2016 Elsevier Patient Education  2020 [employer]  

## 2020-03-21 NOTE — Assessment & Plan Note (Signed)
Pt is con't to try to cut down

## 2020-03-21 NOTE — Assessment & Plan Note (Signed)
Well controlled, no changes to meds. Encouraged heart healthy diet such as the DASH diet and exercise as tolerated.  °

## 2020-03-21 NOTE — Assessment & Plan Note (Signed)
Per pulm 

## 2020-03-21 NOTE — Assessment & Plan Note (Signed)
bmd ordered con't fosamax -- weight bearing exercise

## 2020-03-23 ENCOUNTER — Other Ambulatory Visit: Payer: Self-pay | Admitting: Family Medicine

## 2020-03-23 DIAGNOSIS — E871 Hypo-osmolality and hyponatremia: Secondary | ICD-10-CM

## 2020-03-31 ENCOUNTER — Telehealth: Payer: Self-pay | Admitting: *Deleted

## 2020-03-31 NOTE — Telephone Encounter (Signed)
CALLED PATIENT TO INFORM OF CT FOR 04-13-20 - ARRIVAL TIME- 12:15 PM @ [facility], NO RESTRICTIONS TO TEST, PATIENT TO FU WITH DR. KINARD ON  04-14-20 @ 11:45 AM FOR RESULTS, LVM FOR A RETURN CALL

## 2020-04-01 ENCOUNTER — Telehealth: Payer: Self-pay | Admitting: *Deleted

## 2020-04-01 NOTE — Telephone Encounter (Signed)
Returned patient's phone call, spoke with patient 

## 2020-04-06 ENCOUNTER — Other Ambulatory Visit: Payer: Self-pay

## 2020-04-06 ENCOUNTER — Other Ambulatory Visit (INDEPENDENT_AMBULATORY_CARE_PROVIDER_SITE_OTHER): Payer: Medicare Other

## 2020-04-06 DIAGNOSIS — E871 Hypo-osmolality and hyponatremia: Secondary | ICD-10-CM | POA: Diagnosis not present

## 2020-04-06 LAB — BASIC METABOLIC PANEL
BUN: 9 mg/dL (ref 6–23)
CO2: 28 mEq/L (ref 19–32)
Calcium: 9.7 mg/dL (ref 8.4–10.5)
Chloride: 97 mEq/L (ref 96–112)
Creatinine, Ser: 0.66 mg/dL (ref 0.40–1.20)
GFR: 87.73 mL/min (ref >60.00)
Glucose, Bld: 88 mg/dL (ref 70–99)
Potassium: 4.3 mEq/L (ref 3.5–5.1)
Sodium: 133 mEq/L — ABNORMAL LOW (ref 135–145)

## 2020-04-08 ENCOUNTER — Other Ambulatory Visit: Payer: Self-pay | Admitting: Family Medicine

## 2020-04-08 DIAGNOSIS — H524 Presbyopia: Secondary | ICD-10-CM | POA: Diagnosis not present

## 2020-04-08 DIAGNOSIS — E871 Hypo-osmolality and hyponatremia: Secondary | ICD-10-CM

## 2020-04-13 ENCOUNTER — Other Ambulatory Visit: Payer: Self-pay

## 2020-04-13 ENCOUNTER — Ambulatory Visit (HOSPITAL_COMMUNITY)
Admission: RE | Admit: 2020-04-13 | Discharge: 2020-04-13 | Disposition: A | Discharge status: Home / Self Care | Payer: Medicare Other | Source: Ambulatory Visit | Attending: Radiation Oncology | Admitting: Radiation Oncology

## 2020-04-13 DIAGNOSIS — J40 Bronchitis, not specified as acute or chronic: Secondary | ICD-10-CM | POA: Diagnosis not present

## 2020-04-13 DIAGNOSIS — R911 Solitary pulmonary nodule: Secondary | ICD-10-CM | POA: Diagnosis not present

## 2020-04-13 DIAGNOSIS — J432 Centrilobular emphysema: Secondary | ICD-10-CM | POA: Diagnosis not present

## 2020-04-13 DIAGNOSIS — J9809 Other diseases of bronchus, not elsewhere classified: Secondary | ICD-10-CM | POA: Diagnosis not present

## 2020-04-13 DIAGNOSIS — I700 Atherosclerosis of aorta: Secondary | ICD-10-CM | POA: Diagnosis not present

## 2020-04-13 NOTE — Progress Notes (Signed)
Radiation Oncology         [phone] ________________________________  Name: Pam Cameron MRN: [id number]  Date: 04/14/2020  DOB: [birth year]  Follow-Up Visit Note  CC: Ann Held, DO  Ann Held, *    ICD-10-CM   1. Nodule of lower lobe of left lung  R91.1 CT Chest Wo Contrast    Diagnosis: Clinical stage IA1 (T1a, N0, M0) PET Positive Left lower pulmonary nodule     Interval Since Last Radiation: One year, two months, three weeks, and three days.  Radiation treatment dates:  4/6, 4/8, and 01/20/2019 (SBRT)  Site/dose:   Left Lung / 54 Gy in 3 fractions   Narrative:  The patient returns today for routine follow-up. She underwent a restaging chest CT scan yesterday, 04/13/2020, that showed expected evolution of post-radiation change in the left lower lobe. The treated left lower lobe pulmonary nodule was no longer discretely visible. There was no evidence of metastatic disease in the chest.  On review of systems, she reports no complaints. She denies cough, shortness of breath, and weight loss.                ALLERGIES:  is allergic to codeine.  Meds: Current Outpatient Medications  Medication Sig Dispense Refill   alendronate (FOSAMAX) 70 MG tablet Take with a full glass of water on an empty stomach.  NEED OV/FOLLOW UP BEFORE ANY MORE REFILLS 12 tablet 0   amLODipine (NORVASC) 10 MG tablet TAKE 1 TABLET(10 MG) BY MOUTH DAILY. 90 tablet 1   flintstones complete (FLINTSTONES) 60 MG chewable tablet Chew 1 tablet by mouth daily.       hydrochlorothiazide (HYDRODIURIL) 25 MG tablet TAKE 1 TABLET(25 MG) BY MOUTH DAILY 90 tablet 1   metoprolol succinate (TOPROL-XL) 25 MG 24 hr tablet TAKE 1 TABLET(25 MG) BY MOUTH DAILY. 90 tablet 1   multivitamin-lutein (OCUVITE-LUTEIN) CAPS capsule Take 1 capsule by mouth daily.     TRELEGY ELLIPTA 100-62.5-25 MCG/INH AEPB Inhale 1 puff into the lungs daily. 60 each 5   No current facility-administered medications  for this encounter.    Physical Findings: The patient is in no acute distress. Patient is alert and oriented.  height is 5' (1.524 m) and weight is 170 lb 4 oz (77.2 kg). Her oral temperature is 97.7 F (36.5 C). Her blood pressure is 152/78 (abnormal) and her pulse is 75. Her respiration is 18 and oxygen saturation is 98%.  Lungs are clear to auscultation bilaterally except for some very mild wheezing.  heart has regular rate and rhythm. No palpable cervical, supraclavicular, or axillary adenopathy. Abdomen soft, non-tender, normal bowel sounds.   Lab Findings: Lab Results  Component Value Date   WBC 5.5 03/21/2020   HGB 14.9 03/21/2020   HCT 42.5 03/21/2020   MCV 95.5 03/21/2020   PLT 225.0 03/21/2020    Radiographic Findings: CT Chest Wo Contrast  Result Date: 04/14/2020 CLINICAL DATA:  Clinical stage IA hypermetabolic left lower lobe pulmonary nodule status post radiation therapy completed 01/20/2019. Restaging. EXAM: CT CHEST WITHOUT CONTRAST TECHNIQUE: Multidetector CT imaging of the chest was performed following the standard protocol without IV contrast. COMPARISON:  10/13/2019 chest CT. FINDINGS: Cardiovascular: Normal heart size. No significant pericardial effusion/thickening. Atherosclerotic nonaneurysmal thoracic aorta. Normal caliber pulmonary arteries. Mediastinum/Nodes: No discrete thyroid nodules. Unremarkable esophagus. No pathologically enlarged axillary, mediastinal or hilar lymph nodes, noting limited sensitivity for the detection of hilar adenopathy on this noncontrast study. Lungs/Pleura: No pneumothorax. No  pleural effusion. Moderate to severe centrilobular and paraseptal emphysema with diffuse bronchial wall thickening. No acute consolidative airspace disease or lung masses. Sharply marginated patchy bandlike consolidation in the left lower lobe with associated mild volume loss and distortion, compatible with evolving postradiation change. The treated left lower lobe  pulmonary nodule is no longer discretely visualized. No new significant pulmonary nodules. Upper abdomen: Cholelithiasis. Musculoskeletal: No aggressive appearing focal osseous lesions. Chronic T4, T5, T11 and T12 vertebral compression fracture status post vertebroplasty. Mild thoracic spondylosis. IMPRESSION: 1. Expected evolution of postradiation change in the left lower lobe. Treated left lower lobe pulmonary nodule no longer discretely visible. 2. No evidence of metastatic disease in the chest. 3. Cholelithiasis. 4. Aortic Atherosclerosis (ICD10-I70.0) and Emphysema (ICD10-J43.9). Electronically Signed   By: Ilona Sorrel M.D.   On: 04/14/2020 09:21    Impression:  Clinical stage IA1 (T1a, N0, M0) PET Positive Left lower pulmonary nodule.    No clinical evidence of recurrence on exam today. Recent chest CT scan was stable.  Plan: The patient will follow-up with radiation oncology in 6 months.  Prior to this follow-up appointment the patient will undergo a chest CT scan.  Total time spent in this encounter was 15 minutes which included reviewing the patient's most recent chest CT scan, physical examination, and documentation.  ____________________________________   Blair Promise, PhD, MD  This document serves as a record of services personally performed by Gery Pray, MD. It was created on his behalf by Clerance Lav, a trained medical scribe. The creation of this record is based on the scribe's personal observations and the provider's statements to them. This document has been checked and approved by the attending provider.

## 2020-04-14 ENCOUNTER — Other Ambulatory Visit: Payer: Self-pay

## 2020-04-14 ENCOUNTER — Encounter: Payer: Self-pay | Admitting: Radiation Oncology

## 2020-04-14 ENCOUNTER — Ambulatory Visit
Admission: RE | Admit: 2020-04-14 | Discharge: 2020-04-14 | Disposition: A | Discharge status: Home / Self Care | Payer: Medicare Other | Source: Ambulatory Visit | Attending: Radiation Oncology | Admitting: Radiation Oncology

## 2020-04-14 DIAGNOSIS — Z8709 Personal history of other diseases of the respiratory system: Secondary | ICD-10-CM | POA: Diagnosis not present

## 2020-04-14 DIAGNOSIS — Z85118 Personal history of other malignant neoplasm of bronchus and lung: Secondary | ICD-10-CM | POA: Diagnosis not present

## 2020-04-14 DIAGNOSIS — Z923 Personal history of irradiation: Secondary | ICD-10-CM | POA: Insufficient documentation

## 2020-04-14 DIAGNOSIS — R911 Solitary pulmonary nodule: Secondary | ICD-10-CM

## 2020-04-14 DIAGNOSIS — Z08 Encounter for follow-up examination after completed treatment for malignant neoplasm: Secondary | ICD-10-CM | POA: Diagnosis not present

## 2020-04-14 NOTE — Progress Notes (Signed)
Patient here for a f/u visit and to obtain CT results from 04/13/2020. Patient denies any health problems at this time. No cough, shortness of breath, or weight loss.  BP (!) 152/78 (BP Location: Left Arm, Patient Position: Sitting)   Pulse 75   Temp 97.7 F (36.5 C) (Oral)   Resp 18   Ht 5' (1.524 m)   Wt 170 lb 4 oz (77.2 kg)   SpO2 98%   BMI 33.25 kg/m   Wt Readings from Last 3 Encounters:  04/14/20 170 lb 4 oz (77.2 kg)  03/21/20 170 lb 9.6 oz (77.4 kg)  10/15/19 171 lb 4 oz (77.7 kg)

## 2020-04-30 ENCOUNTER — Encounter: Payer: Self-pay | Admitting: Internal Medicine

## 2020-04-30 NOTE — Telephone Encounter (Signed)
This encounter was created in error - please disregard.

## 2020-05-03 ENCOUNTER — Telehealth: Payer: Self-pay | Admitting: Family Medicine

## 2020-05-03 NOTE — Telephone Encounter (Signed)
Left message for patient to call back and schedule Medicare Annual Wellness Visit (AWV) with Nurse Health Advisor   This should be a 81 MINUTE VISIT.  Last AWV 05/31/16

## 2020-05-26 NOTE — Progress Notes (Signed)
I connected with Jerre today by telephone and verified that I am speaking with the correct person using two identifiers. Location patient: home Location provider: work Persons participating in the virtual visit: patient, Marine scientist.    I discussed the limitations, risks, security and privacy concerns of performing an evaluation and management service by telephone and the availability of in person appointments. I also discussed with the patient that there may be a patient responsible charge related to this service. The patient expressed understanding and verbally consented to this telephonic visit.    Interactive audio and video telecommunications were attempted between this provider and patient, however failed, due to patient having technical difficulties OR patient did not have access to video capability.  We continued and completed visit with audio only.  Some vital signs may be absent or patient reported.    Subjective:   Pam Cameron is a 74 y.o. female who presents for Medicare Annual (Subsequent) preventive examination.  Review of Systems    Cardiac Risk Factors include: advanced age (>71men, >26 women);hypertension;sedentary lifestyle;smoking/ tobacco exposure     Objective:     Advanced Directives 05/27/2020 04/14/2020 10/15/2019 05/04/2019 12/17/2018 05/31/2016 08/09/2015  Does Patient Have a Medical Advance Directive? No No No No No No No  Would patient like information on creating a medical advance directive? No - Patient declined No - Patient declined - No - Patient declined - Yes - Educational materials given No - patient declined information    Current Medications (verified) Outpatient Encounter Medications as of 05/27/2020  Medication Sig  . alendronate (FOSAMAX) 70 MG tablet Take with a full glass of water on an empty stomach.  NEED OV/FOLLOW UP BEFORE ANY MORE REFILLS  . amLODipine (NORVASC) 10 MG tablet TAKE 1 TABLET(10 MG) BY MOUTH DAILY.  . flintstones complete  (FLINTSTONES) 60 MG chewable tablet Chew 1 tablet by mouth daily.    . metoprolol succinate (TOPROL-XL) 25 MG 24 hr tablet TAKE 1 TABLET(25 MG) BY MOUTH DAILY.  . multivitamin-lutein (OCUVITE-LUTEIN) CAPS capsule Take 1 capsule by mouth daily.  . TRELEGY ELLIPTA 100-62.5-25 MCG/INH AEPB Inhale 1 puff into the lungs daily.  . hydrochlorothiazide (HYDRODIURIL) 25 MG tablet TAKE 1 TABLET(25 MG) BY MOUTH DAILY (Patient not taking: Reported on 05/27/2020)   No facility-administered encounter medications on file as of 05/27/2020.    Allergies (verified) Codeine   History: Past Medical History:  Diagnosis Date  . Anemia   . Blood transfusion   . Cataracts, bilateral   . CHF (congestive heart failure) ([place])   . COPD (chronic obstructive pulmonary disease) ([place])   . Depression   . Hypertension   . Nodule of lower lobe of left lung 12/10/2018  . Pneumonia    Past Surgical History:  Procedure Laterality Date  . APPENDECTOMY    . ESOPHAGOGASTRODUODENOSCOPY ENDOSCOPY    . FIXATION KYPHOPLASTY LUMBAR SPINE  12/14/2011  . KYPHOPLASTY N/A 08/10/2015   Procedure: Thoracic four and thoracic five kyphoplasty;  Surgeon: Kevan Ny Ditty, MD;  Location: [facility];  Service: Neurosurgery;  Laterality: N/A;  T4 and T5 kyphoplasties  . LUNG SURGERY     fluid removed after pneumonia  . TONSILLECTOMY    . TUBAL LIGATION     Family History  Problem Relation Age of Onset  . Ovarian cancer Mother   . Hypertension Mother   . Cancer Mother        uterine  . Prostate cancer Father   . Hypertension Father   . Cancer  Father        lung  . Dementia Father   . Diabetes Sister    Social History   Socioeconomic History  . Marital status: Single    Spouse name: Not on file  . Number of children: 0  . Years of education: Not on file  . Highest education level: Not on file  Occupational History  . Occupation: Scientist, clinical (histocompatibility and immunogenetics): [employer]  Tobacco Use  . Smoking status: Current Every Day  Smoker    Packs/day: 1.00    Years: 51.00    Pack years: 51.00    Types: Cigarettes    Start date: 12/09/1968  . Smokeless tobacco: Never Used  . Tobacco comment: cutting down   Substance and Sexual Activity  . Alcohol use: Yes    Alcohol/week: 14.0 standard drinks    Types: 14 Standard drinks or equivalent per week  . Drug use: No  . Sexual activity: Yes    Partners: Male  Other Topics Concern  . Not on file  Social History Narrative   Exercise--no   Social Determinants of Health   Financial Resource Strain: Low Risk   . Difficulty of Paying Living Expenses: Not hard at all  Food Insecurity: No Food Insecurity  . Worried About Charity fundraiser in the Last Year: Never true  . Ran Out of Food in the Last Year: Never true  Transportation Needs: No Transportation Needs  . Lack of Transportation (Medical): No  . Lack of Transportation (Non-Medical): No  Physical Activity:   . Days of Exercise per Week: Not on file  . Minutes of Exercise per Session: Not on file  Stress:   . Feeling of Stress : Not on file  Social Connections:   . Frequency of Communication with Friends and Family: Not on file  . Frequency of Social Gatherings with Friends and Family: Not on file  . Attends Religious Services: Not on file  . Active Member of Clubs or Organizations: Not on file  . Attends Archivist Meetings: Not on file  . Marital Status: Not on file    Tobacco Counseling Ready to quit: No Counseling given: No Comment: cutting down    Clinical Intake: Pain : No/denies pain     Activities of Daily Living In your present state of health, do you have any difficulty performing the following activities: 05/27/2020  Hearing? N  Vision? N  Difficulty concentrating or making decisions? N  Walking or climbing stairs? N  Dressing or bathing? N  Doing errands, shopping? N  Preparing Food and eating ? N  Using the Toilet? N  In the past six months, have you accidently leaked  urine? N  Do you have problems with loss of bowel control? N  Managing your Medications? N  Managing your Finances? N  Housekeeping or managing your Housekeeping? N  Some recent data might be hidden    Patient Care Team: Carollee Herter, Alferd Apa, DO as PCP - General Icard, Octavio Graves, DO as Consulting Physician (Pulmonary Disease)  Indicate any recent Medical Services you may have received from other than [facility] providers in the past year (date may be approximate).     Assessment:   This is a routine wellness examination for Charda.  Dietary issues and exercise activities discussed: Current Exercise Habits: The patient does not participate in regular exercise at present, Exercise limited by: None identified Diet (meal preparation, eat out, water intake, caffeinated beverages, dairy products, fruits  and vegetables): well balanced   Goals    . DIET - INCREASE WATER INTAKE      Depression Screen PHQ 2/9 Scores 05/27/2020 05/31/2016 12/07/2014 04/15/2013 03/24/2012  PHQ - 2 Score 0 0 0 0 0    Fall Risk Fall Risk  05/27/2020 05/31/2016 12/07/2014 04/15/2013  Falls in the past year? 0 No No No  Number falls in past yr: 0 - - -  Injury with Fall? 0 - - -  Follow up Education provided;Falls prevention discussed - - -    Any stairs in or around the home? Yes  If so, are there any without handrails? No  Home free of loose throw rugs in walkways, pet beds, electrical cords, etc? Yes  Adequate lighting in your home to reduce risk of falls? Yes   ASSISTIVE DEVICES UTILIZED TO PREVENT FALLS:  Life alert? No  Use of a cane, walker or w/c? No  Grab bars in the bathroom? No  Shower chair or bench in shower? No  Elevated toilet seat or a handicapped toilet? No    Cognitive Function: Ad8 score reviewed for issues:  Issues making decisions:no  Less interest in hobbies / activities:no  Repeats questions, stories (family complaining):no  Trouble using ordinary gadgets (microwave, computer,  phone):no  Forgets the month or year: no  Mismanaging finances: no  Remembering appts:no Daily problems with thinking and/or memory:no Ad8 score is=0     MMSE - Mini Mental State Exam 05/31/2016  Orientation to time 5  Orientation to Place 5  Registration 3  Attention/ Calculation 5  Recall 3  Language- name 2 objects 2  Language- repeat 1  Language- follow 3 step command 3  Language- read & follow direction 1  Write a sentence 1  Copy design 1  Total score 30        Immunizations Immunization History  Administered Date(s) Administered  . Fluad Quad(high Dose 65+) 07/01/2019  . PFIZER SARS-COV-2 Vaccination 11/21/2019, 12/16/2019  . Pneumococcal Conjugate-13 06/30/2015  . Pneumococcal Polysaccharide-23 03/24/2012, 06/20/2017  . Tdap 10/10/2011  . Zoster 03/24/2012    TDAP status: Up to date Flu Vaccine status: Up to date Pneumococcal vaccine status: Up to date Covid-19 vaccine status: Completed vaccines  Qualifies for Shingles Vaccine? Yes   Zostavax completed Yes     Screening Tests Health Maintenance  Topic Date Due  . DEXA SCAN  04/04/2020  . INFLUENZA VACCINE  05/08/2020  . MAMMOGRAM  07/07/2020  . COLONOSCOPY  10/09/2021  . TETANUS/TDAP  10/09/2021  . COVID-19 Vaccine  Completed  . Hepatitis C Screening  Completed  . PNA vac Low Risk Adult  Completed    Health Maintenance  Health Maintenance Due  Topic Date Due  . DEXA SCAN  04/04/2020  . INFLUENZA VACCINE  05/08/2020   Pt reports she will discuss colon cancer screening w/ PCP at next OV. Mammogram status: Completed 9/30/. Repeat every year Bone Density status: Ordered 03/21/20. Pt provided with contact info and advised to call to schedule appt.  Lung Cancer Screening: active order  Additional Screening:  Hepatitis C Screening: does qualify; Completed 06/30/15  Vision Screening: Recommended annual ophthalmology exams for early detection of glaucoma and other disorders of the  eye.  Dental Screening: Recommended annual dental exams for proper oral hygiene  Community Resource Referral / Chronic Care Management: CRR required this visit?  No   CCM required this visit?  No      Plan:    Please schedule your next  medicare wellness visit with me in 1 yr.  Continue to eat heart healthy diet (full of fruits, vegetables, whole grains, lean protein, water--limit salt, fat, and sugar intake) and increase physical activity as tolerated.  Continue doing brain stimulating activities (puzzles, reading, adult coloring books, staying active) to keep memory sharp.     I have personally reviewed and noted the following in the patient's chart:   . Medical and social history . Use of alcohol, tobacco or illicit drugs  . Current medications and supplements . Functional ability and status . Nutritional status . Physical activity . Advanced directives . List of other physicians . Hospitalizations, surgeries, and ER visits in previous 12 months . Vitals . Screenings to include cognitive, depression, and falls . Referrals and appointments  In addition, I have reviewed and discussed with patient certain preventive protocols, quality metrics, and best practice recommendations. A written personalized care plan for preventive services as well as general preventive health recommendations were provided to patient.   Due to this being a telephonic visit, the after visit summary with patients personalized plan was offered to patient via mail or my-chart.Patient was mailed a copy of AVS.   Shela Nevin, RN   05/27/2020

## 2020-05-27 ENCOUNTER — Ambulatory Visit (INDEPENDENT_AMBULATORY_CARE_PROVIDER_SITE_OTHER): Payer: Medicare Other | Admitting: *Deleted

## 2020-05-27 ENCOUNTER — Encounter: Payer: Self-pay | Admitting: *Deleted

## 2020-05-27 ENCOUNTER — Other Ambulatory Visit: Payer: Self-pay

## 2020-05-27 DIAGNOSIS — Z0000 Encounter for general adult medical examination without abnormal findings: Secondary | ICD-10-CM | POA: Diagnosis not present

## 2020-05-27 NOTE — Patient Instructions (Signed)
Please schedule your next medicare wellness visit with me in 1 yr.  Continue to eat heart healthy diet (full of fruits, vegetables, whole grains, lean protein, water--limit salt, fat, and sugar intake) and increase physical activity as tolerated.  Continue doing brain stimulating activities (puzzles, reading, adult coloring books, staying active) to keep memory sharp.    Pam Cameron , Thank you for taking time to come for your Medicare Wellness Visit. I appreciate your ongoing commitment to your health goals. Please review the following plan we discussed and let me know if I can assist you in the future.   These are the goals we discussed: Goals    . DIET - INCREASE WATER INTAKE       This is a list of the screening recommended for you and due dates:  Health Maintenance  Topic Date Due  . DEXA scan (bone density measurement)  04/04/2020  . Flu Shot  05/08/2020  . Mammogram  07/07/2020  . Colon Cancer Screening  10/09/2021  . Tetanus Vaccine  10/09/2021  . COVID-19 Vaccine  Completed  .  Hepatitis C: One time screening is recommended by Center for Disease Control  (CDC) for  adults born from [birth year] through 1964.   Completed  . Pneumonia vaccines  Completed    Preventive Care 41 Years and Older, Female Preventive care refers to lifestyle choices and visits with your health care provider that can promote health and wellness. This includes:  A yearly physical exam. This is also called an annual well check.  Regular dental and eye exams.  Immunizations.  Screening for certain conditions.  Healthy lifestyle choices, such as diet and exercise. What can I expect for my preventive care visit? Physical exam Your health care provider will check:  Height and weight. These may be used to calculate body mass index (BMI), which is a measurement that tells if you are at a healthy weight.  Heart rate and blood pressure.  Your skin for abnormal spots. Counseling Your health care  provider may ask you questions about:  Alcohol, tobacco, and drug use.  Emotional well-being.  Home and relationship well-being.  Sexual activity.  Eating habits.  History of falls.  Memory and ability to understand (cognition).  Work and work Statistician.  Pregnancy and menstrual history. What immunizations do I need?  Influenza (flu) vaccine  This is recommended every year. Tetanus, diphtheria, and pertussis (Tdap) vaccine  You may need a Td booster every 10 years. Varicella (chickenpox) vaccine  You may need this vaccine if you have not already been vaccinated. Zoster (shingles) vaccine  You may need this after age 56. Pneumococcal conjugate (PCV13) vaccine  One dose is recommended after age 55. Pneumococcal polysaccharide (PPSV23) vaccine  One dose is recommended after age 51. Measles, mumps, and rubella (MMR) vaccine  You may need at least one dose of MMR if you were born in 1957 or later. You may also need a second dose. Meningococcal conjugate (MenACWY) vaccine  You may need this if you have certain conditions. Hepatitis A vaccine  You may need this if you have certain conditions or if you travel or work in places where you may be exposed to hepatitis A. Hepatitis B vaccine  You may need this if you have certain conditions or if you travel or work in places where you may be exposed to hepatitis B. Haemophilus influenzae type b (Hib) vaccine  You may need this if you have certain conditions. You may receive vaccines as individual  doses or as more than one vaccine together in one shot (combination vaccines). Talk with your health care provider about the risks and benefits of combination vaccines. What tests do I need? Blood tests  Lipid and cholesterol levels. These may be checked every 5 years, or more frequently depending on your overall health.  Hepatitis C test.  Hepatitis B test. Screening  Lung cancer screening. You may have this screening  every year starting at age 5 if you have a 30-pack-year history of smoking and currently smoke or have quit within the past 15 years.  Colorectal cancer screening. All adults should have this screening starting at age 73 and continuing until age 101. Your health care provider may recommend screening at age 42 if you are at increased risk. You will have tests every 1-10 years, depending on your results and the type of screening test.  Diabetes screening. This is done by checking your blood sugar (glucose) after you have not eaten for a while (fasting). You may have this done every 1-3 years.  Mammogram. This may be done every 1-2 years. Talk with your health care provider about how often you should have regular mammograms.  BRCA-related cancer screening. This may be done if you have a family history of breast, ovarian, tubal, or peritoneal cancers. Other tests  Sexually transmitted disease (STD) testing.  Bone density scan. This is done to screen for osteoporosis. You may have this done starting at age 26. Follow these instructions at home: Eating and drinking  Eat a diet that includes fresh fruits and vegetables, whole grains, lean protein, and low-fat dairy products. Limit your intake of foods with high amounts of sugar, saturated fats, and salt.  Take vitamin and mineral supplements as recommended by your health care provider.  Do not drink alcohol if your health care provider tells you not to drink.  If you drink alcohol: ? Limit how much you have to 0-1 drink a day. ? Be aware of how much alcohol is in your drink. In the [place] one drink equals one 12 oz bottle of beer (355 mL), one 5 oz glass of wine (148 mL), or one 1 oz glass of hard liquor (44 mL). Lifestyle  Take daily care of your teeth and gums.  Stay active. Exercise for at least 30 minutes on 5 or more days each week.  Do not use any products that contain nicotine or tobacco, such as cigarettes, e-cigarettes, and chewing  tobacco. If you need help quitting, ask your health care provider.  If you are sexually active, practice safe sex. Use a condom or other form of protection in order to prevent STIs (sexually transmitted infections).  Talk with your health care provider about taking a low-dose aspirin or statin. What's next?  Go to your health care provider once a year for a well check visit.  Ask your health care provider how often you should have your eyes and teeth checked.  Stay up to date on all vaccines. This information is not intended to replace advice given to you by your health care provider. Make sure you discuss any questions you have with your health care provider. Document Revised: 09/18/2018 Document Reviewed: 09/18/2018 Elsevier Patient Education  2020 [employer].

## 2020-07-13 ENCOUNTER — Encounter: Payer: Self-pay | Admitting: Family Medicine

## 2020-07-13 DIAGNOSIS — Z1231 Encounter for screening mammogram for malignant neoplasm of breast: Secondary | ICD-10-CM | POA: Diagnosis not present

## 2020-07-13 DIAGNOSIS — M8588 Other specified disorders of bone density and structure, other site: Secondary | ICD-10-CM | POA: Diagnosis not present

## 2020-07-13 DIAGNOSIS — M810 Age-related osteoporosis without current pathological fracture: Secondary | ICD-10-CM | POA: Diagnosis not present

## 2020-07-13 LAB — HM MAMMOGRAPHY

## 2020-07-15 ENCOUNTER — Telehealth: Payer: Self-pay | Admitting: *Deleted

## 2020-07-15 NOTE — Telephone Encounter (Signed)
Per Dr. Etter Sjogren bone density showed osteoporosis.  Can see if ins will pay for Prolia or Enenity.  Patient notified of results.  She wanted to know about side effects.  She will discuss with provider at next ov.

## 2020-09-21 ENCOUNTER — Ambulatory Visit: Payer: Medicare Other | Admitting: Pulmonary Disease

## 2020-09-21 ENCOUNTER — Encounter: Payer: Self-pay | Admitting: Pulmonary Disease

## 2020-09-21 ENCOUNTER — Other Ambulatory Visit: Payer: Self-pay

## 2020-09-21 VITALS — BP 128/72 | HR 73 | Temp 97.2°F | Ht 60.0 in | Wt 164.4 lb

## 2020-09-21 DIAGNOSIS — R911 Solitary pulmonary nodule: Secondary | ICD-10-CM

## 2020-09-21 DIAGNOSIS — J449 Chronic obstructive pulmonary disease, unspecified: Secondary | ICD-10-CM

## 2020-09-21 DIAGNOSIS — Z720 Tobacco use: Secondary | ICD-10-CM | POA: Diagnosis not present

## 2020-09-21 MED ORDER — TRELEGY ELLIPTA 100-62.5-25 MCG/INH IN AEPB
1.0000 | INHALATION_SPRAY | Freq: Every day | RESPIRATORY_TRACT | 11 refills | Status: DC
Start: 2020-09-21 — End: 2020-09-21

## 2020-09-21 MED ORDER — TRELEGY ELLIPTA 100-62.5-25 MCG/INH IN AEPB
1.0000 | INHALATION_SPRAY | Freq: Every day | RESPIRATORY_TRACT | 4 refills | Status: AC
Start: 2020-09-21 — End: 2020-12-20

## 2020-09-21 MED ORDER — TRELEGY ELLIPTA 100-62.5-25 MCG/INH IN AEPB: 1.0000 | INHALATION_SPRAY | Freq: Every day | RESPIRATORY_TRACT | 0 refills | Status: DC

## 2020-09-21 NOTE — Progress Notes (Signed)
Synopsis: Referred in March 2020 for abnormal lung cancer screening CT, abnormal PET scan by Carollee Herter, Alferd Apa, *  Subjective:   PATIENT ID: Pam Cameron GENDER: female DOB: 02/12/1946, MRN: [id number]  Chief Complaint  Patient presents with  . Follow-up    1 year follow up.  Doing very well.  Needing refill of trelegy    This is a 74 year old female longtime smoker.  Currently smoking 1 pack/day.  She had recent pulmonary function tests which reveals severe COPD with an FEV1 postbronchodilator response of 0.88 L.  At baseline she has daily wheezing.  She is not on any inhalers.  She has never been on any medications to help support her breathing.  She has currently no plans to quit smoking.  She was enrolled in our lung cancer screening program.  There was an initial small left lower lobe nodule that was found in November 2019.  She had subsequent low-dose lung cancer screening follow-up which revealed enlargement of the left lower lobe nodule.  She ultimately had PET scan imaging completed that revealed low level PET uptake of 1.76 SUV in the small 8 mm lung nodule.  At this time she does have daily wheezing and cough but she is still continuing to smoke.  Otherwise her respiratory symptoms are at her baseline.  She denies fevers chills night sweats weight loss or nausea vomiting or diarrhea.  OV 04/28/2019: Patient is seen today in follow-up regarding her recent CT imaging.  Left lower lobe lung nodule has decreased in size.  She is very happy about this.  Unfortunately she is still smoking.  She is currently smoking 1 pack/day.  She likes the use of her Trelegy inhaler.  She would also like to continue to follow in our low-dose lung cancer screening program.  She does complain of ongoing lower extremity edema.  She had this last time she was seen in the office.  She does take amlodipine.  As well as a diuretic.  She states that she will follow-up with her primary care doctor regarding this.   I encouraged her to do so.  Today in the office we also discussed smoking cessation.  She states that she has been thinking about it but.  Recently all of the stress that is going on has making smoking cessation very difficult for her.  She denies fevers chills night sweats nausea vomiting diarrhea or any chest pains.  OV 09/21/2020: Patient here today for follow-up for severe COPD.  And lung nodules.  Patient's last CT chest was in July 2021 with post radiation changes to the left lower lobe.  No additional suspicious nodules.  Patient underwent SBRT for a clinical stage I lung cancer, presumed due to no tissue diagnosis obtained.  She follows with Dr. Sondra Come to.  At this point we discussed reenrollment in lung cancer screening to start in July 2022.  Overall doing well today from respiratory standpoint no complaints.  Compliant with her current inhaler regimen.  She does need refills for Trelegy.    Past Medical History:  Diagnosis Date  . Anemia   . Blood transfusion   . Cataracts, bilateral   . CHF (congestive heart failure) ([place])   . COPD (chronic obstructive pulmonary disease) ([place])   . Depression   . Hypertension   . Nodule of lower lobe of left lung 12/10/2018  . Pneumonia      Family History  Problem Relation Age of Onset  . Ovarian cancer Mother   .  Hypertension Mother   . Cancer Mother        uterine  . Prostate cancer Father   . Hypertension Father   . Cancer Father        lung  . Dementia Father   . Diabetes Sister      Past Surgical History:  Procedure Laterality Date  . APPENDECTOMY    . ESOPHAGOGASTRODUODENOSCOPY ENDOSCOPY    . FIXATION KYPHOPLASTY LUMBAR SPINE  12/14/2011  . KYPHOPLASTY N/A 08/10/2015   Procedure: Thoracic four and thoracic five kyphoplasty;  Surgeon: Kevan Ny Ditty, MD;  Location: [facility];  Service: Neurosurgery;  Laterality: N/A;  T4 and T5 kyphoplasties  . LUNG SURGERY     fluid removed after pneumonia  . TONSILLECTOMY    . TUBAL  LIGATION      Social History   Socioeconomic History  . Marital status: Single    Spouse name: Not on file  . Number of children: 0  . Years of education: Not on file  . Highest education level: Not on file  Occupational History  . Occupation: Scientist, clinical (histocompatibility and immunogenetics): [employer]  Tobacco Use  . Smoking status: Current Every Day Smoker    Packs/day: 1.00    Years: 51.00    Pack years: 51.00    Types: Cigarettes    Start date: 12/09/1968  . Smokeless tobacco: Never Used  . Tobacco comment: cutting down   Substance and Sexual Activity  . Alcohol use: Yes    Alcohol/week: 14.0 standard drinks    Types: 14 Standard drinks or equivalent per week  . Drug use: No  . Sexual activity: Yes    Partners: Male  Other Topics Concern  . Not on file  Social History Narrative   Exercise--no   Social Determinants of Health   Financial Resource Strain: Low Risk   . Difficulty of Paying Living Expenses: Not hard at all  Food Insecurity: No Food Insecurity  . Worried About Charity fundraiser in the Last Year: Never true  . Ran Out of Food in the Last Year: Never true  Transportation Needs: No Transportation Needs  . Lack of Transportation (Medical): No  . Lack of Transportation (Non-Medical): No  Physical Activity: Not on file  Stress: Not on file  Social Connections: Not on file  Intimate Partner Violence: Not on file     Allergies  Allergen Reactions  . Codeine     Abdominal cramping     Outpatient Medications Prior to Visit  Medication Sig Dispense Refill  . amLODipine (NORVASC) 10 MG tablet TAKE 1 TABLET(10 MG) BY MOUTH DAILY. 90 tablet 1  . Cholecalciferol (VITAMIN D) 50 MCG (2000 UT) CAPS Take 1 capsule by mouth daily.    . flintstones complete (FLINTSTONES) 60 MG chewable tablet Chew 1 tablet by mouth daily.    . metoprolol succinate (TOPROL-XL) 25 MG 24 hr tablet TAKE 1 TABLET(25 MG) BY MOUTH DAILY. 90 tablet 1  . multivitamin-lutein (OCUVITE-LUTEIN) CAPS capsule Take  1 capsule by mouth daily.    . TRELEGY ELLIPTA 100-62.5-25 MCG/INH AEPB Inhale 1 puff into the lungs daily. 60 each 5  . alendronate (FOSAMAX) 70 MG tablet Take with a full glass of water on an empty stomach.  NEED OV/FOLLOW UP BEFORE ANY MORE REFILLS (Patient not taking: Reported on 09/21/2020) 12 tablet 0  . hydrochlorothiazide (HYDRODIURIL) 25 MG tablet TAKE 1 TABLET(25 MG) BY MOUTH DAILY (Patient not taking: No sig reported) 90 tablet 1  No facility-administered medications prior to visit.    Review of Systems  Constitutional: Negative for chills, fever, malaise/fatigue and weight loss.  HENT: Negative for hearing loss, sore throat and tinnitus.   Eyes: Negative for blurred vision and double vision.  Respiratory: Negative for cough, hemoptysis, sputum production, shortness of breath, wheezing and stridor.   Cardiovascular: Negative for chest pain, palpitations, orthopnea, leg swelling and PND.  Gastrointestinal: Negative for abdominal pain, constipation, diarrhea, heartburn, nausea and vomiting.  Genitourinary: Negative for dysuria, hematuria and urgency.  Musculoskeletal: Negative for joint pain and myalgias.  Skin: Negative for itching and rash.  Neurological: Negative for dizziness, tingling, weakness and headaches.  Endo/Heme/Allergies: Negative for environmental allergies. Does not bruise/bleed easily.  Psychiatric/Behavioral: Negative for depression. The patient is not nervous/anxious and does not have insomnia.   All other systems reviewed and are negative.   Objective:  Physical Exam Vitals reviewed.  Constitutional:      General: She is not in acute distress.    Appearance: She is well-developed and well-nourished.  HENT:     Head: Normocephalic and atraumatic.     Mouth/Throat:     Mouth: Oropharynx is clear and moist.  Eyes:     General: No scleral icterus.    Conjunctiva/sclera: Conjunctivae normal.     Pupils: Pupils are equal, round, and reactive to light.   Neck:     Vascular: No JVD.     Trachea: No tracheal deviation.  Cardiovascular:     Rate and Rhythm: Normal rate and regular rhythm.     Pulses: Intact distal pulses.     Heart sounds: Normal heart sounds. No murmur heard.   Pulmonary:     Effort: Pulmonary effort is normal. No tachypnea, accessory muscle usage or respiratory distress.     Breath sounds: Normal breath sounds. No stridor. No wheezing, rhonchi or rales.  Abdominal:     General: Bowel sounds are normal. There is no distension.     Palpations: Abdomen is soft.     Tenderness: There is no abdominal tenderness.  Musculoskeletal:        General: No tenderness or edema.     Cervical back: Neck supple.  Lymphadenopathy:     Cervical: No cervical adenopathy.  Skin:    General: Skin is warm and dry.     Capillary Refill: Capillary refill takes less than 2 seconds.     Findings: No rash.  Neurological:     Mental Status: She is alert and oriented to person, place, and time.  Psychiatric:        Mood and Affect: Mood and affect normal.        Behavior: Behavior normal.      Vitals:   09/21/20 1101  BP: 128/72  Pulse: 73  Temp: (!) 97.2 F (36.2 C)  TempSrc: Tympanic  SpO2: 98%  Weight: 164 lb 6 oz (74.6 kg)  Height: 5' (1.524 m)   98% on RA BMI Readings from Last 3 Encounters:  09/21/20 32.10 kg/m  04/14/20 33.25 kg/m  03/21/20 33.32 kg/m   Wt Readings from Last 3 Encounters:  09/21/20 164 lb 6 oz (74.6 kg)  04/14/20 170 lb 4 oz (77.2 kg)  03/21/20 170 lb 9.6 oz (77.4 kg)     CBC    Component Value Date/Time   WBC 5.5 03/21/2020 0951   RBC 4.45 03/21/2020 0951   HGB 14.9 03/21/2020 0951   HCT 42.5 03/21/2020 0951   PLT 225.0 03/21/2020 0951  MCV 95.5 03/21/2020 0951   MCH 32.9 08/09/2015 0933   MCHC 35.1 03/21/2020 0951   RDW 13.4 03/21/2020 0951   LYMPHSABS 1.2 03/21/2020 0951   MONOABS 0.6 03/21/2020 0951   EOSABS 0.1 03/21/2020 0951   BASOSABS 0.1 03/21/2020 0951    Chest  Imaging: 12/05/2018 nuclear medicine pet imaging: 8 mm left lower lobe lung nodule with SUV of 1.76.  Suspicious for small neoplasm.  Does have PET avid uptake and the lesion is less than 1 cm in size.  No additional foci of increased PET activity.  04/06/2019 CT chest: Left lower lobe lung nodule decreased in size as compared to previous. The patient's images have been independently reviewed by me.    CT scan of the chest July 2021: Postradiation changes to the left lower lobe no other concerning nodules.  Evidence of emphysema. The patient's images have been independently reviewed by me.    Pulmonary Functions Testing Results: PFT Results Latest Ref Rng & Units 12/02/2018  FVC-Pre L 1.29  FVC-Predicted Pre % 51  FVC-Post L 1.67  FVC-Predicted Post % 66  Pre FEV1/FVC % % 54  Post FEV1/FCV % % 53  FEV1-Pre L 0.70  FEV1-Predicted Pre % 37  FEV1-Post L 0.88  DLCO uncorrected ml/min/mmHg 10.55  DLCO UNC% % 62  DLVA Predicted % 73  TLC L 4.72  TLC % Predicted % 105  RV % Predicted % 169     FeNO: None  Pathology: None  Echocardiogram: None  Heart Catheterization: None    Assessment & Plan:     ICD-10-CM   1. Stage 3 severe COPD by GOLD classification ([place])  J44.9 TRELEGY ELLIPTA 100-62.5-25 MCG/INH AEPB    DISCONTINUED: TRELEGY ELLIPTA 100-62.5-25 MCG/INH AEPB  2. Nodule of lower lobe of left lung  R91.1    s/p SBRT Dr. Sondra Come, presumed clinical stage 1a  3. Tobacco abuse  Z72.0     Discussion:  74 year old female severe COPD left upper lobe nodule underwent SBRT for a presumed clinical stage Ia.  She is under CT surveillance at this time.  As for her COPD she is currently managed with Trelegy inhaler.  Plans: 90-day refills for Trelegy inhaler. Continue exercise and activity level. Continue vitamin D supplement. She has a 68-month follow-up CT scan planned in January 2022. I believe after that she can probably go to yearly scans. We are happy to follow her with  low-dose lung cancer screening CT imaging at that point starting in January 2023.  I have placed a referral for this to be enrolled in our lung cancer screening program and January 2023 depending upon her results of her January 2022 scan which I suspect will hopefully be fine.    Current Outpatient Medications:  .  amLODipine (NORVASC) 10 MG tablet, TAKE 1 TABLET(10 MG) BY MOUTH DAILY., Disp: 90 tablet, Rfl: 1 .  Cholecalciferol (VITAMIN D) 50 MCG (2000 UT) CAPS, Take 1 capsule by mouth daily., Disp: , Rfl:  .  flintstones complete (FLINTSTONES) 60 MG chewable tablet, Chew 1 tablet by mouth daily., Disp: , Rfl:  .  metoprolol succinate (TOPROL-XL) 25 MG 24 hr tablet, TAKE 1 TABLET(25 MG) BY MOUTH DAILY., Disp: 90 tablet, Rfl: 1 .  multivitamin-lutein (OCUVITE-LUTEIN) CAPS capsule, Take 1 capsule by mouth daily., Disp: , Rfl:  .  TRELEGY ELLIPTA 100-62.5-25 MCG/INH AEPB, Inhale 1 puff into the lungs daily., Disp: 90 each, Rfl: [place], DO [facility] 09/21/2020 11:21 AM

## 2020-09-21 NOTE — Addendum Note (Signed)
Addended by: Elie Confer on: 09/21/2020 12:03 PM   Modules accepted: Orders

## 2020-09-21 NOTE — Patient Instructions (Addendum)
Thank you for visiting Dr. Valeta Harms at [facility]. Today we recommend the following:  . TRELEGY ELLIPTA 100-62.5-25 MCG/INH AEPB    Sig: Inhale 1 puff into the lungs daily.    Dispense:  90 each    Refill:  4    90 day supply   Return in about 1 year (around 09/21/2021), or if symptoms worsen or fail to improve, for w/ Dr. Valeta Harms .    Please do your part to reduce the spread of COVID-19.

## 2020-09-23 ENCOUNTER — Ambulatory Visit (INDEPENDENT_AMBULATORY_CARE_PROVIDER_SITE_OTHER): Payer: Medicare Other | Admitting: Family Medicine

## 2020-09-23 ENCOUNTER — Other Ambulatory Visit: Payer: Self-pay

## 2020-09-23 ENCOUNTER — Encounter: Payer: Self-pay | Admitting: Family Medicine

## 2020-09-23 VITALS — BP 120/74 | HR 77 | Temp 97.8°F | Resp 18 | Ht 60.0 in | Wt 166.8 lb

## 2020-09-23 DIAGNOSIS — Z0000 Encounter for general adult medical examination without abnormal findings: Secondary | ICD-10-CM | POA: Diagnosis not present

## 2020-09-23 DIAGNOSIS — I10 Essential (primary) hypertension: Secondary | ICD-10-CM

## 2020-09-23 LAB — COMPREHENSIVE METABOLIC PANEL
ALT: 17 U/L (ref 0–35)
AST: 21 U/L (ref 0–37)
Albumin: 4.3 g/dL (ref 3.5–5.2)
Alkaline Phosphatase: 65 U/L (ref 39–117)
BUN: 9 mg/dL (ref 6–23)
CO2: 28 mEq/L (ref 19–32)
Calcium: 9.4 mg/dL (ref 8.4–10.5)
Chloride: 99 mEq/L (ref 96–112)
Creatinine, Ser: 0.7 mg/dL (ref 0.40–1.20)
GFR: 85.62 mL/min (ref >60.00)
Glucose, Bld: 76 mg/dL (ref 70–99)
Potassium: 4.6 mEq/L (ref 3.5–5.1)
Sodium: 133 mEq/L — ABNORMAL LOW (ref 135–145)
Total Bilirubin: 0.7 mg/dL (ref 0.2–1.2)
Total Protein: 7.1 g/dL (ref 6.0–8.3)

## 2020-09-23 LAB — LIPID PANEL
Cholesterol: 210 mg/dL — ABNORMAL HIGH (ref 0–200)
HDL: 68.5 mg/dL (ref >39.00)
LDL Cholesterol: 121 mg/dL — ABNORMAL HIGH (ref 0–99)
NonHDL: 141.15
Total CHOL/HDL Ratio: 3
Triglycerides: 99 mg/dL (ref 0.0–149.0)
VLDL: 19.8 mg/dL (ref 0.0–40.0)

## 2020-09-23 LAB — CBC WITH DIFFERENTIAL/PLATELET
Basophils Absolute: 0.1 10*3/uL (ref 0.0–0.1)
Basophils Relative: 1.2 % (ref 0.0–3.0)
Eosinophils Absolute: 0.1 10*3/uL (ref 0.0–0.7)
Eosinophils Relative: 2.4 % (ref 0.0–5.0)
HCT: 45.5 % (ref 36.0–46.0)
Hemoglobin: 15.2 g/dL — ABNORMAL HIGH (ref 12.0–15.0)
Lymphocytes Relative: 22.4 % (ref 12.0–46.0)
Lymphs Abs: 1.4 10*3/uL (ref 0.7–4.0)
MCHC: 33.4 g/dL (ref 30.0–36.0)
MCV: 97.4 fl (ref 78.0–100.0)
Monocytes Absolute: 0.5 10*3/uL (ref 0.1–1.0)
Monocytes Relative: 8.9 % (ref 3.0–12.0)
Neutro Abs: 4 10*3/uL (ref 1.4–7.7)
Neutrophils Relative %: 65.1 % (ref 43.0–77.0)
Platelets: 203 10*3/uL (ref 150.0–400.0)
RBC: 4.67 Mil/uL (ref 3.87–5.11)
RDW: 13.9 % (ref 11.5–15.5)
WBC: 6.2 10*3/uL (ref 4.0–10.5)

## 2020-09-23 MED ORDER — METOPROLOL SUCCINATE ER 25 MG PO TB24: ORAL_TABLET | ORAL | 3 refills | Status: DC

## 2020-09-23 MED ORDER — AMLODIPINE BESYLATE 10 MG PO TABS: ORAL_TABLET | ORAL | 3 refills | Status: DC

## 2020-09-23 NOTE — Progress Notes (Signed)
Subjective:     Pam Cameron is a 74 y.o. female and is here for a comprehensive physical exam. The patient reports no problems.  Social History   Socioeconomic History  . Marital status: Single    Spouse name: Not on file  . Number of children: 0  . Years of education: Not on file  . Highest education level: Not on file  Occupational History  . Occupation: Scientist, clinical (histocompatibility and immunogenetics): [employer]  Tobacco Use  . Smoking status: Current Every Day Smoker    Packs/day: 0.75    Years: 52.00    Pack years: 39.00    Types: Cigarettes    Start date: 12/09/1968  . Smokeless tobacco: Never Used  . Tobacco comment: cutting down   Substance and Sexual Activity  . Alcohol use: Yes    Alcohol/week: 14.0 standard drinks    Types: 14 Standard drinks or equivalent per week  . Drug use: No  . Sexual activity: Yes    Partners: Male  Other Topics Concern  . Not on file  Social History Narrative   Exercise--no   Social Determinants of Health   Financial Resource Strain: Low Risk   . Difficulty of Paying Living Expenses: Not hard at all  Food Insecurity: No Food Insecurity  . Worried About Charity fundraiser in the Last Year: Never true  . Ran Out of Food in the Last Year: Never true  Transportation Needs: No Transportation Needs  . Lack of Transportation (Medical): No  . Lack of Transportation (Non-Medical): No  Physical Activity: Not on file  Stress: Not on file  Social Connections: Not on file  Intimate Partner Violence: Not on file   Health Maintenance  Topic Date Due  . COVID-19 Vaccine (3 - Pfizer risk 4-dose series) 01/13/2020  . DEXA SCAN  04/04/2020  . INFLUENZA VACCINE  05/08/2020  . MAMMOGRAM  07/13/2021  . COLONOSCOPY  10/09/2021  . TETANUS/TDAP  10/09/2021  . Hepatitis C Screening  Completed  . PNA vac Low Risk Adult  Completed    The following portions of the patient's history were reviewed and updated as appropriate:  She  has a past medical history of Anemia,  Blood transfusion, Cataracts, bilateral, CHF (congestive heart failure) ([place]), COPD (chronic obstructive pulmonary disease) ([place]), Depression, Hypertension, Nodule of lower lobe of left lung (12/10/2018), and Pneumonia. She does not have any pertinent problems on file. She  has a past surgical history that includes Appendectomy; Tonsillectomy; Lung surgery; Fixation kyphoplasty lumbar spine (12/14/2011); Tubal ligation; Esophagogastroduodenoscopy endoscopy; and Kyphoplasty (N/A, 08/10/2015). Her family history includes Cancer in her father and mother; Dementia in her father; Diabetes in her sister; Hypertension in her father and mother; Ovarian cancer in her mother; Prostate cancer in her father. She  reports that she has been smoking cigarettes. She started smoking about 51 years ago. She has a 39.00 pack-year smoking history. She has never used smokeless tobacco. She reports current alcohol use of about 14.0 standard drinks of alcohol per week. She reports that she does not use drugs. She has a current medication list which includes the following prescription(s): vitamin d, flintstones complete, trelegy ellipta, multivitamin-lutein, trelegy ellipta, amlodipine, and metoprolol succinate. Current Outpatient Medications on File Prior to Visit  Medication Sig Dispense Refill  . Cholecalciferol (VITAMIN D) 50 MCG (2000 UT) CAPS Take 1 capsule by mouth daily.    . flintstones complete (FLINTSTONES) 60 MG chewable tablet Chew 1 tablet by mouth daily.    Marland Kitchen  Fluticasone-Umeclidin-Vilant (TRELEGY ELLIPTA) 100-62.5-25 MCG/INH AEPB Inhale 1 puff into the lungs daily. 2 each 0  . multivitamin-lutein (OCUVITE-LUTEIN) CAPS capsule Take 1 capsule by mouth daily.    . TRELEGY ELLIPTA 100-62.5-25 MCG/INH AEPB Inhale 1 puff into the lungs daily. 90 each 4   No current facility-administered medications on file prior to visit.   She is allergic to codeine..  Review of Systems Review of Systems  Constitutional: Negative  for activity change, appetite change and fatigue.  HENT: Negative for hearing loss, congestion, tinnitus and ear discharge.  dentist q71m Eyes: Negative for visual disturbance (see optho q1y -- vision corrected to 20/20 with glasses).  Respiratory: Negative for cough, chest tightness and shortness of breath.   Cardiovascular: Negative for chest pain, palpitations and leg swelling.  Gastrointestinal: Negative for abdominal pain, diarrhea, constipation and abdominal distention.  Genitourinary: Negative for urgency, frequency, decreased urine volume and difficulty urinating.  Musculoskeletal: Negative for back pain, arthralgias and gait problem.  Skin: Negative for color change, pallor and rash.  Neurological: Negative for dizziness, light-headedness, numbness and headaches.  Hematological: Negative for adenopathy. Does not bruise/bleed easily.  Psychiatric/Behavioral: Negative for suicidal ideas, confusion, sleep disturbance, self-injury, dysphoric mood, decreased concentration and agitation.       Objective:    BP 120/74 (BP Location: Right Arm, Patient Position: Sitting, Cuff Size: Normal)   Pulse 77   Temp 97.8 F (36.6 C) (Oral)   Resp 18   Ht 5' (1.524 m)   Wt 166 lb 12.8 oz (75.7 kg)   SpO2 96%   BMI 32.58 kg/m  General appearance: alert, cooperative, appears stated age and no distress Head: Normocephalic, without obvious abnormality, atraumatic Eyes: negative findings: lids and lashes normal, conjunctivae and sclerae normal and pupils equal, round, reactive to light and accomodation Ears: normal TM's and external ear canals both ears Neck: no adenopathy, no carotid bruit, no JVD, supple, symmetrical, trachea midline and thyroid not enlarged, symmetric, no tenderness/mass/nodules Back: symmetric, no curvature. ROM normal. No CVA tenderness. Lungs: clear to auscultation bilaterally Breasts: normal appearance, no masses or tenderness Heart: regular rate and rhythm, S1, S2  normal, no murmur, click, rub or gallop Abdomen: soft, non-tender; bowel sounds normal; no masses,  no organomegaly Pelvic: deferred and not indicated; post-menopausal, no abnormal Pap smears in past Extremities: extremities normal, atraumatic, no cyanosis or edema Pulses: 2+ and symmetric Skin: Skin color, texture, turgor normal. No rashes or lesions Lymph nodes: Cervical, supraclavicular, and axillary nodes normal. Neurologic: Alert and oriented X 3, normal strength and tone. Normal symmetric reflexes. Normal coordination and gait    Assessment:    Healthy female exam.      Plan:     ghm utd Check labs  See After Visit Summary for Counseling Recommendations    1. Essential hypertension Well controlled, no changes to meds. Encouraged heart healthy diet such as the DASH diet and exercise as tolerated.  - amLODipine (NORVASC) 10 MG tablet; TAKE 1 TABLET(10 MG) BY MOUTH DAILY.  Dispense: 90 tablet; Refill: 3 - metoprolol succinate (TOPROL-XL) 25 MG 24 hr tablet; TAKE 1 TABLET(25 MG) BY MOUTH DAILY.  Dispense: 90 tablet; Refill: 3 - Lipid panel - Comprehensive metabolic panel - CBC with Differential/Platelet  2. Preventative health care See above Pt will wait for flu shot and covid booster until after the holidays

## 2020-09-23 NOTE — Patient Instructions (Signed)
Preventive Care 73 Years and Older, Female Preventive care refers to lifestyle choices and visits with your health care provider that can promote health and wellness. This includes:  A yearly physical exam. This is also called an annual well check.  Regular dental and eye exams.  Immunizations.  Screening for certain conditions.  Healthy lifestyle choices, such as diet and exercise. What can I expect for my preventive care visit? Physical exam Your health care provider will check:  Height and weight. These may be used to calculate body mass index (BMI), which is a measurement that tells if you are at a healthy weight.  Heart rate and blood pressure.  Your skin for abnormal spots. Counseling Your health care provider may ask you questions about:  Alcohol, tobacco, and drug use.  Emotional well-being.  Home and relationship well-being.  Sexual activity.  Eating habits.  History of falls.  Memory and ability to understand (cognition).  Work and work Statistician.  Pregnancy and menstrual history. What immunizations do I need?  Influenza (flu) vaccine  This is recommended every year. Tetanus, diphtheria, and pertussis (Tdap) vaccine  You may need a Td booster every 10 years. Varicella (chickenpox) vaccine  You may need this vaccine if you have not already been vaccinated. Zoster (shingles) vaccine  You may need this after age 73. Pneumococcal conjugate (PCV13) vaccine  One dose is recommended after age 73. Pneumococcal polysaccharide (PPSV23) vaccine  One dose is recommended after age 72. Measles, mumps, and rubella (MMR) vaccine  You may need at least one dose of MMR if you were born in 1957 or later. You may also need a second dose. Meningococcal conjugate (MenACWY) vaccine  You may need this if you have certain conditions. Hepatitis A vaccine  You may need this if you have certain conditions or if you travel or work in places where you may be exposed  to hepatitis A. Hepatitis B vaccine  You may need this if you have certain conditions or if you travel or work in places where you may be exposed to hepatitis B. Haemophilus influenzae type b (Hib) vaccine  You may need this if you have certain conditions. You may receive vaccines as individual doses or as more than one vaccine together in one shot (combination vaccines). Talk with your health care provider about the risks and benefits of combination vaccines. What tests do I need? Blood tests  Lipid and cholesterol levels. These may be checked every 5 years, or more frequently depending on your overall health.  Hepatitis C test.  Hepatitis B test. Screening  Lung cancer screening. You may have this screening every year starting at age 73 if you have a 30-pack-year history of smoking and currently smoke or have quit within the past 15 years.  Colorectal cancer screening. All adults should have this screening starting at age 73 and continuing until age 73. Your health care provider may recommend screening at age 73 if you are at increased risk. You will have tests every 1-10 years, depending on your results and the type of screening test.  Diabetes screening. This is done by checking your blood sugar (glucose) after you have not eaten for a while (fasting). You may have this done every 73 years.  Mammogram. This may be done every 73 years. Talk with your health care provider about how often you should have regular mammograms.  BRCA-related cancer screening. This may be done if you have a family history of breast, ovarian, tubal, or peritoneal cancers.  Other tests  Sexually transmitted disease (STD) testing.  Bone density scan. This is done to screen for osteoporosis. You may have this done starting at age 44. Follow these instructions at home: Eating and drinking  Eat a diet that includes fresh fruits and vegetables, whole grains, lean protein, and low-fat dairy products. Limit  your intake of foods with high amounts of sugar, saturated fats, and salt.  Take vitamin and mineral supplements as recommended by your health care provider.  Do not drink alcohol if your health care provider tells you not to drink.  If you drink alcohol: ? Limit how much you have to 0-1 drink a day. ? Be aware of how much alcohol is in your drink. In the [place] one drink equals one 12 oz bottle of beer (355 mL), one 5 oz glass of wine (148 mL), or one 1 oz glass of hard liquor (44 mL). Lifestyle  Take daily care of your teeth and gums.  Stay active. Exercise for at least 30 minutes on 5 or more days each week.  Do not use any products that contain nicotine or tobacco, such as cigarettes, e-cigarettes, and chewing tobacco. If you need help quitting, ask your health care provider.  If you are sexually active, practice safe sex. Use a condom or other form of protection in order to prevent STIs (sexually transmitted infections).  Talk with your health care provider about taking a low-dose aspirin or statin. What's next?  Go to your health care provider once a year for a well check visit.  Ask your health care provider how often you should have your eyes and teeth checked.  Stay up to date on all vaccines. This information is not intended to replace advice given to you by your health care provider. Make sure you discuss any questions you have with your health care provider. Document Revised: 09/18/2018 Document Reviewed: 09/18/2018 Elsevier Patient Education  2020 [employer].

## 2020-10-14 ENCOUNTER — Other Ambulatory Visit: Payer: Self-pay

## 2020-10-14 ENCOUNTER — Ambulatory Visit (HOSPITAL_COMMUNITY)
Admission: RE | Admit: 2020-10-14 | Discharge: 2020-10-14 | Disposition: A | Discharge status: Home / Self Care | Payer: Medicare Other | Source: Ambulatory Visit | Attending: Radiation Oncology | Admitting: Radiation Oncology

## 2020-10-14 ENCOUNTER — Encounter (HOSPITAL_COMMUNITY): Payer: Self-pay

## 2020-10-14 DIAGNOSIS — J432 Centrilobular emphysema: Secondary | ICD-10-CM | POA: Diagnosis not present

## 2020-10-14 DIAGNOSIS — R911 Solitary pulmonary nodule: Secondary | ICD-10-CM | POA: Diagnosis not present

## 2020-10-14 DIAGNOSIS — J984 Other disorders of lung: Secondary | ICD-10-CM | POA: Diagnosis not present

## 2020-10-14 DIAGNOSIS — C3490 Malignant neoplasm of unspecified part of unspecified bronchus or lung: Secondary | ICD-10-CM | POA: Diagnosis not present

## 2020-10-14 DIAGNOSIS — I700 Atherosclerosis of aorta: Secondary | ICD-10-CM | POA: Diagnosis not present

## 2020-10-14 HISTORY — DX: Malignant (primary) neoplasm, unspecified: C80.1

## 2020-10-17 ENCOUNTER — Ambulatory Visit
Admission: RE | Admit: 2020-10-17 | Discharge: 2020-10-17 | Disposition: A | Discharge status: Home / Self Care | Payer: Medicare Other | Source: Ambulatory Visit | Attending: Radiation Oncology | Admitting: Radiation Oncology

## 2020-10-17 ENCOUNTER — Telehealth: Payer: Self-pay | Admitting: *Deleted

## 2020-10-17 NOTE — Telephone Encounter (Signed)
CALLED PATIENT TO ASK ABOUT RESCHEDULING MISSED FU FOR 10-17-20, PATIENT AGREED TO COME ON 10-27-20 @ 10:45 AM

## 2020-10-17 NOTE — Progress Notes (Incomplete)
Radiation Oncology         [phone] ________________________________  Name: Pam Cameron MRN: [id number]  Date: 10/17/2020  DOB: 1946-01-06  Follow-Up Visit Note  CC: Ann Held, DO  [place], [place], *  No diagnosis found.  Diagnosis: Clinical stage IA1 (T1a, N0, M0) PET Positive Left lower pulmonary nodule     Interval Since Last Radiation: One year, eight months, three weeks, and six days  Radiation treatment dates:  4/6, 4/8, and 01/20/2019 (SBRT)  Site/dose:   Left Lung / 54 Gy in 3 fractions   Narrative:  The patient returns today for routine follow-up. She was last seen by Dr. Valeta Harms on 09/21/2020. It was felt that she could go to yearly scans.  She underwent a restaging chest CT scan on 10/14/2020, which showed ground-glass and further linear volume loss in the left lower lobe at the site of the treated lesion. There were no new nodules or suspicious findings.  On review of systems, she reports ***. She denies ***.                ALLERGIES:  is allergic to codeine.  Meds: Current Outpatient Medications  Medication Sig Dispense Refill  . amLODipine (NORVASC) 10 MG tablet TAKE 1 TABLET(10 MG) BY MOUTH DAILY. 90 tablet 3  . Cholecalciferol (VITAMIN D) 50 MCG (2000 UT) CAPS Take 1 capsule by mouth daily.    . flintstones complete (FLINTSTONES) 60 MG chewable tablet Chew 1 tablet by mouth daily.    . Fluticasone-Umeclidin-Vilant (TRELEGY ELLIPTA) 100-62.5-25 MCG/INH AEPB Inhale 1 puff into the lungs daily. 2 each 0  . metoprolol succinate (TOPROL-XL) 25 MG 24 hr tablet TAKE 1 TABLET(25 MG) BY MOUTH DAILY. 90 tablet 3  . multivitamin-lutein (OCUVITE-LUTEIN) CAPS capsule Take 1 capsule by mouth daily.    . TRELEGY ELLIPTA 100-62.5-25 MCG/INH AEPB Inhale 1 puff into the lungs daily. 90 each 4   No current facility-administered medications for this encounter.    Physical Findings: The patient is in no acute distress. Patient is alert and oriented.   vitals were not taken for this visit.  Lungs are clear to auscultation bilaterally except for some very mild wheezing.  heart has regular rate and rhythm. No palpable cervical, supraclavicular, or axillary adenopathy. Abdomen soft, non-tender, normal bowel sounds. ***  Lab Findings: Lab Results  Component Value Date   WBC 6.2 09/23/2020   HGB 15.2 (H) 09/23/2020   HCT 45.5 09/23/2020   MCV 97.4 09/23/2020   PLT 203.0 09/23/2020    Radiographic Findings: CT Chest Wo Contrast  Result Date: 10/14/2020 CLINICAL DATA:  Non-small cell lung cancer without reported metastatic disease, history of radiotherapy completed on January 20, 2019 by report in this 75 year old female EXAM: CT CHEST WITHOUT CONTRAST TECHNIQUE: Multidetector CT imaging of the chest was performed following the standard protocol without IV contrast. COMPARISON:  April 13, 2020 FINDINGS: Cardiovascular: Calcified atheromatous plaque in the thoracic aorta. No aneurysmal dilation. Heart size is normal without signs of pericardial effusion. Central pulmonary vasculature is normal caliber. Limited assessment of cardiovascular structures given lack of intravenous contrast. Mediastinum/Nodes: Thoracic inlet structures are normal. No axillary lymphadenopathy. No mediastinal lymphadenopathy. No gross hilar adenopathy. Lungs/Pleura: Marked pulmonary emphysema at the lung apices in particular with centrilobular predominance. Basilar atelectasis without consolidation or sign of effusion. Mild pleural and parenchymal scarring in the background. Ground-glass and further linear volume loss in the LEFT lower lobe at the site of treated lesion. Upper Abdomen:  Incidental imaging of upper abdominal contents shows cholelithiasis with collapsed gallbladder with similar appearance to prior imaging. Mild indistinct appearance of fat about the gallbladder is also similar and there is no gallbladder distension. Gallbladder is incompletely imaged. Musculoskeletal: Signs  of cement augmentation at multiple levels in the thoracic spine show a similar appearance to previous imaging CT. IMPRESSION: 1. Ground-glass and further linear volume loss in the LEFT lower lobe at the site of treated lesion. No signs of new nodule or suspicious finding. 2. Cholelithiasis with collapsed gallbladder and mild stranding in the fat about the gallbladder is unchanged over multiple prior studies and likely reflects changes related to prior inflammation. Correlate with any symptoms that would suggest chronic cholecystitis. 3. Emphysema and aortic atherosclerosis. Aortic Atherosclerosis (ICD10-I70.0) and Emphysema (ICD10-J43.9). Electronically Signed   By: Zetta Bills M.D.   On: 10/14/2020 16:17    Impression:  Clinical stage IA1 (T1a, N0, M0) PET Positive Left lower pulmonary nodule.    No clinical evidence of recurrence on exam today. Recent chest CT scan showed ground-glass and further linear volume loss in the left lower lobe at the site of the treated lesion. There were no new nodules or suspicious findings.  Plan: The patient will follow-up with radiation oncology in *** months.  Total time spent in this encounter was *** minutes which included reviewing the patient's most recent chest CT scan, follow-up with Dr. Valeta Harms, physical examination, and documentation.  ____________________________________   Blair Promise, PhD, MD  This document serves as a record of services personally performed by Gery Pray, MD. It was created on his behalf by Clerance Lav, a trained medical scribe. The creation of this record is based on the scribe's personal observations and the provider's statements to them. This document has been checked and approved by the attending provider.

## 2020-10-27 ENCOUNTER — Ambulatory Visit
Admission: RE | Admit: 2020-10-27 | Discharge: 2020-10-27 | Disposition: A | Discharge status: Home / Self Care | Payer: Medicare Other | Source: Ambulatory Visit | Attending: Radiation Oncology | Admitting: Radiation Oncology

## 2020-10-27 ENCOUNTER — Other Ambulatory Visit: Payer: Self-pay

## 2020-10-27 ENCOUNTER — Encounter: Payer: Self-pay | Admitting: Radiation Oncology

## 2020-10-27 DIAGNOSIS — Z85118 Personal history of other malignant neoplasm of bronchus and lung: Secondary | ICD-10-CM | POA: Insufficient documentation

## 2020-10-27 DIAGNOSIS — Z923 Personal history of irradiation: Secondary | ICD-10-CM | POA: Insufficient documentation

## 2020-10-27 DIAGNOSIS — J439 Emphysema, unspecified: Secondary | ICD-10-CM | POA: Insufficient documentation

## 2020-10-27 DIAGNOSIS — Z79899 Other long term (current) drug therapy: Secondary | ICD-10-CM | POA: Diagnosis not present

## 2020-10-27 DIAGNOSIS — Z8709 Personal history of other diseases of the respiratory system: Secondary | ICD-10-CM | POA: Diagnosis not present

## 2020-10-27 DIAGNOSIS — J449 Chronic obstructive pulmonary disease, unspecified: Secondary | ICD-10-CM | POA: Diagnosis not present

## 2020-10-27 DIAGNOSIS — R911 Solitary pulmonary nodule: Secondary | ICD-10-CM

## 2020-10-27 DIAGNOSIS — Z08 Encounter for follow-up examination after completed treatment for malignant neoplasm: Secondary | ICD-10-CM | POA: Diagnosis not present

## 2020-10-27 NOTE — Progress Notes (Signed)
Patient denies having any pain today.  She completed treatment to left lung in 2020.  Patient denies shortness of breath but does report coughing in the morning.  Reports she is still smoking less than a pack a day.  Denies any issues with swallowing.  Appetite is well.  Energy level is good.  Vitals:   10/27/20 1039  BP: (!) 142/63  Pulse: 70  Resp: 18  Temp: 97.7 F (36.5 C)  SpO2: 100%  Weight: 165 lb 3.2 oz (74.9 kg)  Height: 5' (1.524 m)

## 2020-10-27 NOTE — Progress Notes (Signed)
Radiation Oncology         [phone] ________________________________  Name: Pam Cameron MRN: [id number]  Date: 10/27/2020  DOB: August 10, 1946  Follow-Up Visit Note  CC: Ann Held, DO  Ann Held, *    ICD-10-CM   1. Nodule of lower lobe of left lung  R91.1     Diagnosis: Clinical stage IA1 (T1a, N0, M0) PET Positive Left lower pulmonary nodule     Interval Since Last Radiation: One year, nine months, and one week  Radiation treatment dates:  4/6, 4/8, and 01/20/2019 (SBRT)  Site/dose:   Left Lung / 54 Gy in 3 fractions   Narrative:  The patient returns today for routine follow-up. She was last seen by Dr. Valeta Harms on 09/21/2020. It was felt that she could go to yearly scans.  She underwent a restaging chest CT scan on 10/14/2020, which showed ground-glass and further linear volume loss in the left lower lobe at the site of the treated lesion. There were no new nodules or suspicious findings.  On review of systems, she reports her breathing is stable.  She denies any pain within the chest area, significant cough or hemoptysis.                 ALLERGIES:  is allergic to codeine.  Meds: Current Outpatient Medications  Medication Sig Dispense Refill  . amLODipine (NORVASC) 10 MG tablet TAKE 1 TABLET(10 MG) BY MOUTH DAILY. 90 tablet 3  . Cholecalciferol (VITAMIN D) 50 MCG (2000 UT) CAPS Take 1 capsule by mouth daily.    . flintstones complete (FLINTSTONES) 60 MG chewable tablet Chew 1 tablet by mouth daily.    . Fluticasone-Umeclidin-Vilant (TRELEGY ELLIPTA) 100-62.5-25 MCG/INH AEPB Inhale 1 puff into the lungs daily. 2 each 0  . metoprolol succinate (TOPROL-XL) 25 MG 24 hr tablet TAKE 1 TABLET(25 MG) BY MOUTH DAILY. 90 tablet 3  . multivitamin-lutein (OCUVITE-LUTEIN) CAPS capsule Take 1 capsule by mouth daily.    . TRELEGY ELLIPTA 100-62.5-25 MCG/INH AEPB Inhale 1 puff into the lungs daily. 90 each 4   No current facility-administered medications for  this encounter.    Physical Findings: The patient is in no acute distress. Patient is alert and oriented.  height is 5' (1.524 m) and weight is 165 lb 3.2 oz (74.9 kg). Her temperature is 97.7 F (36.5 C). Her blood pressure is 142/63 (abnormal) and her pulse is 70. Her respiration is 18 and oxygen saturation is 100%.  Lungs are clear to auscultation bilaterally except for some very mild wheezing.  heart has regular rate and rhythm. No palpable cervical, supraclavicular, or axillary adenopathy. Abdomen soft, non-tender, normal bowel sounds.   Lab Findings: Lab Results  Component Value Date   WBC 6.2 09/23/2020   HGB 15.2 (H) 09/23/2020   HCT 45.5 09/23/2020   MCV 97.4 09/23/2020   PLT 203.0 09/23/2020    Radiographic Findings: CT Chest Wo Contrast  Result Date: 10/14/2020 CLINICAL DATA:  Non-small cell lung cancer without reported metastatic disease, history of radiotherapy completed on January 20, 2019 by report in this 75 year old female EXAM: CT CHEST WITHOUT CONTRAST TECHNIQUE: Multidetector CT imaging of the chest was performed following the standard protocol without IV contrast. COMPARISON:  April 13, 2020 FINDINGS: Cardiovascular: Calcified atheromatous plaque in the thoracic aorta. No aneurysmal dilation. Heart size is normal without signs of pericardial effusion. Central pulmonary vasculature is normal caliber. Limited assessment of cardiovascular structures given lack of intravenous contrast. Mediastinum/Nodes: Thoracic inlet  structures are normal. No axillary lymphadenopathy. No mediastinal lymphadenopathy. No gross hilar adenopathy. Lungs/Pleura: Marked pulmonary emphysema at the lung apices in particular with centrilobular predominance. Basilar atelectasis without consolidation or sign of effusion. Mild pleural and parenchymal scarring in the background. Ground-glass and further linear volume loss in the LEFT lower lobe at the site of treated lesion. Upper Abdomen: Incidental imaging of  upper abdominal contents shows cholelithiasis with collapsed gallbladder with similar appearance to prior imaging. Mild indistinct appearance of fat about the gallbladder is also similar and there is no gallbladder distension. Gallbladder is incompletely imaged. Musculoskeletal: Signs of cement augmentation at multiple levels in the thoracic spine show a similar appearance to previous imaging CT. IMPRESSION: 1. Ground-glass and further linear volume loss in the LEFT lower lobe at the site of treated lesion. No signs of new nodule or suspicious finding. 2. Cholelithiasis with collapsed gallbladder and mild stranding in the fat about the gallbladder is unchanged over multiple prior studies and likely reflects changes related to prior inflammation. Correlate with any symptoms that would suggest chronic cholecystitis. 3. Emphysema and aortic atherosclerosis. Aortic Atherosclerosis (ICD10-I70.0) and Emphysema (ICD10-J43.9). Electronically Signed   By: Zetta Bills M.D.   On: 10/14/2020 16:17    Impression:  Clinical stage IA1 (T1a, N0, M0) PET Positive Left lower pulmonary nodule.    No clinical evidence of recurrence on exam today. Recent chest CT scan showed ground-glass and further linear volume loss in the left lower lobe at the site of the treated lesion. There were no new nodules or suspicious findings.  Plan: The patient will follow-up with radiation oncology on an as needed basis.  The patient will follow-up with Dr. Valeta Harms given her ongoing need for trelegy with her severe COPD.  She has been enrolled back in the yearly lung screening program and will have another CT scan of the chest in 1 year.  Total time spent in this encounter was 20 minutes which included reviewing the patient's most recent chest CT scan, follow-up with Dr. Valeta Harms, physical examination, and documentation.  ____________________________________   Blair Promise, PhD, MD  This document serves as a record of services personally  performed by Gery Pray, MD. It was created on his behalf by Clerance Lav, a trained medical scribe. The creation of this record is based on the scribe's personal observations and the provider's statements to them. This document has been checked and approved by the attending provider.

## 2020-11-14 ENCOUNTER — Other Ambulatory Visit: Payer: Self-pay | Admitting: *Deleted

## 2020-11-14 DIAGNOSIS — Z87891 Personal history of nicotine dependence: Secondary | ICD-10-CM

## 2020-11-14 DIAGNOSIS — F17210 Nicotine dependence, cigarettes, uncomplicated: Secondary | ICD-10-CM

## 2021-03-20 ENCOUNTER — Ambulatory Visit (HOSPITAL_COMMUNITY)
Admission: EM | Admit: 2021-03-20 | Discharge: 2021-03-20 | Disposition: A | Discharge status: Home / Self Care | Payer: Medicare Other

## 2021-03-20 ENCOUNTER — Encounter (HOSPITAL_COMMUNITY): Payer: Self-pay

## 2021-03-20 ENCOUNTER — Other Ambulatory Visit: Payer: Self-pay

## 2021-03-20 DIAGNOSIS — B370 Candidal stomatitis: Secondary | ICD-10-CM | POA: Diagnosis not present

## 2021-03-20 DIAGNOSIS — B3781 Candidal esophagitis: Secondary | ICD-10-CM

## 2021-03-20 MED ORDER — MAGIC MOUTHWASH W/LIDOCAINE: 5.0000 mL | Freq: Four times a day (QID) | ORAL | 0 refills | Status: DC

## 2021-03-20 MED ORDER — FLUCONAZOLE 150 MG PO TABS: 150.0000 mg | ORAL_TABLET | ORAL | 0 refills | Status: AC

## 2021-03-20 NOTE — ED Provider Notes (Signed)
[facility]  ____________________________________________  Time seen: Approximately 9:45 AM  I have reviewed the triage vital signs and the nursing notes.   HISTORY  Chief Complaint Sore Throat    HPI Pam Cameron is a 75 y.o. female who presented to the emergency department complaining of sore throat x1 week.  She states that it is primarily painful when she is swallowing.  She has had no voice changes, no difficulty breathing.  She has no difficulty swallowing or having pain.  Patient has tried over-the-counter medications with no relief.  She states that she will have a sensation that things are getting stuck as it is passing through the oropharynx but has no difficulty swallowing with states that her esophagus.       Past Medical History:  Diagnosis Date   Anemia    Blood transfusion    Cataracts, bilateral    CHF (congestive heart failure) (HCC)    COPD (chronic obstructive pulmonary disease) ([place])    Depression    Hypertension    left lung ca dx'd 08/2018   SBRT comp 01/20/19   Nodule of lower lobe of left lung 12/10/2018   Pneumonia     Patient Active Problem List   Diagnosis Date Noted   History of anemia 03/21/2020   Nodule of lower lobe of left lung 12/10/2018   Lower extremity edema 06/20/2017   Thoracic compression fracture ([place]) 08/10/2015   Acute upper respiratory infection 08/31/2014   Hyponatremia 05/07/2013   COPD (chronic obstructive pulmonary disease) ([place]) 04/15/2013   Osteoporosis, postmenopausal 03/24/2012   Tobacco abuse 03/24/2012   Abnormal CT of the abdomen 10/10/2011   Female pattern hair loss 04/25/2011   URI 11/28/2009   UTI 11/28/2009   Essential hypertension 10/11/2008   BACK PAIN 10/11/2008    Past Surgical History:  Procedure Laterality Date   APPENDECTOMY     ESOPHAGOGASTRODUODENOSCOPY ENDOSCOPY     FIXATION KYPHOPLASTY LUMBAR SPINE  12/14/2011   KYPHOPLASTY N/A 08/10/2015   Procedure: Thoracic four and thoracic  five kyphoplasty;  Surgeon: Kevan Ny Ditty, MD;  Location: [facility];  Service: Neurosurgery;  Laterality: N/A;  T4 and T5 kyphoplasties   LUNG SURGERY     fluid removed after pneumonia   TONSILLECTOMY     TUBAL LIGATION      Prior to Admission medications   Medication Sig Start Date End Date Taking? Authorizing Provider  amLODipine (NORVASC) 10 MG tablet TAKE 1 TABLET(10 MG) BY MOUTH DAILY. 09/23/20  Yes Ann Held, DO  Cholecalciferol (VITAMIN D) 50 MCG (2000 UT) CAPS Take 1 capsule by mouth daily.   Yes [provider]  flintstones complete (FLINTSTONES) 60 MG chewable tablet Chew 1 tablet by mouth daily.   Yes [provider]  fluconazole (DIFLUCAN) 150 MG tablet Take 1 tablet (150 mg total) by mouth once a week for 2 doses. 03/20/21 03/28/21 Yes Lott Seelbach, Charline Bills, PA-C  Fluticasone-Umeclidin-Vilant (TRELEGY ELLIPTA) 100-62.5-25 MCG/INH AEPB Inhale 1 puff into the lungs daily. 09/21/20  Yes Icard, Leory Plowman L, DO  magic mouthwash w/lidocaine SOLN Take 5 mLs by mouth 4 (four) times daily. Swish, gargle, spit out. 03/20/21  Yes Atasha Colebank, Charline Bills, PA-C  metoprolol succinate (TOPROL-XL) 25 MG 24 hr tablet TAKE 1 TABLET(25 MG) BY MOUTH DAILY. 09/23/20  Yes [name] R, DO  multivitamin-lutein (OCUVITE-LUTEIN) CAPS capsule Take 1 capsule by mouth daily.   Yes [provider]  UNABLE TO FIND Med Name: pt reports taking  calcium supplement, unsure of dose   Yes [provider]    Allergies Codeine  Family History  Problem Relation Age of Onset   Ovarian cancer Mother    Hypertension Mother    Cancer Mother        uterine   Prostate cancer Father    Hypertension Father    Cancer Father        lung   Dementia Father    Diabetes Sister     Social History Social History   Tobacco Use   Smoking status: Every Day    Packs/day: 0.75    Years: 52.00    Pack years: 39.00    Types: Cigarettes    Start date: 12/09/1968    Smokeless tobacco: Never   Tobacco comments:    cutting down   Substance Use Topics   Alcohol use: Yes    Alcohol/week: 14.0 standard drinks    Types: 14 Standard drinks or equivalent per week    Comment: approx one drink per day   Drug use: No     Review of Systems  Constitutional: No fever/chills Eyes: No visual changes. No discharge ENT: Positive for sore throat Cardiovascular: no chest pain. Respiratory: no cough. No SOB. Gastrointestinal: No abdominal pain.  No nausea, no vomiting.  No diarrhea.  No constipation. Musculoskeletal: Negative for musculoskeletal pain. Skin: Negative for rash, abrasions, lacerations, ecchymosis. Neurological: Negative for headaches, focal weakness or numbness.  10 System ROS otherwise negative.  ____________________________________________   PHYSICAL EXAM:  VITAL SIGNS: ED Triage Vitals  Enc Vitals Group     BP 03/20/21 0907 131/81     Pulse Rate 03/20/21 0907 77     Resp 03/20/21 0907 18     Temp 03/20/21 0907 98 F (36.7 C)     Temp src --      SpO2 03/20/21 0907 97 %     Weight --      Height --      Head Circumference --      Peak Flow --      Pain Score 03/20/21 0902 7     Pain Loc --      Pain Edu? --      Excl. in [place]? --      Constitutional: Alert and oriented. Well appearing and in no acute distress. Eyes: Conjunctivae are normal. PERRL. EOMI. Head: Atraumatic. ENT:      Ears:       Nose: No congestion/rhinnorhea.      Mouth/Throat: Mucous membranes are moist.  Oropharynx is visualized.  On the posterior tongue there is lingular tonsillitis as well as white plaque consistent with thrush.  Tonsils are mildly erythematous but nonedematous and no exudates.  Uvula is midline. Neck: No stridor.  Hematological/Lymphatic/Immunilogical: No cervical lymphadenopathy Cardiovascular: Normal rate, regular rhythm. Normal S1 and S2.  Good peripheral circulation. Respiratory: Normal respiratory effort without tachypnea or  retractions. Lungs CTAB. Good air entry to the bases with no decreased or absent breath sounds. Musculoskeletal: Full range of motion to all extremities. No gross deformities appreciated. Neurologic:  Normal speech and language. No gross focal neurologic deficits are appreciated.  Skin:  Skin is warm, dry and intact. No rash noted. Psychiatric: Mood and affect are normal. Speech and behavior are normal. Patient exhibits appropriate insight and judgement.   ____________________________________________   LABS (all labs ordered are listed, but only abnormal results are displayed)  Labs Reviewed - No data to display ____________________________________________  EKG  ____________________________________________  RADIOLOGY   No results found.  ____________________________________________    PROCEDURES  Procedure(s) performed:    Procedures    Medications - No data to display   ____________________________________________   INITIAL IMPRESSION / ASSESSMENT AND PLAN / ED COURSE  Pertinent labs & imaging results that were available during my care of the patient were reviewed by me and considered in my medical decision making (see chart for details).  Review of the [place] CSRS was performed in accordance of the [place] prior to dispensing any controlled drugs.           Patient's diagnosis is consistent with thrush.  Patient presented to the emergency department complaining of worsening sore throat x1 week.  No fevers or chills.  No other URI symptoms.  Patient states that it feels difficult to swallow as things feel like they are "getting stuck" in her oropharynx region but once it is in her esophagus she has no difficulty swallowing.  Visualization revealed findings consistent with oral thrush.  Patient will have prescription for Diflucan and Magic mouthwash with nystatin.  Return precautions discussed with the patient.  Follow-up primary care as needed. Patient is given ED  precautions to return to the ED for any worsening or new symptoms.     ____________________________________________  FINAL CLINICAL IMPRESSION(S) / DIAGNOSES  Final diagnoses:  Thrush of mouth and esophagus ([place])      NEW MEDICATIONS STARTED DURING THIS VISIT:  ED Discharge Orders          Ordered    fluconazole (DIFLUCAN) 150 MG tablet  Weekly        03/20/21 0948    magic mouthwash w/lidocaine SOLN  4 times daily       Note to Pharmacy: Dispense in a 1/1/1/1 ratio. Use lidocaine, diphenhydramine, prednisolone, nystatin   03/20/21 0948                This chart was dictated using voice recognition software/Dragon. Despite best efforts to proofread, errors can occur which can change the meaning. Any change was purely unintentional.    Darletta Moll, PA-C 03/20/21 [phone]

## 2021-03-20 NOTE — ED Triage Notes (Addendum)
Pt c/o sore throat for about one week. Reports pain is only when swallowing and that makes it so she is unable to "get stuff down".

## 2021-07-12 DIAGNOSIS — H524 Presbyopia: Secondary | ICD-10-CM | POA: Diagnosis not present

## 2021-07-18 NOTE — Progress Notes (Addendum)
Subjective:   Pam Cameron is a 75 y.o. female who presents for Medicare Annual (Subsequent) preventive examination.  I connected with Hol[place]ace today by telephone and verified that I am speaking with the correct person using two identifiers. Location patient: home Location provider: work Persons participating in the virtual visit: patient, Marine scientist.    I discussed the limitations, risks, security and privacy concerns of performing an evaluation and management service by telephone and the availability of in person appointments. I also discussed with the patient that there may be a patient responsible charge related to this service. The patient expressed understanding and verbally consented to this telephonic visit.    Interactive audio and video telecommunications were attempted between this provider and patient, however failed, due to patient having technical difficulties OR patient did not have access to video capability.  We continued and completed visit with audio only.  Some vital signs may be absent or patient reported.   Time Spent with patient on telephone encounter: 20 minutes   Review of Systems     Cardiac Risk Factors include: advanced age (>71men, >6 women);sedentary lifestyle;smoking/ tobacco exposure;obesity (BMI >30kg/m2);hypertension     Objective:    Today's Vitals   07/19/21 0820  Weight: 165 lb (74.8 kg)  Height: 5' (1.524 m)   Body mass index is 32.22 kg/m.  Advanced Directives 07/19/2021 10/27/2020 05/27/2020 04/14/2020 10/15/2019 05/04/2019 12/17/2018  Does Patient Have a Medical Advance Directive? No No No No No No No  Would patient like information on creating a medical advance directive? Yes (MAU/Ambulatory/Procedural Areas - Information given) No - Patient declined No - Patient declined No - Patient declined - No - Patient declined -    Current Medications (verified) Outpatient Encounter Medications as of 07/19/2021  Medication Sig   amLODipine  (NORVASC) 10 MG tablet TAKE 1 TABLET(10 MG) BY MOUTH DAILY.   Cholecalciferol (VITAMIN D) 50 MCG (2000 UT) CAPS Take 1 capsule by mouth daily.   flintstones complete (FLINTSTONES) 60 MG chewable tablet Chew 1 tablet by mouth daily.   Fluticasone-Umeclidin-Vilant (TRELEGY ELLIPTA) 100-62.5-25 MCG/INH AEPB Inhale 1 puff into the lungs daily.   magic mouthwash w/lidocaine SOLN Take 5 mLs by mouth 4 (four) times daily. Swish, gargle, spit out.   metoprolol succinate (TOPROL-XL) 25 MG 24 hr tablet TAKE 1 TABLET(25 MG) BY MOUTH DAILY.   multivitamin-lutein (OCUVITE-LUTEIN) CAPS capsule Take 1 capsule by mouth daily.   UNABLE TO FIND Med Name: pt reports taking calcium supplement, unsure of dose   No facility-administered encounter medications on file as of 07/19/2021.    Allergies (verified) Codeine   History: Past Medical History:  Diagnosis Date   Anemia    Blood transfusion    Cataracts, bilateral    CHF (congestive heart failure) (HCC)    COPD (chronic obstructive pulmonary disease) ([place])    Depression    Hypertension    left lung ca dx'd 08/2018   SBRT comp 01/20/19   Nodule of lower lobe of left lung 12/10/2018   Pneumonia    Past Surgical History:  Procedure Laterality Date   APPENDECTOMY     ESOPHAGOGASTRODUODENOSCOPY ENDOSCOPY     FIXATION KYPHOPLASTY LUMBAR SPINE  12/14/2011   KYPHOPLASTY N/A 08/10/2015   Procedure: Thoracic four and thoracic five kyphoplasty;  Surgeon: Kevan Ny Ditty, MD;  Location: [facility];  Service: Neurosurgery;  Laterality: N/A;  T4 and T5 kyphoplasties   LUNG SURGERY     fluid removed after pneumonia   TONSILLECTOMY  TUBAL LIGATION     Family History  Problem Relation Age of Onset   Ovarian cancer Mother    Hypertension Mother    Cancer Mother        uterine   Prostate cancer Father    Hypertension Father    Cancer Father        lung   Dementia Father    Diabetes Sister    Social History   Socioeconomic History   Marital  status: Single    Spouse name: Not on file   Number of children: 0   Years of education: Not on file   Highest education level: Not on file  Occupational History   Occupation: Scientist, clinical (histocompatibility and immunogenetics): [employer]  Tobacco Use   Smoking status: Every Day    Packs/day: 0.75    Years: 52.00    Pack years: 39.00    Types: Cigarettes    Start date: 12/09/1968   Smokeless tobacco: Never   Tobacco comments:    cutting down   Substance and Sexual Activity   Alcohol use: Yes    Alcohol/week: 14.0 standard drinks    Types: 14 Standard drinks or equivalent per week    Comment: approx one drink per day   Drug use: No   Sexual activity: Yes    Partners: Male  Other Topics Concern   Not on file  Social History Narrative   Exercise--no   Social Determinants of Health   Financial Resource Strain: Low Risk    Difficulty of Paying Living Expenses: Not hard at all  Food Insecurity: No Food Insecurity   Worried About Charity fundraiser in the Last Year: Never true   Arboriculturist in the Last Year: Never true  Transportation Needs: No Transportation Needs   Lack of Transportation (Medical): No   Lack of Transportation (Non-Medical): No  Physical Activity: Inactive   Days of Exercise per Week: 0 days   Minutes of Exercise per Session: 0 min  Stress: No Stress Concern Present   Feeling of Stress : Not at all  Social Connections: Moderately Isolated   Frequency of Communication with Friends and Family: More than three times a week   Frequency of Social Gatherings with Friends and Family: More than three times a week   Attends Religious Services: More than 4 times per year   Active Member of [employer] or Organizations: No   Attends Music therapist: Never   Marital Status: Divorced    Tobacco Counseling Ready to quit: Not Answered Counseling given: Not Answered Tobacco comments: cutting down    Clinical Intake:  Pre-visit preparation completed: Yes  Pain : No/denies  pain     BMI - recorded: 32.22 Nutritional Status: BMI > 30  Obese Nutritional Risks: None Diabetes: No  How often do you need to have someone help you when you read instructions, pamphlets, or other written materials from your doctor or pharmacy?: 1 - Never  Diabetic?No  Interpreter Needed?: No  Information entered by :: Caroleen Hamman LPN   Activities of Daily Living In your present state of health, do you have any difficulty performing the following activities: 07/19/2021  Hearing? N  Vision? N  Difficulty concentrating or making decisions? N  Walking or climbing stairs? N  Dressing or bathing? N  Doing errands, shopping? N  Preparing Food and eating ? N  Using the Toilet? N  In the past six months, have you accidently leaked urine? N  Do you have problems with loss of bowel control? N  Managing your Medications? N  Managing your Finances? N  Housekeeping or managing your Housekeeping? N  Some recent data might be hidden    Patient Care Team: Carollee Herter, Alferd Apa, DO as PCP - General Icard, Octavio Graves, DO as Consulting Physician (Pulmonary Disease) Gery Pray, MD as Consulting Physician (Radiation Oncology)  Indicate any recent Medical Services you may have received from other than [facility] providers in the past year (date may be approximate).     Assessment:   This is a routine wellness examination for Pam Cameron.  Hearing/Vision screen Hearing Screening - Comments:: No issues Vision Screening - Comments:: Last eye exam-06/2021-Dr. Tanner  Dietary issues and exercise activities discussed: Current Exercise Habits: The patient does not participate in regular exercise at present, Exercise limited by: None identified   Goals Addressed             This Visit's Progress    DIET - INCREASE WATER INTAKE   On track      Depression Screen PHQ 2/9 Scores 07/19/2021 05/27/2020 05/31/2016 12/07/2014 04/15/2013 03/24/2012  PHQ - 2 Score 0 0 0 0 0 0    Fall Risk Fall  Risk  07/19/2021 05/27/2020 05/31/2016 12/07/2014 04/15/2013  Falls in the past year? 0 0 No No No  Number falls in past yr: 0 0 - - -  Injury with Fall? 0 0 - - -  Follow up Falls prevention discussed Education provided;Falls prevention discussed - - -    FALL RISK PREVENTION PERTAINING TO THE HOME:  Any stairs in or around the home? Yes  If so, are there any without handrails? No  Home free of loose throw rugs in walkways, pet beds, electrical cords, etc? Yes  Adequate lighting in your home to reduce risk of falls? Yes   ASSISTIVE DEVICES UTILIZED TO PREVENT FALLS:  Life alert? No  Use of a cane, walker or w/c? No  Grab bars in the bathroom? Yes  Shower chair or bench in shower? No  Elevated toilet seat or a handicapped toilet? No   TIMED UP AND GO:  Was the test performed? No . Phone visit   Cognitive Function:Normal cognitive status assessed by this Nurse Health Advisor. No abnormalities found.   MMSE - Mini Mental State Exam 05/31/2016  Orientation to time 5  Orientation to Place 5  Registration 3  Attention/ Calculation 5  Recall 3  Language- name 2 objects 2  Language- repeat 1  Language- follow 3 step command 3  Language- read & follow direction 1  Write a sentence 1  Copy design 1  Total score 30        Immunizations Immunization History  Administered Date(s) Administered   Fluad Quad(high Dose 65+) 07/01/2019   PFIZER(Purple Top)SARS-COV-2 Vaccination 11/21/2019, 12/16/2019   Pneumococcal Conjugate-13 06/30/2015   Pneumococcal Polysaccharide-23 03/24/2012, 06/20/2017   Tdap 10/10/2011   Zoster, Live 03/24/2012    TDAP status: Up to date  Flu Vaccine status: Due, Education has been provided regarding the importance of this vaccine. Advised may receive this vaccine at local pharmacy or Health Dept. Aware to provide a copy of the vaccination record if obtained from local pharmacy or Health Dept. Verbalized acceptance and understanding.  Pneumococcal  vaccine status: Up to date  Covid-19 vaccine status: Information provided on how to obtain vaccines.   Qualifies for Shingles Vaccine? Yes   Zostavax completed Yes   Shingrix Completed?: No.  Education has been provided regarding the importance of this vaccine. Patient has been advised to call insurance company to determine out of pocket expense if they have not yet received this vaccine. Advised may also receive vaccine at local pharmacy or Health Dept. Verbalized acceptance and understanding.  Screening Tests Health Maintenance  Topic Date Due   Zoster Vaccines- Shingrix (1 of 2) Never done   COVID-19 Vaccine (3 - Pfizer risk series) 01/13/2020   INFLUENZA VACCINE  05/08/2021   MAMMOGRAM  07/13/2021   COLONOSCOPY (Pts 45-43yrs Insurance coverage will need to be confirmed)  10/09/2021   TETANUS/TDAP  10/09/2021   DEXA SCAN  07/13/2022   Hepatitis C Screening  Completed   HPV VACCINES  Aged Out    Health Maintenance  Health Maintenance Due  Topic Date Due   Zoster Vaccines- Shingrix (1 of 2) Never done   COVID-19 Vaccine (3 - Pfizer risk series) 01/13/2020   INFLUENZA VACCINE  05/08/2021   MAMMOGRAM  07/13/2021    Colorectal cancer screening: Declined today  Mammogram status: Scheduled for 07/26/2021  Bone Density status: Completed 07/13/2020. Results reflect: Bone density results: OSTEOPOROSIS. Repeat every 2 years.  Lung Cancer Screening: Completed 10/14/2020  Additional Screening:  Hepatitis C Screening: Completed 06/30/2015  Vision Screening: Recommended annual ophthalmology exams for early detection of glaucoma and other disorders of the eye. Is the patient up to date with their annual eye exam?  Yes  Who is the provider or what is the name of the office in which the patient attends annual eye exams? Dr. Satira Sark   Dental Screening: Recommended annual dental exams for proper oral hygiene  Community Resource Referral / Chronic Care Management: CRR required this  visit?  No   CCM required this visit?  No      Plan:     I have personally reviewed and noted the following in the patient's chart:   Medical and social history Use of alcohol, tobacco or illicit drugs  Current medications and supplements including opioid prescriptions.  Functional ability and status Nutritional status Physical activity Advanced directives List of other physicians Hospitalizations, surgeries, and ER visits in previous 12 months Vitals Screenings to include cognitive, depression, and falls Referrals and appointments  In addition, I have reviewed and discussed with patient certain preventive protocols, quality metrics, and best practice recommendations. A written personalized care plan for preventive services as well as general preventive health recommendations were provided to patient.   Due to this being a telephonic visit, the after visit summary with patients personalized plan was offered to patient via mail or my-chart.  Patient preferred to pick up at office at next visit.    Marta Antu, LPN   [id number]  Nurse Health Advisor  Nurse Notes: None   I have reviewed and agree with Health Coaches documentation.  Kathlene November, MD

## 2021-07-19 ENCOUNTER — Ambulatory Visit (INDEPENDENT_AMBULATORY_CARE_PROVIDER_SITE_OTHER): Payer: Medicare Other

## 2021-07-19 VITALS — Ht 60.0 in | Wt 165.0 lb

## 2021-07-19 DIAGNOSIS — Z0000 Encounter for general adult medical examination without abnormal findings: Secondary | ICD-10-CM | POA: Diagnosis not present

## 2021-07-19 NOTE — Patient Instructions (Signed)
Pam Cameron , Thank you for taking time to complete your Medicare Wellness Visit. I appreciate your ongoing commitment to your health goals. Please review the following plan we discussed and let me know if I can assist you in the future.   Screening recommendations/referrals: Colonoscopy: Declined Mammogram: Scheduled for next week. Bone Density: Completed 07/13/2020-Due 07/13/2022 Recommended yearly ophthalmology/optometry visit for glaucoma screening and checkup Recommended yearly dental visit for hygiene and checkup  Vaccinations: Influenza vaccine: Due-May obtain vaccine at our office or your local pharmacy. Pneumococcal vaccine: Up to date Tdap vaccine: Up to date-Due-10/09/2021 Shingles vaccine: Discuss with pharmacy   Covid-19:Booster Available at the pharmacy.  Advanced directives: Information mailed today  Conditions/risks identified: See problem list  Next appointment: Follow up in one year for your annual wellness visit 07/24/2022 @ 8:20   Preventive Care 65 Years and Older, Female Preventive care refers to lifestyle choices and visits with your health care provider that can promote health and wellness. What does preventive care include? A yearly physical exam. This is also called an annual well check. Dental exams once or twice a year. Routine eye exams. Ask your health care provider how often you should have your eyes checked. Personal lifestyle choices, including: Daily care of your teeth and gums. Regular physical activity. Eating a healthy diet. Avoiding tobacco and drug use. Limiting alcohol use. Practicing safe sex. Taking low-dose aspirin every day. Taking vitamin and mineral supplements as recommended by your health care provider. What happens during an annual well check? The services and screenings done by your health care provider during your annual well check will depend on your age, overall health, lifestyle risk factors, and family history of  disease. Counseling  Your health care provider may ask you questions about your: Alcohol use. Tobacco use. Drug use. Emotional well-being. Home and relationship well-being. Sexual activity. Eating habits. History of falls. Memory and ability to understand (cognition). Work and work Statistician. Reproductive health. Screening  You may have the following tests or measurements: Height, weight, and BMI. Blood pressure. Lipid and cholesterol levels. These may be checked every 5 years, or more frequently if you are over 27 years old. Skin check. Lung cancer screening. You may have this screening every year starting at age 38 if you have a 30-pack-year history of smoking and currently smoke or have quit within the past 15 years. Fecal occult blood test (FOBT) of the stool. You may have this test every year starting at age 20. Flexible sigmoidoscopy or colonoscopy. You may have a sigmoidoscopy every 5 years or a colonoscopy every 10 years starting at age 61. Hepatitis C blood test. Hepatitis B blood test. Sexually transmitted disease (STD) testing. Diabetes screening. This is done by checking your blood sugar (glucose) after you have not eaten for a while (fasting). You may have this done every 1-3 years. Bone density scan. This is done to screen for osteoporosis. You may have this done starting at age 20. Mammogram. This may be done every 1-2 years. Talk to your health care provider about how often you should have regular mammograms. Talk with your health care provider about your test results, treatment options, and if necessary, the need for more tests. Vaccines  Your health care provider may recommend certain vaccines, such as: Influenza vaccine. This is recommended every year. Tetanus, diphtheria, and acellular pertussis (Tdap, Td) vaccine. You may need a Td booster every 10 years. Zoster vaccine. You may need this after age 22. Pneumococcal 13-valent conjugate (PCV13) vaccine. One  dose is recommended after age 59. Pneumococcal polysaccharide (PPSV23) vaccine. One dose is recommended after age 1. Talk to your health care provider about which screenings and vaccines you need and how often you need them. This information is not intended to replace advice given to you by your health care provider. Make sure you discuss any questions you have with your health care provider. Document Released: 10/21/2015 Document Revised: 06/13/2016 Document Reviewed: 07/26/2015 Elsevier Interactive Patient Education  2017 [place] Prevention in the Home Falls can cause injuries. They can happen to people of all ages. There are many things you can do to make your home safe and to help prevent falls. What can I do on the outside of my home? Regularly fix the edges of walkways and driveways and fix any cracks. Remove anything that might make you trip as you walk through a door, such as a raised step or threshold. Trim any bushes or trees on the path to your home. Use bright outdoor lighting. Clear any walking paths of anything that might make someone trip, such as rocks or tools. Regularly check to see if handrails are loose or broken. Make sure that both sides of any steps have handrails. Any raised decks and porches should have guardrails on the edges. Have any leaves, snow, or ice cleared regularly. Use sand or salt on walking paths during winter. Clean up any spills in your garage right away. This includes oil or grease spills. What can I do in the bathroom? Use night lights. Install grab bars by the toilet and in the tub and shower. Do not use towel bars as grab bars. Use non-skid mats or decals in the tub or shower. If you need to sit down in the shower, use a plastic, non-slip stool. Keep the floor dry. Clean up any water that spills on the floor as soon as it happens. Remove soap buildup in the tub or shower regularly. Attach bath mats securely with double-sided  non-slip rug tape. Do not have throw rugs and other things on the floor that can make you trip. What can I do in the bedroom? Use night lights. Make sure that you have a light by your bed that is easy to reach. Do not use any sheets or blankets that are too big for your bed. They should not hang down onto the floor. Have a firm chair that has side arms. You can use this for support while you get dressed. Do not have throw rugs and other things on the floor that can make you trip. What can I do in the kitchen? Clean up any spills right away. Avoid walking on wet floors. Keep items that you use a lot in easy-to-reach places. If you need to reach something above you, use a strong step stool that has a grab bar. Keep electrical cords out of the way. Do not use floor polish or wax that makes floors slippery. If you must use wax, use non-skid floor wax. Do not have throw rugs and other things on the floor that can make you trip. What can I do with my stairs? Do not leave any items on the stairs. Make sure that there are handrails on both sides of the stairs and use them. Fix handrails that are broken or loose. Make sure that handrails are as long as the stairways. Check any carpeting to make sure that it is firmly attached to the stairs. Fix any carpet that is loose or worn. Avoid  having throw rugs at the top or bottom of the stairs. If you do have throw rugs, attach them to the floor with carpet tape. Make sure that you have a light switch at the top of the stairs and the bottom of the stairs. If you do not have them, ask someone to add them for you. What else can I do to help prevent falls? Wear shoes that: Do not have high heels. Have rubber bottoms. Are comfortable and fit you well. Are closed at the toe. Do not wear sandals. If you use a stepladder: Make sure that it is fully opened. Do not climb a closed stepladder. Make sure that both sides of the stepladder are locked into place. Ask  someone to hold it for you, if possible. Clearly mark and make sure that you can see: Any grab bars or handrails. First and last steps. Where the edge of each step is. Use tools that help you move around (mobility aids) if they are needed. These include: Canes. Walkers. Scooters. Crutches. Turn on the lights when you go into a dark area. Replace any light bulbs as soon as they burn out. Set up your furniture so you have a clear path. Avoid moving your furniture around. If any of your floors are uneven, fix them. If there are any pets around you, be aware of where they are. Review your medicines with your doctor. Some medicines can make you feel dizzy. This can increase your chance of falling. Ask your doctor what other things that you can do to help prevent falls. This information is not intended to replace advice given to you by your health care provider. Make sure you discuss any questions you have with your health care provider. Document Released: 07/21/2009 Document Revised: 03/01/2016 Document Reviewed: 10/29/2014 Elsevier Interactive Patient Education  2017 [employer].

## 2021-07-25 DIAGNOSIS — Z1231 Encounter for screening mammogram for malignant neoplasm of breast: Secondary | ICD-10-CM | POA: Diagnosis not present

## 2021-07-25 LAB — HM MAMMOGRAPHY

## 2021-09-17 ENCOUNTER — Other Ambulatory Visit: Payer: Self-pay | Admitting: Family Medicine

## 2021-09-17 DIAGNOSIS — I10 Essential (primary) hypertension: Secondary | ICD-10-CM

## 2021-09-20 ENCOUNTER — Other Ambulatory Visit (HOSPITAL_BASED_OUTPATIENT_CLINIC_OR_DEPARTMENT_OTHER): Payer: Self-pay

## 2021-09-20 ENCOUNTER — Ambulatory Visit (INDEPENDENT_AMBULATORY_CARE_PROVIDER_SITE_OTHER): Payer: Medicare Other | Admitting: Family

## 2021-09-20 VITALS — BP 153/74 | HR 84 | Temp 98.1°F | Resp 16 | Ht 60.0 in | Wt 164.0 lb

## 2021-09-20 DIAGNOSIS — J019 Acute sinusitis, unspecified: Secondary | ICD-10-CM | POA: Diagnosis not present

## 2021-09-20 MED ORDER — BENZONATATE 100 MG PO CAPS
100.0000 mg | ORAL_CAPSULE | Freq: Three times a day (TID) | ORAL | 0 refills | Status: DC | PRN
  Filled 2021-09-20: qty 20, 7d supply, fill #0

## 2021-09-20 MED ORDER — AMOXICILLIN-POT CLAVULANATE 875-125 MG PO TABS
1.0000 | ORAL_TABLET | Freq: Two times a day (BID) | ORAL | 0 refills | Status: DC
  Filled 2021-09-20: qty 20, 10d supply, fill #0

## 2021-09-20 NOTE — Patient Instructions (Signed)
Please start augmentin for sinus infection. You may use tessalon as needed for cough.

## 2021-09-20 NOTE — Progress Notes (Signed)
Subjective:   By signing my name below, I, Shehryar Baig, attest that this documentation has been prepared under the direction and in the presence of Debbrah Alar NP. 09/20/2021    Patient ID: Pam Cameron, female    DOB: 06/13/1946, 75 y.o.   MRN: [id number]  Chief Complaint  Patient presents with   Sinus Problem    Complains of Sinus Problem    Cough    Complains of  Dry Coughing onset 1 Week     Sinus Problem Associated symptoms include congestion and coughing. Pertinent negatives include no sore throat.  Cough Pertinent negatives include no fever or sore throat.  Patient is in today for a office visit.  She complains of congestion, rhinorrhea, dry cough for the past week. She denies having any fever, sore throat, or fatigue at this time. She also has sinus pressure. She tested negative for Covid-19 on 09/18/2021. She thinks she may have a sinus infection. She is currently taking alka seltzer cold to manage her symptoms and finds mild relief. She is interested in taking cough medication to manage her symptoms.  Immunizations- She is interested in receiving the bivalent Covid-19 vaccine and the flu vaccine at her yearly physical exam visit next week.    Health Maintenance Due  Topic Date Due   Zoster Vaccines- Shingrix (1 of 2) Never done   COVID-19 Vaccine (3 - Pfizer risk series) 01/13/2020   INFLUENZA VACCINE  05/08/2021    Past Medical History:  Diagnosis Date   Anemia    Blood transfusion    Cataracts, bilateral    CHF (congestive heart failure) (HCC)    COPD (chronic obstructive pulmonary disease) ([place])    Depression    Hypertension    left lung ca dx'd 08/2018   SBRT comp 01/20/19   Nodule of lower lobe of left lung 12/10/2018   Pneumonia     Past Surgical History:  Procedure Laterality Date   APPENDECTOMY     ESOPHAGOGASTRODUODENOSCOPY ENDOSCOPY     FIXATION KYPHOPLASTY LUMBAR SPINE  12/14/2011   KYPHOPLASTY N/A 08/10/2015   Procedure: Thoracic four  and thoracic five kyphoplasty;  Surgeon: Kevan Ny Ditty, MD;  Location: [facility];  Service: Neurosurgery;  Laterality: N/A;  T4 and T5 kyphoplasties   LUNG SURGERY     fluid removed after pneumonia   TONSILLECTOMY     TUBAL LIGATION      Family History  Problem Relation Age of Onset   Ovarian cancer Mother    Hypertension Mother    Cancer Mother        uterine   Prostate cancer Father    Hypertension Father    Cancer Father        lung   Dementia Father    Diabetes Sister     Social History   Socioeconomic History   Marital status: Single    Spouse name: Not on file   Number of children: 0   Years of education: Not on file   Highest education level: Not on file  Occupational History   Occupation: Scientist, clinical (histocompatibility and immunogenetics): [employer]  Tobacco Use   Smoking status: Every Day    Packs/day: 0.75    Years: 52.00    Pack years: 39.00    Types: Cigarettes    Start date: 12/09/1968   Smokeless tobacco: Never   Tobacco comments:    cutting down   Substance and Sexual Activity   Alcohol use: Yes  Alcohol/week: 14.0 standard drinks    Types: 14 Standard drinks or equivalent per week    Comment: approx one drink per day   Drug use: No   Sexual activity: Yes    Partners: Male  Other Topics Concern   Not on file  Social History Narrative   Exercise--no   Social Determinants of Health   Financial Resource Strain: Low Risk    Difficulty of Paying Living Expenses: Not hard at all  Food Insecurity: No Food Insecurity   Worried About Charity fundraiser in the Last Year: Never true   Arboriculturist in the Last Year: Never true  Transportation Needs: No Transportation Needs   Lack of Transportation (Medical): No   Lack of Transportation (Non-Medical): No  Physical Activity: Inactive   Days of Exercise per Week: 0 days   Minutes of Exercise per Session: 0 min  Stress: No Stress Concern Present   Feeling of Stress : Not at all  Social Connections: Moderately  Isolated   Frequency of Communication with Friends and Family: More than three times a week   Frequency of Social Gatherings with Friends and Family: More than three times a week   Attends Religious Services: More than 4 times per year   Active Member of [employer] or Organizations: No   Attends Archivist Meetings: Never   Marital Status: Divorced  Human resources officer Violence: Not At Risk   Fear of Current or Ex-Partner: No   Emotionally Abused: No   Physically Abused: No   Sexually Abused: No    Outpatient Medications Prior to Visit  Medication Sig Dispense Refill   amLODipine (NORVASC) 10 MG tablet TAKE 1 TABLET(10 MG) BY MOUTH DAILY 90 tablet 3   Cholecalciferol (VITAMIN D) 50 MCG (2000 UT) CAPS Take 1 capsule by mouth daily.     flintstones complete (FLINTSTONES) 60 MG chewable tablet Chew 1 tablet by mouth daily.     Fluticasone-Umeclidin-Vilant (TRELEGY ELLIPTA) 100-62.5-25 MCG/INH AEPB Inhale 1 puff into the lungs daily. 2 each 0   magic mouthwash w/lidocaine SOLN Take 5 mLs by mouth 4 (four) times daily. Swish, gargle, spit out. 240 mL 0   metoprolol succinate (TOPROL-XL) 25 MG 24 hr tablet TAKE 1 TABLET(25 MG) BY MOUTH DAILY 90 tablet 3   multivitamin-lutein (OCUVITE-LUTEIN) CAPS capsule Take 1 capsule by mouth daily.     UNABLE TO FIND Med Name: pt reports taking calcium supplement, unsure of dose     No facility-administered medications prior to visit.    Allergies  Allergen Reactions   Codeine     Abdominal cramping    Review of Systems  Constitutional:  Negative for fever and malaise/fatigue.  HENT:  Positive for congestion. Negative for sore throat.   Respiratory:  Positive for cough.       Objective:    Physical Exam Constitutional:      General: She is not in acute distress.    Appearance: Normal appearance. She is not ill-appearing.  HENT:     Head: Normocephalic and atraumatic.     Right Ear: Ear canal and external ear normal. There is impacted  cerumen.     Left Ear: Tympanic membrane, ear canal and external ear normal.  Eyes:     Extraocular Movements: Extraocular movements intact.     Pupils: Pupils are equal, round, and reactive to light.  Cardiovascular:     Rate and Rhythm: Normal rate and regular rhythm.     Heart sounds:  Normal heart sounds. No murmur heard.   No gallop.  Pulmonary:     Effort: Pulmonary effort is normal. No respiratory distress.     Breath sounds: Normal breath sounds. No wheezing or rales.  Skin:    General: Skin is warm and dry.  Neurological:     Mental Status: She is alert and oriented to person, place, and time.  Psychiatric:        Behavior: Behavior normal.    BP (!) 153/74 (BP Location: Right Arm, Patient Position: Sitting, Cuff Size: Small)    Pulse 84    Temp 98.1 F (36.7 C) (Oral)    Resp 16    Ht 5' (1.524 m)    Wt 164 lb (74.4 kg)    SpO2 99%    BMI 32.03 kg/m  Wt Readings from Last 3 Encounters:  09/20/21 164 lb (74.4 kg)  07/19/21 165 lb (74.8 kg)  10/27/20 165 lb 3.2 oz (74.9 kg)       Assessment & Plan:   Problem List Items Addressed This Visit       Unprioritized   Acute sinusitis - Primary    New. Will rx with augmentin.       Relevant Medications   amoxicillin-clavulanate (AUGMENTIN) 875-125 MG tablet   benzonatate (TESSALON) 100 MG capsule     Meds ordered this encounter  Medications   amoxicillin-clavulanate (AUGMENTIN) 875-125 MG tablet    Sig: Take 1 tablet by mouth 2 (two) times daily.    Dispense:  20 tablet    Refill:  0    Order Specific Question:   Supervising Provider    Answer:   Penni Homans A [[id number]]   benzonatate (TESSALON) 100 MG capsule    Sig: Take 1 capsule (100 mg total) by mouth 3 (three) times daily as needed.    Dispense:  20 capsule    Refill:  0    Order Specific Question:   Supervising Provider    Answer:   Penni Homans A [[id number]]    I, Debbrah Alar NP, personally preformed the services described in this documentation.   All medical record entries made by the scribe were at my direction and in my presence.  I have reviewed the chart and discharge instructions (if applicable) and agree that the record reflects my personal performance and is accurate and complete. 09/20/2021   I,Shehryar Baig,acting as a Education administrator for Nance Pear, NP.,have documented all relevant documentation on the behalf of Nance Pear, NP,as directed by  Nance Pear, NP while in the presence of Nance Pear, NP.   Nance Pear, NP

## 2021-09-20 NOTE — Assessment & Plan Note (Signed)
New. Will rx with augmentin.

## 2021-09-25 ENCOUNTER — Ambulatory Visit (INDEPENDENT_AMBULATORY_CARE_PROVIDER_SITE_OTHER): Payer: Medicare Other | Admitting: Family Medicine

## 2021-09-25 ENCOUNTER — Encounter: Payer: Self-pay | Admitting: Family Medicine

## 2021-09-25 VITALS — BP 138/86 | HR 78 | Temp 98.0°F | Resp 20 | Ht 60.0 in | Wt 164.4 lb

## 2021-09-25 DIAGNOSIS — I10 Essential (primary) hypertension: Secondary | ICD-10-CM | POA: Diagnosis not present

## 2021-09-25 LAB — CBC WITH DIFFERENTIAL/PLATELET
Basophils Absolute: 0 10*3/uL (ref 0.0–0.1)
Basophils Relative: 0.7 % (ref 0.0–3.0)
Eosinophils Absolute: 0.2 10*3/uL (ref 0.0–0.7)
Eosinophils Relative: 3.6 % (ref 0.0–5.0)
HCT: 44.1 % (ref 36.0–46.0)
Hemoglobin: 14.8 g/dL (ref 12.0–15.0)
Lymphocytes Relative: 22 % (ref 12.0–46.0)
Lymphs Abs: 1.3 10*3/uL (ref 0.7–4.0)
MCHC: 33.5 g/dL (ref 30.0–36.0)
MCV: 96.4 fl (ref 78.0–100.0)
Monocytes Absolute: 0.6 10*3/uL (ref 0.1–1.0)
Monocytes Relative: 9.9 % (ref 3.0–12.0)
Neutro Abs: 3.9 10*3/uL (ref 1.4–7.7)
Neutrophils Relative %: 63.8 % (ref 43.0–77.0)
Platelets: 198 10*3/uL (ref 150.0–400.0)
RBC: 4.58 Mil/uL (ref 3.87–5.11)
RDW: 13.9 % (ref 11.5–15.5)
WBC: 6 10*3/uL (ref 4.0–10.5)

## 2021-09-25 LAB — COMPREHENSIVE METABOLIC PANEL
ALT: 15 U/L (ref 0–35)
AST: 20 U/L (ref 0–37)
Albumin: 4.1 g/dL (ref 3.5–5.2)
Alkaline Phosphatase: 56 U/L (ref 39–117)
BUN: 13 mg/dL (ref 6–23)
CO2: 27 mEq/L (ref 19–32)
Calcium: 9.9 mg/dL (ref 8.4–10.5)
Chloride: 97 mEq/L (ref 96–112)
Creatinine, Ser: 0.82 mg/dL (ref 0.40–1.20)
GFR: 70.32 mL/min (ref >60.00)
Glucose, Bld: 85 mg/dL (ref 70–99)
Potassium: 4.7 mEq/L (ref 3.5–5.1)
Sodium: 133 mEq/L — ABNORMAL LOW (ref 135–145)
Total Bilirubin: 0.7 mg/dL (ref 0.2–1.2)
Total Protein: 6.9 g/dL (ref 6.0–8.3)

## 2021-09-25 LAB — LIPID PANEL
Cholesterol: 195 mg/dL (ref 0–200)
HDL: 49.6 mg/dL (ref >39.00)
LDL Cholesterol: 120 mg/dL — ABNORMAL HIGH (ref 0–99)
NonHDL: 145.09
Total CHOL/HDL Ratio: 4
Triglycerides: 125 mg/dL (ref 0.0–149.0)
VLDL: 25 mg/dL (ref 0.0–40.0)

## 2021-09-25 MED ORDER — METOPROLOL SUCCINATE ER 25 MG PO TB24: ORAL_TABLET | ORAL | 3 refills | Status: DC

## 2021-09-25 MED ORDER — AMLODIPINE BESYLATE 10 MG PO TABS: ORAL_TABLET | ORAL | 3 refills | Status: DC

## 2021-09-25 NOTE — Patient Instructions (Signed)
Preventive Care 65 Years and Older, Female °Preventive care refers to lifestyle choices and visits with your health care provider that can promote health and wellness. Preventive care visits are also called wellness exams. °What can I expect for my preventive care visit? °Counseling °Your health care provider may ask you questions about your: °Medical history, including: °Past medical problems. °Family medical history. °Pregnancy and menstrual history. °History of falls. °Current health, including: °Memory and ability to understand (cognition). °Emotional well-being. °Home life and relationship well-being. °Sexual activity and sexual health. °Lifestyle, including: °Alcohol, nicotine or tobacco, and drug use. °Access to firearms. °Diet, exercise, and sleep habits. °Work and work environment. °Sunscreen use. °Safety issues such as seatbelt and bike helmet use. °Physical exam °Your health care provider will check your: °Height and weight. These may be used to calculate your BMI (body mass index). BMI is a measurement that tells if you are at a healthy weight. °Waist circumference. This measures the distance around your waistline. This measurement also tells if you are at a healthy weight and may help predict your risk of certain diseases, such as type 2 diabetes and high blood pressure. °Heart rate and blood pressure. °Body temperature. °Skin for abnormal spots. °What immunizations do I need? °Vaccines are usually given at various ages, according to a schedule. Your health care provider will recommend vaccines for you based on your age, medical history, and lifestyle or other factors, such as travel or where you work. °What tests do I need? °Screening °Your health care provider may recommend screening tests for certain conditions. This may include: °Lipid and cholesterol levels. °Hepatitis C test. °Hepatitis B test. °HIV (human immunodeficiency virus) test. °STI (sexually transmitted infection) testing, if you are at  risk. °Lung cancer screening. °Colorectal cancer screening. °Diabetes screening. This is done by checking your blood sugar (glucose) after you have not eaten for a while (fasting). °Mammogram. Talk with your health care provider about how often you should have regular mammograms. °BRCA-related cancer screening. This may be done if you have a family history of breast, ovarian, tubal, or peritoneal cancers. °Bone density scan. This is done to screen for osteoporosis. °Talk with your health care provider about your test results, treatment options, and if necessary, the need for more tests. °Follow these instructions at home: °Eating and drinking ° °Eat a diet that includes fresh fruits and vegetables, whole grains, lean protein, and low-fat dairy products. Limit your intake of foods with high amounts of sugar, saturated fats, and salt. °Take vitamin and mineral supplements as recommended by your health care provider. °Do not drink alcohol if your health care provider tells you not to drink. °If you drink alcohol: °Limit how much you have to 0-1 drink a day. °Know how much alcohol is in your drink. In the [place] one drink equals one 12 oz bottle of beer (355 mL), one 5 oz glass of wine (148 mL), or one 1½ oz glass of hard liquor (44 mL). °Lifestyle °Brush your teeth every morning and night with fluoride toothpaste. Floss one time each day. °Exercise for at least 30 minutes 5 or more days each week. °Do not use any products that contain nicotine or tobacco. These products include cigarettes, chewing tobacco, and vaping devices, such as e-cigarettes. If you need help quitting, ask your health care provider. °Do not use drugs. °If you are sexually active, practice safe sex. Use a condom or other form of protection in order to prevent STIs. °Take aspirin only as told by your   health care provider. Make sure that you understand how much to take and what form to take. Work with your health care provider to find out whether it  is safe and beneficial for you to take aspirin daily. Ask your health care provider if you need to take a cholesterol-lowering medicine (statin). Find healthy ways to manage stress, such as: Meditation, yoga, or listening to music. Journaling. Talking to a trusted person. Spending time with friends and family. Minimize exposure to UV radiation to reduce your risk of skin cancer. Safety Always wear your seat belt while driving or riding in a vehicle. Do not drive: If you have been drinking alcohol. Do not ride with someone who has been drinking. When you are tired or distracted. While texting. If you have been using any mind-altering substances or drugs. Wear a helmet and other protective equipment during sports activities. If you have firearms in your house, make sure you follow all gun safety procedures. What's next? Visit your health care provider once a year for an annual wellness visit. Ask your health care provider how often you should have your eyes and teeth checked. Stay up to date on all vaccines. This information is not intended to replace advice given to you by your health care provider. Make sure you discuss any questions you have with your health care provider. Document Revised: 03/22/2021 Document Reviewed: 03/22/2021 Elsevier Patient Education  [place].

## 2021-09-25 NOTE — Progress Notes (Signed)
Subjective:     Pam Cameron is a 75 y.o. female and is here for a comprehensive physical exam. The patient reports no problems.  Social History   Socioeconomic History   Marital status: Single    Spouse name: Not on file   Number of children: 0   Years of education: Not on file   Highest education level: Not on file  Occupational History   Occupation: sales    Employer: [employer]  Tobacco Use   Smoking status: Every Day    Packs/day: 0.75    Years: 52.00    Pack years: 39.00    Types: Cigarettes    Start date: 12/09/1968   Smokeless tobacco: Never   Tobacco comments:    cutting down   Substance and Sexual Activity   Alcohol use: Yes    Alcohol/week: 14.0 standard drinks    Types: 14 Standard drinks or equivalent per week    Comment: approx one drink per day   Drug use: No   Sexual activity: Yes    Partners: Male  Other Topics Concern   Not on file  Social History Narrative   Exercise--no   Social Determinants of Health   Financial Resource Strain: Low Risk    Difficulty of Paying Living Expenses: Not hard at all  Food Insecurity: No Food Insecurity   Worried About Charity fundraiser in the Last Year: Never true   Arboriculturist in the Last Year: Never true  Transportation Needs: No Transportation Needs   Lack of Transportation (Medical): No   Lack of Transportation (Non-Medical): No  Physical Activity: Inactive   Days of Exercise per Week: 0 days   Minutes of Exercise per Session: 0 min  Stress: No Stress Concern Present   Feeling of Stress : Not at all  Social Connections: Moderately Isolated   Frequency of Communication with Friends and Family: More than three times a week   Frequency of Social Gatherings with Friends and Family: More than three times a week   Attends Religious Services: More than 4 times per year   Active Member of Clubs or Organizations: No   Attends Archivist Meetings: Never   Marital Status: Divorced  Arboriculturist Violence: Not At Risk   Fear of Current or Ex-Partner: No   Emotionally Abused: No   Physically Abused: No   Sexually Abused: No   Health Maintenance  Topic Date Due   Zoster Vaccines- Shingrix (1 of 2) Never done   COVID-19 Vaccine (3 - Pfizer risk series) 01/13/2020   INFLUENZA VACCINE  05/08/2021   COLONOSCOPY (Pts 45-6yrs Insurance coverage will need to be confirmed)  10/09/2021   TETANUS/TDAP  10/09/2021   DEXA SCAN  07/13/2022   MAMMOGRAM  07/25/2022   Pneumonia Vaccine 54+ Years old  Completed   Hepatitis C Screening  Completed   HPV VACCINES  Aged Out    The following portions of the patient's history were reviewed and updated as appropriate: She  has a past medical history of Anemia, Blood transfusion, Cataracts, bilateral, CHF (congestive heart failure) ([place]), COPD (chronic obstructive pulmonary disease) ([place]), Depression, Hypertension, left lung ca (dx'd 08/2018), Nodule of lower lobe of left lung (12/10/2018), and Pneumonia. She does not have any pertinent problems on file. She  has a past surgical history that includes Appendectomy; Tonsillectomy; Lung surgery; Fixation kyphoplasty lumbar spine (12/14/2011); Tubal ligation; Esophagogastroduodenoscopy endoscopy; and Kyphoplasty (N/A, 08/10/2015). Her family history includes Cancer in her  father and mother; Dementia in her father; Diabetes in her sister; Hypertension in her father and mother; Ovarian cancer in her mother; Prostate cancer in her father. She  reports that she has been smoking cigarettes. She started smoking about 52 years ago. She has a 40.50 pack-year smoking history. She has never used smokeless tobacco. She reports current alcohol use of about 14.0 standard drinks per week. She reports that she does not use drugs. She has a current medication list which includes the following prescription(s): amoxicillin-clavulanate, benzonatate, vitamin d, flintstones complete, trelegy ellipta, magic mouthwash w/lidocaine,  multivitamin-lutein, UNABLE TO FIND, amlodipine, and metoprolol succinate. Current Outpatient Medications on File Prior to Visit  Medication Sig Dispense Refill   amoxicillin-clavulanate (AUGMENTIN) 875-125 MG tablet Take 1 tablet by mouth 2 (two) times daily. 20 tablet 0   benzonatate (TESSALON) 100 MG capsule Take 1 capsule (100 mg total) by mouth 3 (three) times daily as needed. 20 capsule 0   Cholecalciferol (VITAMIN D) 50 MCG (2000 UT) CAPS Take 1 capsule by mouth daily.     flintstones complete (FLINTSTONES) 60 MG chewable tablet Chew 1 tablet by mouth daily.     Fluticasone-Umeclidin-Vilant (TRELEGY ELLIPTA) 100-62.5-25 MCG/INH AEPB Inhale 1 puff into the lungs daily. 2 each 0   magic mouthwash w/lidocaine SOLN Take 5 mLs by mouth 4 (four) times daily. Swish, gargle, spit out. 240 mL 0   multivitamin-lutein (OCUVITE-LUTEIN) CAPS capsule Take 1 capsule by mouth daily.     UNABLE TO FIND Med Name: pt reports taking calcium supplement, unsure of dose     No current facility-administered medications on file prior to visit.   She is allergic to codeine..  Review of Systems Review of Systems  Constitutional: Negative for activity change, appetite change and fatigue.  HENT: Negative for hearing loss, congestion, tinnitus and ear discharge.  dentist q56m Eyes: Negative for visual disturbance (see optho q1y -- vision corrected to 20/20 with glasses).  Respiratory: Negative for cough, chest tightness and shortness of breath.   Cardiovascular: Negative for chest pain, palpitations and leg swelling.  Gastrointestinal: Negative for abdominal pain, diarrhea, constipation and abdominal distention.  Genitourinary: Negative for urgency, frequency, decreased urine volume and difficulty urinating.  Musculoskeletal: Negative for back pain, arthralgias and gait problem.  Skin: Negative for color change, pallor and rash.  Neurological: Negative for dizziness, light-headedness, numbness and headaches.   Hematological: Negative for adenopathy. Does not bruise/bleed easily.  Psychiatric/Behavioral: Negative for suicidal ideas, confusion, sleep disturbance, self-injury, dysphoric mood, decreased concentration and agitation.      Objective:    BP 138/86 (BP Location: Left Arm, Patient Position: Sitting, Cuff Size: Normal)    Pulse 78    Temp 98 F (36.7 C) (Oral)    Resp 20    Ht 5' (1.524 m)    Wt 164 lb 6.4 oz (74.6 kg)    SpO2 95%    BMI 32.11 kg/m  General appearance: alert, cooperative, and no distress Head: Normocephalic, without obvious abnormality, atraumatic Eyes: conjunctivae/corneas clear. PERRL, EOM's intact. Fundi benign. Ears: normal TM's and external ear canals both ears Nose: Nares normal. Septum midline. Mucosa normal. No drainage or sinus tenderness. Throat: lips, mucosa, and tongue normal; teeth and gums normal Neck: no adenopathy, no carotid bruit, no JVD, supple, symmetrical, trachea midline, and thyroid not enlarged, symmetric, no tenderness/mass/nodules Back: symmetric, no curvature. ROM normal. No CVA tenderness. Lungs: clear to auscultation bilaterally Heart: regular rate and rhythm, S1, S2 normal, no murmur, click, rub or gallop  Abdomen: soft, non-tender; bowel sounds normal; no masses,  no organomegaly Extremities: extremities normal, atraumatic, no cyanosis or edema Pulses: 2+ and symmetric Skin: Skin color, texture, turgor normal. No rashes or lesions Lymph nodes: Cervical, supraclavicular, and axillary nodes normal. Neurologic: Alert and oriented X 3, normal strength and tone. Normal symmetric reflexes. Normal coordination and gait    Assessment:    Healthy female exam.               Plan:    Ghm utd Check labs  See After Visit Summary for Counseling Recommendations   \  1. Primary hypertension Well controlled, no changes to meds. Encouraged heart healthy diet such as the DASH diet and exercise as tolerated.   - Comprehensive metabolic  panel - CBC with Differential/Platelet - Lipid panel  2. Essential hypertension   - metoprolol succinate (TOPROL-XL) 25 MG 24 hr tablet; 1 po qd  Dispense: 90 tablet; Refill: 3 - amLODipine (NORVASC) 10 MG tablet; 1 po qd  Dispense: 90 tablet; Refill: 3

## 2021-10-23 ENCOUNTER — Telehealth: Payer: Self-pay | Admitting: Pulmonary Disease

## 2021-10-23 MED ORDER — FLUTICASONE-UMECLIDIN-VILANT 100-62.5-25 MCG/ACT IN AEPB: 1.0000 | INHALATION_SPRAY | Freq: Every day | RESPIRATORY_TRACT | 0 refills | Status: DC

## 2021-10-23 NOTE — Telephone Encounter (Signed)
Spoke with the pt  She is needing trelegy refill  Pt overdue for rov with BI  I scheduled appt and have refilled med x 1 only

## 2021-10-24 ENCOUNTER — Other Ambulatory Visit: Payer: Self-pay | Admitting: Radiology

## 2021-10-24 DIAGNOSIS — R911 Solitary pulmonary nodule: Secondary | ICD-10-CM

## 2021-10-24 DIAGNOSIS — J449 Chronic obstructive pulmonary disease, unspecified: Secondary | ICD-10-CM

## 2021-11-09 ENCOUNTER — Encounter: Payer: Self-pay | Admitting: Adult Health

## 2021-11-09 ENCOUNTER — Telehealth: Payer: Self-pay | Admitting: Acute Care

## 2021-11-09 ENCOUNTER — Telehealth: Payer: Self-pay | Admitting: *Deleted

## 2021-11-09 ENCOUNTER — Other Ambulatory Visit: Payer: Self-pay

## 2021-11-09 ENCOUNTER — Ambulatory Visit: Payer: Medicare Other | Admitting: Adult Health

## 2021-11-09 ENCOUNTER — Other Ambulatory Visit: Payer: Self-pay | Admitting: Family Medicine

## 2021-11-09 DIAGNOSIS — R911 Solitary pulmonary nodule: Secondary | ICD-10-CM

## 2021-11-09 DIAGNOSIS — F17200 Nicotine dependence, unspecified, uncomplicated: Secondary | ICD-10-CM

## 2021-11-09 DIAGNOSIS — Z87891 Personal history of nicotine dependence: Secondary | ICD-10-CM

## 2021-11-09 DIAGNOSIS — J449 Chronic obstructive pulmonary disease, unspecified: Secondary | ICD-10-CM | POA: Diagnosis not present

## 2021-11-09 DIAGNOSIS — F17210 Nicotine dependence, cigarettes, uncomplicated: Secondary | ICD-10-CM

## 2021-11-09 DIAGNOSIS — Z720 Tobacco use: Secondary | ICD-10-CM

## 2021-11-09 MED ORDER — FLUTICASONE-UMECLIDIN-VILANT 100-62.5-25 MCG/ACT IN AEPB: 1.0000 | INHALATION_SPRAY | Freq: Every day | RESPIRATORY_TRACT | 11 refills | Status: DC

## 2021-11-09 MED ORDER — ALBUTEROL SULFATE HFA 108 (90 BASE) MCG/ACT IN AERS: 1.0000 | INHALATION_SPRAY | Freq: Four times a day (QID) | RESPIRATORY_TRACT | 2 refills | Status: DC | PRN

## 2021-11-09 NOTE — Assessment & Plan Note (Signed)
Smoking cessation discussed in detail 

## 2021-11-09 NOTE — Patient Instructions (Addendum)
We will check on Lung cancer CT chest screening (due now ) .  Continue on TRELEGY 1 puff daily (rinse after use)  Albuterol inhaler As needed  wheezing/shortness of breath.  Activity as tolerated.  Work on quitting smoking .  Follow up with Dr. Valeta Harms in 1 year and As needed

## 2021-11-09 NOTE — Assessment & Plan Note (Signed)
Suspicious left lower lobe lung nodule status post SBRT in 2021. Follow-up CT chest in January 2022 showed stability in left lower lobe in the area of the treated lesion. Patient is due for follow-up CT chest.  She has been reenrolled into the low-dose CT screening program.  We will contact the program to make sure her CT is completed in the near future.

## 2021-11-09 NOTE — Telephone Encounter (Signed)
Patient requested to return to [facility] post cancer diagnosis.  Patient is not eligible for screening until 5 years cancer free under [facility] guidelines. Discussed with patient reason that CT chest can't be done under [facility].  Patient would like to have this ordered outside of the screening program through her PCP.  Message routed to PCP to reach out to patient if agreeable to ordering CT Chest w/o contrast.

## 2021-11-09 NOTE — Assessment & Plan Note (Signed)
Appears stable on maintenance regimen  Smoking cessation discussed   Plan  Patient Instructions  We will check on Lung cancer CT chest screening (due now ) .  Continue on TRELEGY 1 puff daily (rinse after use)  Albuterol inhaler As needed  wheezing/shortness of breath.  Activity as tolerated.  Work on quitting smoking .  Follow up with Dr. Valeta Harms in 1 year and As needed

## 2021-11-09 NOTE — Progress Notes (Signed)
@Patient  ID: Pam Cameron, female    DOB: Aug 12, 1946, 76 y.o.   MRN: [id number]  Chief Complaint  Patient presents with   Follow-up    Referring provider: Ann Held, *  HPI: 76 year old female active smoker followed for severe COPD, presumed lung cancer with suspicious left lower lobe lung nodule status post SBRT in 2021 followed by radiation oncology  TEST/EVENTS :  CT chest October 14, 2020 marked pulmonary emphysema, mild pleural and parenchymal scarring, groundglass and volume loss in the left lower lobe at the site of treated lesion  PFTs December 02, 2018 showed severe COPD with reversibility -FEV1 at 47%, ratio 53, FVC 66%, significant bronchodilator response (25% change), DLCO 62%.  11/09/2021 Follow up: COPD , Presumed Lung Cancer s/p LLL nodule SBRT  Patient presents for follow-up visit.  She was last seen in December 2021.  She has underlying severe COPD.  She is maintained on Trelegy inhaler. Says overall she is doing okay.  Gets winded with heavy activity, gets short of breath if she has to carry something or go up an incline.  Has minimal coughing . No hemoptysis or weight loss. No chest pain .  No albuterol use. No recent hospitalizations or ER visits.    She is followed by radiation oncology.  With previous suspicious left lower lobe nodule status post SBRT 2021.  Last CT chest October 14, 2020 that showed groundglass and volume loss in the left lower lobe of the treated lesion. She has been referred back to [facility] screening program. She is due for CT now.   Declines flu shot today .   Lives alone, active house chores. No exercise. Drives and does shopping .   Allergies  Allergen Reactions   Codeine     Abdominal cramping    Immunization History  Administered Date(s) Administered   Fluad Quad(high Dose 65+) 07/01/2019   PFIZER(Purple Top)SARS-COV-2 Vaccination 11/21/2019, 12/16/2019   Pneumococcal Conjugate-13 06/30/2015   Pneumococcal  Polysaccharide-23 03/24/2012, 06/20/2017   Tdap 10/10/2011   Zoster, Live 03/24/2012    Past Medical History:  Diagnosis Date   Anemia    Blood transfusion    Cataracts, bilateral    CHF (congestive heart failure) ([facility])    COPD (chronic obstructive pulmonary disease) ([place])    Depression    Hypertension    left lung ca dx'd 08/2018   SBRT comp 01/20/19   Nodule of lower lobe of left lung 12/10/2018   Pneumonia     Tobacco History: Social History   Tobacco Use  Smoking Status Every Day   Packs/day: 0.75   Years: 54.00   Pack years: 40.50   Types: Cigarettes   Start date: 12/09/1968  Smokeless Tobacco Never  Tobacco Comments   Smoking less than a pack a day, cutting down.  11/09/2021   Ready to quit: No Counseling given: Yes Tobacco comments: Smoking less than a pack a day, cutting down.  11/09/2021   Outpatient Medications Prior to Visit  Medication Sig Dispense Refill   amLODipine (NORVASC) 10 MG tablet 1 po qd 90 tablet 3   Cholecalciferol (VITAMIN D) 50 MCG (2000 UT) CAPS Take 1 capsule by mouth daily.     flintstones complete (FLINTSTONES) 60 MG chewable tablet Chew 1 tablet by mouth daily.     metoprolol succinate (TOPROL-XL) 25 MG 24 hr tablet 1 po qd 90 tablet 3   multivitamin-lutein (OCUVITE-LUTEIN) CAPS capsule Take 1 capsule by mouth daily.     UNABLE TO  FIND Med Name: pt reports taking calcium supplement, unsure of dose     Fluticasone-Umeclidin-Vilant (TRELEGY ELLIPTA) 100-62.5-25 MCG/ACT AEPB Inhale 1 puff into the lungs daily. 60 each 0   amoxicillin-clavulanate (AUGMENTIN) 875-125 MG tablet Take 1 tablet by mouth 2 (two) times daily. (Patient not taking: Reported on 11/09/2021) 20 tablet 0   benzonatate (TESSALON) 100 MG capsule Take 1 capsule (100 mg total) by mouth 3 (three) times daily as needed. (Patient not taking: Reported on 11/09/2021) 20 capsule 0   magic mouthwash w/lidocaine SOLN Take 5 mLs by mouth 4 (four) times daily. Swish, gargle, spit out. (Patient  not taking: Reported on 11/09/2021) 240 mL 0   No facility-administered medications prior to visit.     Review of Systems:   Constitutional:   No  weight loss, night sweats,  Fevers, chills, fatigue, or  lassitude.  HEENT:   No headaches,  Difficulty swallowing,  Tooth/dental problems, or  Sore throat,                No sneezing, itching, ear ache, nasal congestion, post nasal drip,   CV:  No chest pain,  Orthopnea, PND, swelling in lower extremities, anasarca, dizziness, palpitations, syncope.   GI  No heartburn, indigestion, abdominal pain, nausea, vomiting, diarrhea, change in bowel habits, loss of appetite, bloody stools.   Resp: No shortness of breath with exertion or at rest.  No excess mucus, no productive cough,  No non-productive cough,  No coughing up of blood.  No change in color of mucus.  No wheezing.  No chest wall deformity  Skin: no rash or lesions.  GU: no dysuria, change in color of urine, no urgency or frequency.  No flank pain, no hematuria   MS:  No joint pain or swelling.  No decreased range of motion.  No back pain.    Physical Exam  BP 140/70 (BP Location: Left Arm, Patient Position: Sitting, Cuff Size: Normal)    Pulse 83    Temp 97.7 F (36.5 C) (Oral)    Ht 5' (1.524 m)    Wt 168 lb (76.2 kg)    SpO2 93%    BMI 32.81 kg/m   GEN: A/Ox3; pleasant , NAD, well nourished    HEENT:  Peak/AT,  EACs-clear, TMs-wnl, NOSE-clear, THROAT-clear, no lesions, no postnasal drip or exudate noted.   NECK:  Supple w/ fair ROM; no JVD; normal carotid impulses w/o bruits; no thyromegaly or nodules palpated; no lymphadenopathy.    RESP  Clear  P & A; w/o, wheezes/ rales/ or rhonchi. no accessory muscle use, no dullness to percussion  CARD:  RRR, no m/r/g, no peripheral edema, pulses intact, no cyanosis or clubbing.  GI:   Soft & nt; nml bowel sounds; no organomegaly or masses detected.   Musco: Warm bil, no deformities or joint swelling noted.   Neuro: alert, no focal  deficits noted.    Skin: Warm, no lesions or rashes    Lab Results:  CBC    Component Value Date/Time   WBC 6.0 09/25/2021 1105   RBC 4.58 09/25/2021 1105   HGB 14.8 09/25/2021 1105   HCT 44.1 09/25/2021 1105   PLT 198.0 09/25/2021 1105   MCV 96.4 09/25/2021 1105   MCH 32.9 08/09/2015 0933   MCHC 33.5 09/25/2021 1105   RDW 13.9 09/25/2021 1105   LYMPHSABS 1.3 09/25/2021 1105   MONOABS 0.6 09/25/2021 1105   EOSABS 0.2 09/25/2021 1105   BASOSABS 0.0 09/25/2021 1105  BMET    Component Value Date/Time   NA 133 (L) 09/25/2021 1105   K 4.7 09/25/2021 1105   CL 97 09/25/2021 1105   CO2 27 09/25/2021 1105   GLUCOSE 85 09/25/2021 1105   BUN 13 09/25/2021 1105   CREATININE 0.82 09/25/2021 1105   CALCIUM 9.9 09/25/2021 1105   GFRNONAA >60 08/09/2015 0933   GFRAA >60 08/09/2015 0933    BNP No results found for: BNP  ProBNP No results found for: PROBNP  Imaging: No results found.    PFT Results Latest Ref Rng & Units 12/02/2018  FVC-Pre L 1.29  FVC-Predicted Pre % 51  FVC-Post L 1.67  FVC-Predicted Post % 66  Pre FEV1/FVC % % 54  Post FEV1/FCV % % 53  FEV1-Pre L 0.70  FEV1-Predicted Pre % 37  FEV1-Post L 0.88  DLCO uncorrected ml/min/mmHg 10.55  DLCO [facility]% % 62  DLVA Predicted % 73  TLC L 4.72  TLC % Predicted % 105  RV % Predicted % 169    No results found for: NITRICOXIDE      Assessment & Plan:   COPD (chronic obstructive pulmonary disease) Appears stable on maintenance regimen  Smoking cessation discussed   Plan  Patient Instructions  We will check on Lung cancer CT chest screening (due now ) .  Continue on TRELEGY 1 puff daily (rinse after use)  Albuterol inhaler As needed  wheezing/shortness of breath.  Activity as tolerated.  Work on quitting smoking .  Follow up with Dr. Valeta Harms in 1 year and As needed          Tobacco abuse Smoking cessation discussed in detail.  Nodule of lower lobe of left lung Suspicious left lower lobe  lung nodule status post SBRT in 2021. Follow-up CT chest in January 2022 showed stability in left lower lobe in the area of the treated lesion. Patient is due for follow-up CT chest.  She has been reenrolled into the low-dose CT screening program.  We will contact the program to make sure her CT is completed in the near future.     Rexene Edison, NP 11/09/2021

## 2021-11-09 NOTE — Telephone Encounter (Signed)
Secure chat sent to [facility] RN and Doroteo Glassman RN to get her scheduled for her yearly LD CT scan.

## 2021-11-13 NOTE — Telephone Encounter (Signed)
Pt called and notified of orders.  Pt given imaging phone number.

## 2021-11-16 ENCOUNTER — Ambulatory Visit: Payer: Medicare Other | Admitting: Adult Health

## 2021-11-16 ENCOUNTER — Ambulatory Visit: Payer: Medicare Other | Admitting: Pulmonary Disease

## 2021-11-20 ENCOUNTER — Other Ambulatory Visit: Payer: Self-pay

## 2021-11-20 ENCOUNTER — Other Ambulatory Visit: Payer: Self-pay | Admitting: Family Medicine

## 2021-11-20 ENCOUNTER — Ambulatory Visit (HOSPITAL_BASED_OUTPATIENT_CLINIC_OR_DEPARTMENT_OTHER)
Admission: RE | Admit: 2021-11-20 | Discharge: 2021-11-20 | Disposition: A | Discharge status: Home / Self Care | Payer: Medicare Other | Source: Ambulatory Visit | Attending: Family Medicine | Admitting: Family Medicine

## 2021-11-20 DIAGNOSIS — R0600 Dyspnea, unspecified: Secondary | ICD-10-CM | POA: Diagnosis not present

## 2021-11-20 DIAGNOSIS — J439 Emphysema, unspecified: Secondary | ICD-10-CM | POA: Diagnosis not present

## 2021-11-20 DIAGNOSIS — F17200 Nicotine dependence, unspecified, uncomplicated: Secondary | ICD-10-CM | POA: Insufficient documentation

## 2021-11-20 DIAGNOSIS — I700 Atherosclerosis of aorta: Secondary | ICD-10-CM | POA: Diagnosis not present

## 2021-11-23 ENCOUNTER — Ambulatory Visit: Payer: Medicare Other

## 2022-07-17 DIAGNOSIS — H524 Presbyopia: Secondary | ICD-10-CM | POA: Diagnosis not present

## 2022-07-24 ENCOUNTER — Ambulatory Visit (INDEPENDENT_AMBULATORY_CARE_PROVIDER_SITE_OTHER): Payer: Medicare Other | Admitting: *Deleted

## 2022-07-24 DIAGNOSIS — Z0000 Encounter for general adult medical examination without abnormal findings: Secondary | ICD-10-CM | POA: Diagnosis not present

## 2022-07-24 NOTE — Progress Notes (Signed)
Subjective:   Pam Cameron is a 76 y.o. female who presents for Medicare Annual (Subsequent) preventive examination.  I connected with  Pam Cameron on 07/24/22 by a audio enabled telemedicine application and verified that I am speaking with the correct person using two identifiers.  Patient Location: Home  Provider Location: Office/Clinic  I discussed the limitations of evaluation and management by telemedicine. The patient expressed understanding and agreed to proceed.   Review of Systems    Defer to PCP Cardiac Risk Factors include: advanced age (>67men, >24 women);hypertension;sedentary lifestyle     Objective:    There were no vitals filed for this visit. There is no height or weight on file to calculate BMI.     07/24/2022    8:22 AM 07/19/2021    8:23 AM 10/27/2020   10:44 AM 05/27/2020    9:32 AM 04/14/2020   11:48 AM 10/15/2019   10:08 AM 05/04/2019   11:04 AM  Advanced Directives  Does Patient Have a Medical Advance Directive? No No No No No No No  Would patient like information on creating a medical advance directive? No - Patient declined Yes (MAU/Ambulatory/Procedural Areas - Information given) No - Patient declined No - Patient declined No - Patient declined  No - Patient declined    Current Medications (verified) Outpatient Encounter Medications as of 07/24/2022  Medication Sig   albuterol (VENTOLIN HFA) 108 (90 Base) MCG/ACT inhaler Inhale 1-2 puffs into the lungs every 6 (six) hours as needed.   amLODipine (NORVASC) 10 MG tablet 1 po qd   Cholecalciferol (VITAMIN D) 50 MCG (2000 UT) CAPS Take 1 capsule by mouth daily.   flintstones complete (FLINTSTONES) 60 MG chewable tablet Chew 1 tablet by mouth daily.   Fluticasone-Umeclidin-Vilant (TRELEGY ELLIPTA) 100-62.5-25 MCG/ACT AEPB Inhale 1 puff into the lungs daily.   metoprolol succinate (TOPROL-XL) 25 MG 24 hr tablet 1 po qd   multivitamin-lutein (OCUVITE-LUTEIN) CAPS capsule Take 1 capsule by mouth  daily.   UNABLE TO FIND Med Name: pt reports taking calcium supplement, unsure of dose   No facility-administered encounter medications on file as of 07/24/2022.    Allergies (verified) Codeine   History: Past Medical History:  Diagnosis Date   Anemia    Blood transfusion    Cataracts, bilateral    CHF (congestive heart failure) (HCC)    COPD (chronic obstructive pulmonary disease) ([place])    Depression    Hypertension    left lung ca dx'd 08/2018   SBRT comp 01/20/19   Nodule of lower lobe of left lung 12/10/2018   Pneumonia    Past Surgical History:  Procedure Laterality Date   APPENDECTOMY     ESOPHAGOGASTRODUODENOSCOPY ENDOSCOPY     FIXATION KYPHOPLASTY LUMBAR SPINE  12/14/2011   KYPHOPLASTY N/A 08/10/2015   Procedure: Thoracic four and thoracic five kyphoplasty;  Surgeon: Kevan Ny Ditty, MD;  Location: [facility];  Service: Neurosurgery;  Laterality: N/A;  T4 and T5 kyphoplasties   LUNG SURGERY     fluid removed after pneumonia   TONSILLECTOMY     TUBAL LIGATION     Family History  Problem Relation Age of Onset   Ovarian cancer Mother    Hypertension Mother    Cancer Mother        uterine   Prostate cancer Father    Hypertension Father    Cancer Father        lung   Dementia Father    Diabetes Sister  Social History   Socioeconomic History   Marital status: Single    Spouse name: Not on file   Number of children: 0   Years of education: Not on file   Highest education level: Not on file  Occupational History   Occupation: sales    Employer: [employer]  Tobacco Use   Smoking status: Every Day    Packs/day: 0.75    Years: 54.00    Total pack years: 40.50    Types: Cigarettes    Start date: 12/09/1968   Smokeless tobacco: Never   Tobacco comments:    Smoking less than a pack a day, cutting down.  11/09/2021  Substance and Sexual Activity   Alcohol use: Yes    Alcohol/week: 14.0 standard drinks of alcohol    Types: 14 Standard drinks or  equivalent per week    Comment: approx one drink per day   Drug use: No   Sexual activity: Yes    Partners: Male  Other Topics Concern   Not on file  Social History Narrative   Exercise--no   Social Determinants of Health   Financial Resource Strain: Low Risk  (07/19/2021)   Overall Financial Resource Strain (CARDIA)    Difficulty of Paying Living Expenses: Not hard at all  Food Insecurity: No Food Insecurity (07/19/2021)   Hunger Vital Sign    Worried About Running Out of Food in the Last Year: Never true    Ran Out of Food in the Last Year: Never true  Transportation Needs: No Transportation Needs (07/19/2021)   PRAPARE - Hydrologist (Medical): No    Lack of Transportation (Non-Medical): No  Physical Activity: Inactive (07/19/2021)   Exercise Vital Sign    Days of Exercise per Week: 0 days    Minutes of Exercise per Session: 0 min  Stress: No Stress Concern Present (07/19/2021)   [place]    Feeling of Stress : Not at all  Social Connections: Moderately Isolated (07/19/2021)   Social Connection and Isolation Panel [NHANES]    Frequency of Communication with Friends and Family: More than three times a week    Frequency of Social Gatherings with Friends and Family: More than three times a week    Attends Religious Services: More than 4 times per year    Active Member of [employer] or Organizations: No    Attends Archivist Meetings: Never    Marital Status: Divorced    Tobacco Counseling Ready to quit: Not Answered Counseling given: Not Answered Tobacco comments: Smoking less than a pack a day, cutting down.  11/09/2021   Clinical Intake:  Pre-visit preparation completed: Yes  Pain : No/denies pain  How often do you need to have someone help you when you read instructions, pamphlets, or other written materials from your doctor or pharmacy?: 1 - Never  Diabetic?  No  Activities of Daily Living    07/24/2022    8:24 AM  In your present state of health, do you have any difficulty performing the following activities:  Hearing? 0  Vision? 0  Difficulty concentrating or making decisions? 0  Walking or climbing stairs? 0  Dressing or bathing? 0  Doing errands, shopping? 0  Preparing Food and eating ? N  Using the Toilet? N  In the past six months, have you accidently leaked urine? Y  Do you have problems with loss of bowel control? N  Managing your Medications?  N  Managing your Finances? N  Housekeeping or managing your Housekeeping? N    Patient Care Team: Carollee Herter, Alferd Apa, DO as PCP - General Icard, Octavio Graves, DO as Consulting Physician (Pulmonary Disease) Gery Pray, MD as Consulting Physician (Radiation Oncology)  Indicate any recent Medical Services you may have received from other than [facility] providers in the past year (date may be approximate).     Assessment:   This is a routine wellness examination for Pam Cameron.  Hearing/Vision screen No results found.  Dietary issues and exercise activities discussed: Current Exercise Habits: The patient does not participate in regular exercise at present, Exercise limited by: None identified   Goals Addressed   None    Depression Screen    07/24/2022    8:23 AM 07/19/2021    8:26 AM 05/27/2020    9:35 AM 05/31/2016    3:14 PM 12/07/2014    2:48 PM 04/15/2013   10:42 AM 03/24/2012    8:12 AM  PHQ 2/9 Scores  PHQ - 2 Score 0 0 0 0 0 0 0    Fall Risk    07/24/2022    8:22 AM 07/19/2021    8:25 AM 05/27/2020    9:35 AM 05/31/2016    3:14 PM 12/07/2014    2:48 PM  [place] in the past year? 0 0 0 No No  Number falls in past yr: 0 0 0    Injury with Fall? 0 0 0    Risk for fall due to : No Fall Risks      Follow up Falls evaluation completed Falls prevention discussed Education provided;Falls prevention discussed      FALL RISK PREVENTION PERTAINING TO THE HOME:  Any  stairs in or around the home? Yes  If so, are there any without handrails? No  Home free of loose throw rugs in walkways, pet beds, electrical cords, etc? Yes  Adequate lighting in your home to reduce risk of falls? Yes   ASSISTIVE DEVICES UTILIZED TO PREVENT FALLS:  Life alert? No  Use of a cane, walker or w/c? No  Grab bars in the bathroom? No  Shower chair or bench in shower? No  Elevated toilet seat or a handicapped toilet? No   TIMED UP AND GO:  Was the test performed?  Audio visit .   Cognitive Function:    05/31/2016    3:42 PM  MMSE - Mini Mental State Exam  Orientation to time 5  Orientation to Place 5  Registration 3  Attention/ Calculation 5  Recall 3  Language- name 2 objects 2  Language- repeat 1  Language- follow 3 step command 3  Language- read & follow direction 1  Write a sentence 1  Copy design 1  Total score 30        07/24/2022    8:26 AM  6CIT Screen  What Year? 0 points  What month? 0 points  What time? 0 points  Count back from 20 0 points  Months in reverse 0 points  Repeat phrase 0 points  Total Score 0 points    Immunizations Immunization History  Administered Date(s) Administered   Fluad Quad(high Dose 65+) 07/01/2019   PFIZER(Purple Top)SARS-COV-2 Vaccination 11/21/2019, 12/16/2019   Pneumococcal Conjugate-13 06/30/2015   Pneumococcal Polysaccharide-23 03/24/2012, 06/20/2017   Tdap 10/10/2011   Zoster, Live 03/24/2012    TDAP status: Due, Education has been provided regarding the importance of this vaccine. Advised may receive this vaccine  at local pharmacy or Health Dept. Aware to provide a copy of the vaccination record if obtained from local pharmacy or Health Dept. Verbalized acceptance and understanding.  Flu Vaccine status: Due, Education has been provided regarding the importance of this vaccine. Advised may receive this vaccine at local pharmacy or Health Dept. Aware to provide a copy of the vaccination record if  obtained from local pharmacy or Health Dept. Verbalized acceptance and understanding.  Pneumococcal vaccine status: Up to date  Covid-19 vaccine status: Information provided on how to obtain vaccines.   Qualifies for Shingles Vaccine? Yes   Zostavax completed Yes   Shingrix Completed?: No.    Education has been provided regarding the importance of this vaccine. Patient has been advised to call insurance company to determine out of pocket expense if they have not yet received this vaccine. Advised may also receive vaccine at local pharmacy or Health Dept. Verbalized acceptance and understanding.  Screening Tests Health Maintenance  Topic Date Due   Zoster Vaccines- Shingrix (1 of 2) Never done   COVID-19 Vaccine (3 - Pfizer risk series) 01/13/2020   COLONOSCOPY (Pts 45-66yrs Insurance coverage will need to be confirmed)  10/09/2021   TETANUS/TDAP  10/09/2021   INFLUENZA VACCINE  05/08/2022   DEXA SCAN  07/13/2022   MAMMOGRAM  07/25/2022   Pneumonia Vaccine 97+ Years old  Completed   Hepatitis C Screening  Completed   HPV VACCINES  Aged Out    Health Maintenance  Health Maintenance Due  Topic Date Due   Zoster Vaccines- Shingrix (1 of 2) Never done   COVID-19 Vaccine (3 - Pfizer risk series) 01/13/2020   COLONOSCOPY (Pts 45-37yrs Insurance coverage will need to be confirmed)  10/09/2021   TETANUS/TDAP  10/09/2021   INFLUENZA VACCINE  05/08/2022   DEXA SCAN  07/13/2022    Colorectal cancer screening: Type of screening: Colonoscopy. Completed 10/10/11. Repeat every 10 years  Mammogram status: Completed 07/25/21. Repeat every year  Bone Density status: Completed 07/13/20. Results reflect: Bone density results: OSTEOPOROSIS. Repeat every 2 years.  Lung Cancer Screening: (Low Dose CT Chest recommended if Age 49-80 years, 30 pack-year currently smoking OR have quit w/in 15years.) does qualify.   Lung Cancer Screening Referral: N/a  Additional Screening:  Hepatitis C Screening:  does qualify; Completed 06/30/15  Vision Screening: Recommended annual ophthalmology exams for early detection of glaucoma and other disorders of the eye. Is the patient up to date with their annual eye exam?  Yes  Who is the provider or what is the name of the office in which the patient attends annual eye exams? Dr. Satira Sark If pt is not established with a provider, would they like to be referred to a provider to establish care? No .   Dental Screening: Recommended annual dental exams for proper oral hygiene  Community Resource Referral / Chronic Care Management: CRR required this visit?  No   CCM required this visit?  No      Plan:     I have personally reviewed and noted the following in the patient's chart:   Medical and social history Use of alcohol, tobacco or illicit drugs  Current medications and supplements including opioid prescriptions. Patient is not currently taking opioid prescriptions. Functional ability and status Nutritional status Physical activity Advanced directives List of other physicians Hospitalizations, surgeries, and ER visits in previous 12 months Vitals Screenings to include cognitive, depression, and falls Referrals and appointments  In addition, I have reviewed and discussed with patient certain  preventive protocols, quality metrics, and best practice recommendations. A written personalized care plan for preventive services as well as general preventive health recommendations were provided to patient.   Due to this being a telephonic visit, the after visit summary with patients personalized plan was offered to patient via mail or my-chart. Per request, patient was mailed a copy of AVS.  Beatris Ship, [place]   07/24/2022   Nurse Notes: None

## 2022-07-24 NOTE — Patient Instructions (Signed)
[name] , Thank you for taking time to come for your Medicare Wellness Visit. I appreciate your ongoing commitment to your health goals. Please review the following plan we discussed and let me know if I can assist you in the future.   These are the goals we discussed:  Goals      DIET - INCREASE WATER INTAKE        This is a list of the screening recommended for you and due dates:  Health Maintenance  Topic Date Due   Zoster (Shingles) Vaccine (1 of 2) Never done   COVID-19 Vaccine (3 - Pfizer risk series) 01/13/2020   Colon Cancer Screening  10/09/2021   Tetanus Vaccine  10/09/2021   Flu Shot  05/08/2022   DEXA scan (bone density measurement)  07/13/2022   Mammogram  07/25/2022   Pneumonia Vaccine  Completed   Hepatitis C Screening: USPSTF Recommendation to screen - Ages 18-79 yo.  Completed   HPV Vaccine  Aged Out     Next appointment: Follow up in one year for your annual wellness visit.   Preventive Care 44 Years and Older, Female Preventive care refers to lifestyle choices and visits with your health care provider that can promote health and wellness. What does preventive care include? A yearly physical exam. This is also called an annual well check. Dental exams once or twice a year. Routine eye exams. Ask your health care provider how often you should have your eyes checked. Personal lifestyle choices, including: Daily care of your teeth and gums. Regular physical activity. Eating a healthy diet. Avoiding tobacco and drug use. Limiting alcohol use. Practicing safe sex. Taking low-dose aspirin every day. Taking vitamin and mineral supplements as recommended by your health care provider. What happens during an annual well check? The services and screenings done by your health care provider during your annual well check will depend on your age, overall health, lifestyle risk factors, and family history of disease. Counseling  Your health care provider may ask  you questions about your: Alcohol use. Tobacco use. Drug use. Emotional well-being. Home and relationship well-being. Sexual activity. Eating habits. History of falls. Memory and ability to understand (cognition). Work and work Statistician. Reproductive health. Screening  You may have the following tests or measurements: Height, weight, and BMI. Blood pressure. Lipid and cholesterol levels. These may be checked every 5 years, or more frequently if you are over 82 years old. Skin check. Lung cancer screening. You may have this screening every year starting at age 44 if you have a 30-pack-year history of smoking and currently smoke or have quit within the past 15 years. Fecal occult blood test (FOBT) of the stool. You may have this test every year starting at age 54. Flexible sigmoidoscopy or colonoscopy. You may have a sigmoidoscopy every 5 years or a colonoscopy every 10 years starting at age 59. Hepatitis C blood test. Hepatitis B blood test. Sexually transmitted disease (STD) testing. Diabetes screening. This is done by checking your blood sugar (glucose) after you have not eaten for a while (fasting). You may have this done every 1-3 years. Bone density scan. This is done to screen for osteoporosis. You may have this done starting at age 14. Mammogram. This may be done every 1-2 years. Talk to your health care provider about how often you should have regular mammograms. Talk with your health care provider about your test results, treatment options, and if necessary, the need for more tests. Vaccines  Your  health care provider may recommend certain vaccines, such as: Influenza vaccine. This is recommended every year. Tetanus, diphtheria, and acellular pertussis (Tdap, Td) vaccine. You may need a Td booster every 10 years. Zoster vaccine. You may need this after age 72. Pneumococcal 13-valent conjugate (PCV13) vaccine. One dose is recommended after age 53. Pneumococcal  polysaccharide (PPSV23) vaccine. One dose is recommended after age 4. Talk to your health care provider about which screenings and vaccines you need and how often you need them. This information is not intended to replace advice given to you by your health care provider. Make sure you discuss any questions you have with your health care provider. Document Released: 10/21/2015 Document Revised: 06/13/2016 Document Reviewed: 07/26/2015 Elsevier Interactive Patient Education  2017 [employer] Prevention in the Home Falls can cause injuries. They can happen to people of all ages. There are many things you can do to make your home safe and to help prevent falls. What can I do on the outside of my home? Regularly fix the edges of walkways and driveways and fix any cracks. Remove anything that might make you trip as you walk through a door, such as a raised step or threshold. Trim any bushes or trees on the path to your home. Use bright outdoor lighting. Clear any walking paths of anything that might make someone trip, such as rocks or tools. Regularly check to see if handrails are loose or broken. Make sure that both sides of any steps have handrails. Any raised decks and porches should have guardrails on the edges. Have any leaves, snow, or ice cleared regularly. Use sand or salt on walking paths during winter. Clean up any spills in your garage right away. This includes oil or grease spills. What can I do in the bathroom? Use night lights. Install grab bars by the toilet and in the tub and shower. Do not use towel bars as grab bars. Use non-skid mats or decals in the tub or shower. If you need to sit down in the shower, use a plastic, non-slip stool. Keep the floor dry. Clean up any water that spills on the floor as soon as it happens. Remove soap buildup in the tub or shower regularly. Attach bath mats securely with double-sided non-slip rug tape. Do not have throw rugs and other  things on the floor that can make you trip. What can I do in the bedroom? Use night lights. Make sure that you have a light by your bed that is easy to reach. Do not use any sheets or blankets that are too big for your bed. They should not hang down onto the floor. Have a firm chair that has side arms. You can use this for support while you get dressed. Do not have throw rugs and other things on the floor that can make you trip. What can I do in the kitchen? Clean up any spills right away. Avoid walking on wet floors. Keep items that you use a lot in easy-to-reach places. If you need to reach something above you, use a strong step stool that has a grab bar. Keep electrical cords out of the way. Do not use floor polish or wax that makes floors slippery. If you must use wax, use non-skid floor wax. Do not have throw rugs and other things on the floor that can make you trip. What can I do with my stairs? Do not leave any items on the stairs. Make sure that there are handrails  on both sides of the stairs and use them. Fix handrails that are broken or loose. Make sure that handrails are as long as the stairways. Check any carpeting to make sure that it is firmly attached to the stairs. Fix any carpet that is loose or worn. Avoid having throw rugs at the top or bottom of the stairs. If you do have throw rugs, attach them to the floor with carpet tape. Make sure that you have a light switch at the top of the stairs and the bottom of the stairs. If you do not have them, ask someone to add them for you. What else can I do to help prevent falls? Wear shoes that: Do not have high heels. Have rubber bottoms. Are comfortable and fit you well. Are closed at the toe. Do not wear sandals. If you use a stepladder: Make sure that it is fully opened. Do not climb a closed stepladder. Make sure that both sides of the stepladder are locked into place. Ask someone to hold it for you, if possible. Clearly  mark and make sure that you can see: Any grab bars or handrails. First and last steps. Where the edge of each step is. Use tools that help you move around (mobility aids) if they are needed. These include: Canes. Walkers. Scooters. Crutches. Turn on the lights when you go into a dark area. Replace any light bulbs as soon as they burn out. Set up your furniture so you have a clear path. Avoid moving your furniture around. If any of your floors are uneven, fix them. If there are any pets around you, be aware of where they are. Review your medicines with your doctor. Some medicines can make you feel dizzy. This can increase your chance of falling. Ask your doctor what other things that you can do to help prevent falls. This information is not intended to replace advice given to you by your health care provider. Make sure you discuss any questions you have with your health care provider. Document Released: 07/21/2009 Document Revised: 03/01/2016 Document Reviewed: 10/29/2014 Elsevier Interactive Patient Education  2017 [employer].

## 2022-07-26 DIAGNOSIS — Z1231 Encounter for screening mammogram for malignant neoplasm of breast: Secondary | ICD-10-CM | POA: Diagnosis not present

## 2022-07-26 LAB — HM MAMMOGRAPHY

## 2022-07-30 ENCOUNTER — Encounter: Payer: Self-pay | Admitting: *Deleted

## 2022-09-27 ENCOUNTER — Encounter: Payer: Self-pay | Admitting: Family Medicine

## 2022-09-27 ENCOUNTER — Ambulatory Visit (INDEPENDENT_AMBULATORY_CARE_PROVIDER_SITE_OTHER): Payer: Medicare Other | Admitting: Family Medicine

## 2022-09-27 VITALS — BP 132/80 | HR 73 | Temp 97.6°F | Resp 18 | Ht 60.0 in | Wt 169.4 lb

## 2022-09-27 DIAGNOSIS — I10 Essential (primary) hypertension: Secondary | ICD-10-CM

## 2022-09-27 DIAGNOSIS — F17200 Nicotine dependence, unspecified, uncomplicated: Secondary | ICD-10-CM

## 2022-09-27 DIAGNOSIS — J441 Chronic obstructive pulmonary disease with (acute) exacerbation: Secondary | ICD-10-CM | POA: Diagnosis not present

## 2022-09-27 DIAGNOSIS — Z0000 Encounter for general adult medical examination without abnormal findings: Secondary | ICD-10-CM | POA: Diagnosis not present

## 2022-09-27 DIAGNOSIS — C3432 Malignant neoplasm of lower lobe, left bronchus or lung: Secondary | ICD-10-CM

## 2022-09-27 DIAGNOSIS — Z23 Encounter for immunization: Secondary | ICD-10-CM | POA: Diagnosis not present

## 2022-09-27 DIAGNOSIS — Z85118 Personal history of other malignant neoplasm of bronchus and lung: Secondary | ICD-10-CM | POA: Insufficient documentation

## 2022-09-27 LAB — CBC WITH DIFFERENTIAL/PLATELET
Basophils Absolute: 0.1 10*3/uL (ref 0.0–0.1)
Basophils Relative: 1.1 % (ref 0.0–3.0)
Eosinophils Absolute: 0.1 10*3/uL (ref 0.0–0.7)
Eosinophils Relative: 2.4 % (ref 0.0–5.0)
HCT: 44.3 % (ref 36.0–46.0)
Hemoglobin: 15 g/dL (ref 12.0–15.0)
Lymphocytes Relative: 24.7 % (ref 12.0–46.0)
Lymphs Abs: 1.4 10*3/uL (ref 0.7–4.0)
MCHC: 34 g/dL (ref 30.0–36.0)
MCV: 95.4 fl (ref 78.0–100.0)
Monocytes Absolute: 0.6 10*3/uL (ref 0.1–1.0)
Monocytes Relative: 10.3 % (ref 3.0–12.0)
Neutro Abs: 3.4 10*3/uL (ref 1.4–7.7)
Neutrophils Relative %: 61.5 % (ref 43.0–77.0)
Platelets: 215 10*3/uL (ref 150.0–400.0)
RBC: 4.64 Mil/uL (ref 3.87–5.11)
RDW: 14.3 % (ref 11.5–15.5)
WBC: 5.6 10*3/uL (ref 4.0–10.5)

## 2022-09-27 LAB — COMPREHENSIVE METABOLIC PANEL
ALT: 15 U/L (ref 0–35)
AST: 19 U/L (ref 0–37)
Albumin: 4.3 g/dL (ref 3.5–5.2)
Alkaline Phosphatase: 63 U/L (ref 39–117)
BUN: 11 mg/dL (ref 6–23)
CO2: 28 mEq/L (ref 19–32)
Calcium: 9.4 mg/dL (ref 8.4–10.5)
Chloride: 100 mEq/L (ref 96–112)
Creatinine, Ser: 0.78 mg/dL (ref 0.40–1.20)
GFR: 74.14 mL/min (ref >60.00)
Glucose, Bld: 90 mg/dL (ref 70–99)
Potassium: 4.2 mEq/L (ref 3.5–5.1)
Sodium: 136 mEq/L (ref 135–145)
Total Bilirubin: 0.7 mg/dL (ref 0.2–1.2)
Total Protein: 7 g/dL (ref 6.0–8.3)

## 2022-09-27 LAB — LIPID PANEL
Cholesterol: 200 mg/dL (ref 0–200)
HDL: 70 mg/dL (ref >39.00)
LDL Cholesterol: 111 mg/dL — ABNORMAL HIGH (ref 0–99)
NonHDL: 130.3
Total CHOL/HDL Ratio: 3
Triglycerides: 98 mg/dL (ref 0.0–149.0)
VLDL: 19.6 mg/dL (ref 0.0–40.0)

## 2022-09-27 MED ORDER — FLUTICASONE-UMECLIDIN-VILANT 100-62.5-25 MCG/ACT IN AEPB: 1.0000 | INHALATION_SPRAY | Freq: Every day | RESPIRATORY_TRACT | 11 refills | Status: DC

## 2022-09-27 MED ORDER — METOPROLOL SUCCINATE ER 25 MG PO TB24: ORAL_TABLET | ORAL | 3 refills | Status: DC

## 2022-09-27 MED ORDER — AMLODIPINE BESYLATE 10 MG PO TABS: ORAL_TABLET | ORAL | 3 refills | Status: DC

## 2022-09-27 NOTE — Assessment & Plan Note (Signed)
Well controlled, no changes to meds. Encouraged heart healthy diet such as the DASH diet and exercise as tolerated.  °

## 2022-09-27 NOTE — Progress Notes (Addendum)
Subjective:   By signing my name below, I, Pam Cameron, attest that this documentation has been prepared under the direction and in the presence of [name], 09/27/2022.   Patient ID: Pam Cameron, female    DOB: 1945-11-13, 76 y.o.   MRN: [id number]  Chief Complaint  Patient presents with   Annual Exam    Pt states fasting     HPI Patient is in today for an office visit.  COPD Patient reports that she occasionally uses her Trelegy Ellipta inhaler. She is requesting a refill today.  Pulmonology Patient reports that she no longer sees her pulmonologist, Dr. Valeta Harms.  She denies new moles, itching, chills, fever, hearing loss, sinus pain, congestion, sore throat, cough and hemoptysis, chest pain, palpitations, wheezing, constipation, diarrhea, blood in stool, nausea and vomiting, dysuria, frequency, hematuria, myalgias and joint pain, depression, anxiety.  Social history- There are no new surgeries to report. Dexa last completed on 07/13/2020. Immunizations- Patient is receiving a pneumonia vaccine this visit. She is uninterested in receiving an influenza vaccine this visit. Pap smear- last completed on 03/24/2012. Mammogram-last completed on 07/26/2022. Exercise- She does not participate in regular exercise.   Health Maintenance Due  Topic Date Due   Zoster Vaccines- Shingrix (1 of 2) Never done   COVID-19 Vaccine (3 - Pfizer risk series) 01/13/2020   DTaP/Tdap/Td (2 - Td or Tdap) 10/09/2021   DEXA SCAN  07/13/2022    Past Medical History:  Diagnosis Date   Anemia    Blood transfusion    Cataracts, bilateral    CHF (congestive heart failure) (HCC)    COPD (chronic obstructive pulmonary disease) ([place])    Depression    Hypertension    left lung ca dx'd 08/2018   SBRT comp 01/20/19   Nodule of lower lobe of left lung 12/10/2018   Pneumonia     Past Surgical History:  Procedure Laterality Date   APPENDECTOMY     ESOPHAGOGASTRODUODENOSCOPY ENDOSCOPY      FIXATION KYPHOPLASTY LUMBAR SPINE  12/14/2011   KYPHOPLASTY N/A 08/10/2015   Procedure: Thoracic four and thoracic five kyphoplasty;  Surgeon: Kevan Ny Ditty, MD;  Location: [facility];  Service: Neurosurgery;  Laterality: N/A;  T4 and T5 kyphoplasties   LUNG SURGERY     fluid removed after pneumonia   TONSILLECTOMY     TUBAL LIGATION      Family History  Problem Relation Age of Onset   Ovarian cancer Mother    Hypertension Mother    Cancer Mother        uterine   Prostate cancer Father    Hypertension Father    Cancer Father        lung   Dementia Father    Diabetes Sister     Social History   Socioeconomic History   Marital status: Single    Spouse name: Not on file   Number of children: 0   Years of education: Not on file   Highest education level: Not on file  Occupational History   Occupation: Scientist, clinical (histocompatibility and immunogenetics): [employer]  Tobacco Use   Smoking status: Every Day    Packs/day: 0.75    Years: 54.00    Total pack years: 40.50    Types: Cigarettes    Start date: 12/09/1968   Smokeless tobacco: Never   Tobacco comments:    Smoking less than a pack a day, cutting down.  11/09/2021  Substance and Sexual Activity  Alcohol use: Yes    Alcohol/week: 14.0 standard drinks of alcohol    Types: 14 Standard drinks or equivalent per week    Comment: approx one drink per day   Drug use: No   Sexual activity: Yes    Partners: Male  Other Topics Concern   Not on file  Social History Narrative   Exercise--no   Social Determinants of Health   Financial Resource Strain: Low Risk  (07/19/2021)   Overall Financial Resource Strain (CARDIA)    Difficulty of Paying Living Expenses: Not hard at all  Food Insecurity: No Food Insecurity (07/19/2021)   Hunger Vital Sign    Worried About Running Out of Food in the Last Year: Never true    Ran Out of Food in the Last Year: Never true  Transportation Needs: No Transportation Needs (07/19/2021)   PRAPARE - Armed forces logistics/support/administrative officer (Medical): No    Lack of Transportation (Non-Medical): No  Physical Activity: Inactive (07/19/2021)   Exercise Vital Sign    Days of Exercise per Week: 0 days    Minutes of Exercise per Session: 0 min  Stress: No Stress Concern Present (07/19/2021)   [place]    Feeling of Stress : Not at all  Social Connections: Moderately Isolated (07/19/2021)   Social Connection and Isolation Panel [NHANES]    Frequency of Communication with Friends and Family: More than three times a week    Frequency of Social Gatherings with Friends and Family: More than three times a week    Attends Religious Services: More than 4 times per year    Active Member of [employer] or Organizations: No    Attends Archivist Meetings: Never    Marital Status: Divorced  Human resources officer Violence: Not At Risk (07/19/2021)   Humiliation, Afraid, Rape, and Kick questionnaire    Fear of Current or Ex-Partner: No    Emotionally Abused: No    Physically Abused: No    Sexually Abused: No    Outpatient Medications Prior to Visit  Medication Sig Dispense Refill   albuterol (VENTOLIN HFA) 108 (90 Base) MCG/ACT inhaler Inhale 1-2 puffs into the lungs every 6 (six) hours as needed. 8 g 2   Cholecalciferol (VITAMIN D) 50 MCG (2000 UT) CAPS Take 1 capsule by mouth daily.     flintstones complete (FLINTSTONES) 60 MG chewable tablet Chew 1 tablet by mouth daily.     multivitamin-lutein (OCUVITE-LUTEIN) CAPS capsule Take 1 capsule by mouth daily.     UNABLE TO FIND Med Name: pt reports taking calcium supplement, unsure of dose     amLODipine (NORVASC) 10 MG tablet 1 po qd 90 tablet 3   Fluticasone-Umeclidin-Vilant (TRELEGY ELLIPTA) 100-62.5-25 MCG/ACT AEPB Inhale 1 puff into the lungs daily. 60 each 11   metoprolol succinate (TOPROL-XL) 25 MG 24 hr tablet 1 po qd 90 tablet 3   No facility-administered medications prior to visit.     Allergies  Allergen Reactions   Codeine     Abdominal cramping    Review of Systems  Constitutional:  Negative for chills, fever and malaise/fatigue.  HENT:  Negative for congestion and hearing loss.   Eyes:  Negative for discharge.  Respiratory:  Negative for cough, sputum production and shortness of breath.   Cardiovascular:  Negative for chest pain, palpitations and leg swelling.  Gastrointestinal:  Negative for abdominal pain, blood in stool, constipation, diarrhea, heartburn, nausea and vomiting.  Genitourinary:  Negative for dysuria, frequency, hematuria and urgency.  Musculoskeletal:  Negative for back pain, falls and myalgias.  Skin:  Negative for rash.  Neurological:  Negative for dizziness, sensory change, loss of consciousness, weakness and headaches.  Endo/Heme/Allergies:  Negative for environmental allergies. Does not bruise/bleed easily.  Psychiatric/Behavioral:  Negative for depression and suicidal ideas. The patient is not nervous/anxious and does not have insomnia.        Objective:    Physical Exam Vitals and nursing note reviewed.  Constitutional:      General: She is not in acute distress.    Appearance: Normal appearance. She is well-developed. She is not ill-appearing.  HENT:     Head: Normocephalic and atraumatic.     Right Ear: External ear normal.     Left Ear: External ear normal.  Eyes:     Extraocular Movements: Extraocular movements intact.     Conjunctiva/sclera: Conjunctivae normal.     Pupils: Pupils are equal, round, and reactive to light.  Neck:     Thyroid: No thyromegaly.     Vascular: No carotid bruit or JVD.  Cardiovascular:     Rate and Rhythm: Normal rate and regular rhythm.     Heart sounds: Normal heart sounds. No murmur heard.    No gallop.  Pulmonary:     Effort: Pulmonary effort is normal. No respiratory distress.     Breath sounds: Normal breath sounds. No wheezing or rales.  Chest:     Chest wall: No tenderness.   Musculoskeletal:     Cervical back: Normal range of motion and neck supple.  Skin:    General: Skin is warm and dry.  Neurological:     Mental Status: She is alert and oriented to person, place, and time.  Psychiatric:        Judgment: Judgment normal.     BP 132/80 (BP Location: Left Arm, Patient Position: Sitting, Cuff Size: Normal)   Pulse 73   Temp 97.6 F (36.4 C) (Oral)   Resp 18   Ht 5' (1.524 m)   Wt 169 lb 6.4 oz (76.8 kg)   SpO2 96%   BMI 33.08 kg/m  Wt Readings from Last 3 Encounters:  09/27/22 169 lb 6.4 oz (76.8 kg)  11/09/21 168 lb (76.2 kg)  09/25/21 164 lb 6.4 oz (74.6 kg)       Assessment & Plan:   Problem List Items Addressed This Visit       Unprioritized   Smoker   Relevant Orders   Ambulatory Referral Lung Cancer Screening [facility]   Need for pneumococcal 20-valent conjugate vaccination   Relevant Orders   Pneumococcal conjugate vaccine 20-valent (Prevnar 20) (Completed)   Malignant neoplasm of bronchus of left lower lobe ([place])   Essential hypertension    Well controlled, no changes to meds. Encouraged heart healthy diet such as the DASH diet and exercise as tolerated.        Relevant Medications   amLODipine (NORVASC) 10 MG tablet   metoprolol succinate (TOPROL-XL) 25 MG 24 hr tablet   Other Relevant Orders   Lipid panel   Comprehensive metabolic panel   CBC with Differential/Platelet   Encounter for Medicare annual wellness exam - Primary   COPD exacerbation ([place])   Relevant Medications   Fluticasone-Umeclidin-Vilant (TRELEGY ELLIPTA) 100-62.5-25 MCG/ACT AEPB   Meds ordered this encounter  Medications   amLODipine (NORVASC) 10 MG tablet    Sig: 1 po qd    Dispense:  90 tablet  Refill:  3   metoprolol succinate (TOPROL-XL) 25 MG 24 hr tablet    Sig: 1 po qd    Dispense:  90 tablet    Refill:  3   Fluticasone-Umeclidin-Vilant (TRELEGY ELLIPTA) 100-62.5-25 MCG/ACT AEPB    Sig: Inhale 1 puff into the lungs daily.     Dispense:  60 each    Refill:  11    Needs to keep appt pending    Order Specific Question:   Lot Number?    Answer:   [id number]    Order Specific Question:   Expiration Date?    Answer:   04/06/2022    Order Specific Question:   Manufacturer?    Answer:   [employer] [[id number]]    Order Specific Question:   Quantity    Answer:   2    I, [name], personally preformed the services described in this documentation.  All medical record entries made by the scribe were at my direction and in my presence.  I have reviewed the chart and discharge instructions (if applicable) and agree that the record reflects my personal performance and is accurate and complete. 09/27/2022.   I,Verona Buck,acting as a Education administrator for [employer], DO.,have documented all relevant documentation on the behalf of Ann Held, DO,as directed by  Ann Held, DO while in the presence of Ann Held, DO.    Ann Held, DO

## 2022-10-02 ENCOUNTER — Encounter: Payer: Self-pay | Admitting: Family Medicine

## 2022-10-03 NOTE — Progress Notes (Signed)
Letter has been mailed. Done

## 2022-11-30 ENCOUNTER — Encounter: Payer: Self-pay | Admitting: Pulmonary Disease

## 2022-11-30 ENCOUNTER — Ambulatory Visit: Payer: Medicare Other | Admitting: Pulmonary Disease

## 2022-11-30 VITALS — BP 110/80 | HR 70 | Ht 60.0 in | Wt 173.0 lb

## 2022-11-30 DIAGNOSIS — J449 Chronic obstructive pulmonary disease, unspecified: Secondary | ICD-10-CM

## 2022-11-30 DIAGNOSIS — R911 Solitary pulmonary nodule: Secondary | ICD-10-CM

## 2022-11-30 DIAGNOSIS — F17210 Nicotine dependence, cigarettes, uncomplicated: Secondary | ICD-10-CM

## 2022-11-30 DIAGNOSIS — Z720 Tobacco use: Secondary | ICD-10-CM

## 2022-11-30 MED ORDER — BUPROPION HCL ER (SR) 150 MG PO TB12: 150.0000 mg | ORAL_TABLET | Freq: Two times a day (BID) | ORAL | 2 refills | Status: DC

## 2022-11-30 NOTE — Progress Notes (Signed)
Synopsis: Referred in March 2020 for abnormal lung cancer screening CT, abnormal PET scan by Carollee Herter, Alferd Apa, *  Subjective:   PATIENT ID: Pam Cameron GENDER: female DOB: October 01, 1946, MRN: [id number]  Chief Complaint  Patient presents with   Follow-up    F/up    This is a 77 year old female longtime smoker.  Currently smoking 1 pack/day.  She had recent pulmonary function tests which reveals severe COPD with an FEV1 postbronchodilator response of 0.88 L.  At baseline she has daily wheezing.  She is not on any inhalers.  She has never been on any medications to help support her breathing.  She has currently no plans to quit smoking.  She was enrolled in our lung cancer screening program.  There was an initial small left lower lobe nodule that was found in November 2019.  She had subsequent low-dose lung cancer screening follow-up which revealed enlargement of the left lower lobe nodule.  She ultimately had PET scan imaging completed that revealed low level PET uptake of 1.76 SUV in the small 8 mm lung nodule.  At this time she does have daily wheezing and cough but she is still continuing to smoke.  Otherwise her respiratory symptoms are at her baseline.  She denies fevers chills night sweats weight loss or nausea vomiting or diarrhea.  OV 04/28/2019: Patient is seen today in follow-up regarding her recent CT imaging.  Left lower lobe lung nodule has decreased in size.  She is very happy about this.  Unfortunately she is still smoking.  She is currently smoking 1 pack/day.  She likes the use of her Trelegy inhaler.  She would also like to continue to follow in our low-dose lung cancer screening program.  She does complain of ongoing lower extremity edema.  She had this last time she was seen in the office.  She does take amlodipine.  As well as a diuretic.  She states that she will follow-up with her primary care doctor regarding this.  I encouraged her to do so.  Today in the office we also  discussed smoking cessation.  She states that she has been thinking about it but.  Recently all of the stress that is going on has making smoking cessation very difficult for her.  She denies fevers chills night sweats nausea vomiting diarrhea or any chest pains.  OV 09/21/2020: Patient here today for follow-up for severe COPD.  And lung nodules.  Patient's last CT chest was in July 2021 with post radiation changes to the left lower lobe.  No additional suspicious nodules.  Patient underwent SBRT for a clinical stage I lung cancer, presumed due to no tissue diagnosis obtained.  She follows with Dr. Sondra Come to.  At this point we discussed reenrollment in lung cancer screening to start in July 2022.  Overall doing well today from respiratory standpoint no complaints.  Compliant with her current inhaler regimen.  She does need refills for Trelegy.  OV 11/30/2022: Here today for follow-up managing severe COPD.  Last seen in 2021.  She had a presumed clinical stage I lung cancer was treated empirically with SBRT.Patient last seen in our office on 11/09/2021 by TP, NP.  At the time was doing okay with management for COPD on Trelegy.  From respiratory standpoint she is doing well.  She has not been hospitalized or had any recent exacerbations requiring antibiotics or steroids since last office visit.  She did have a follow-up CT scan completed in February 2023  by primary care which had a new nodule.  She has not had follow-up on that since.       Past Medical History:  Diagnosis Date   Anemia    Blood transfusion    Cataracts, bilateral    CHF (congestive heart failure) (HCC)    COPD (chronic obstructive pulmonary disease) (HCC)    Depression    Hypertension    left lung ca dx'd 08/2018   SBRT comp 01/20/19   Nodule of lower lobe of left lung 12/10/2018   Pneumonia      Family History  Problem Relation Age of Onset   Ovarian cancer Mother    Hypertension Mother    Cancer Mother        uterine    Prostate cancer Father    Hypertension Father    Cancer Father        lung   Dementia Father    Diabetes Sister      Past Surgical History:  Procedure Laterality Date   APPENDECTOMY     ESOPHAGOGASTRODUODENOSCOPY ENDOSCOPY     FIXATION KYPHOPLASTY LUMBAR SPINE  12/14/2011   KYPHOPLASTY N/A 08/10/2015   Procedure: Thoracic four and thoracic five kyphoplasty;  Surgeon: Kevan Ny Ditty, MD;  Location: [facility];  Service: Neurosurgery;  Laterality: N/A;  T4 and T5 kyphoplasties   LUNG SURGERY     fluid removed after pneumonia   TONSILLECTOMY     TUBAL LIGATION      Social History   Socioeconomic History   Marital status: Single    Spouse name: Not on file   Number of children: 0   Years of education: Not on file   Highest education level: Not on file  Occupational History   Occupation: Scientist, clinical (histocompatibility and immunogenetics): [employer]  Tobacco Use   Smoking status: Every Day    Packs/day: 0.75    Years: 54.00    Total pack years: 40.50    Types: Cigarettes    Start date: 12/09/1968   Smokeless tobacco: Never   Tobacco comments:    Smokes 5 pack of cigarettes a week. 11/30/22 [place]  Substance and Sexual Activity   Alcohol use: Yes    Alcohol/week: 14.0 standard drinks of alcohol    Types: 14 Standard drinks or equivalent per week    Comment: approx one drink per day   Drug use: No   Sexual activity: Yes    Partners: Male  Other Topics Concern   Not on file  Social History Narrative   Exercise--no   Social Determinants of Health   Financial Resource Strain: Low Risk  (07/19/2021)   Overall Financial Resource Strain (CARDIA)    Difficulty of Paying Living Expenses: Not hard at all  Food Insecurity: No Food Insecurity (07/19/2021)   Hunger Vital Sign    Worried About Running Out of Food in the Last Year: Never true    Ran Out of Food in the Last Year: Never true  Transportation Needs: No Transportation Needs (07/19/2021)   PRAPARE - Radiographer, therapeutic (Medical): No    Lack of Transportation (Non-Medical): No  Physical Activity: Inactive (07/19/2021)   Exercise Vital Sign    Days of Exercise per Week: 0 days    Minutes of Exercise per Session: 0 min  Stress: No Stress Concern Present (07/19/2021)   [place]    Feeling of Stress : Not at all  Social  Connections: Moderately Isolated (07/19/2021)   Social Connection and Isolation Panel [NHANES]    Frequency of Communication with Friends and Family: More than three times a week    Frequency of Social Gatherings with Friends and Family: More than three times a week    Attends Religious Services: More than 4 times per year    Active Member of [employer] or Organizations: No    Attends Archivist Meetings: Never    Marital Status: Divorced  Human resources officer Violence: Not At Risk (07/19/2021)   Humiliation, Afraid, Rape, and Kick questionnaire    Fear of Current or Ex-Partner: No    Emotionally Abused: No    Physically Abused: No    Sexually Abused: No     Allergies  Allergen Reactions   Codeine     Abdominal cramping     Outpatient Medications Prior to Visit  Medication Sig Dispense Refill   albuterol (VENTOLIN HFA) 108 (90 Base) MCG/ACT inhaler Inhale 1-2 puffs into the lungs every 6 (six) hours as needed. 8 g 2   amLODipine (NORVASC) 10 MG tablet 1 po qd 90 tablet 3   Cholecalciferol (VITAMIN D) 50 MCG (2000 UT) CAPS Take 1 capsule by mouth daily.     flintstones complete (FLINTSTONES) 60 MG chewable tablet Chew 1 tablet by mouth daily.     Fluticasone-Umeclidin-Vilant (TRELEGY ELLIPTA) 100-62.5-25 MCG/ACT AEPB Inhale 1 puff into the lungs daily. 60 each 11   metoprolol succinate (TOPROL-XL) 25 MG 24 hr tablet 1 po qd 90 tablet 3   multivitamin-lutein (OCUVITE-LUTEIN) CAPS capsule Take 1 capsule by mouth daily.     UNABLE TO FIND Med Name: pt reports taking calcium supplement, unsure of dose      No facility-administered medications prior to visit.    Review of Systems  Constitutional:  Negative for chills, fever, malaise/fatigue and weight loss.  HENT:  Negative for hearing loss, sore throat and tinnitus.   Eyes:  Negative for blurred vision and double vision.  Respiratory:  Positive for shortness of breath. Negative for cough, hemoptysis, sputum production, wheezing and stridor.   Cardiovascular:  Negative for chest pain, palpitations, orthopnea, leg swelling and PND.  Gastrointestinal:  Negative for abdominal pain, constipation, diarrhea, heartburn, nausea and vomiting.  Genitourinary:  Negative for dysuria, hematuria and urgency.  Musculoskeletal:  Negative for joint pain and myalgias.  Skin:  Negative for itching and rash.  Neurological:  Negative for dizziness, tingling, weakness and headaches.  Endo/Heme/Allergies:  Negative for environmental allergies. Does not bruise/bleed easily.  Psychiatric/Behavioral:  Negative for depression. The patient is not nervous/anxious and does not have insomnia.   All other systems reviewed and are negative.   Objective:  Physical Exam Vitals reviewed.  Constitutional:      General: She is not in acute distress.    Appearance: She is well-developed.  HENT:     Head: Normocephalic and atraumatic.  Eyes:     General: No scleral icterus.    Conjunctiva/sclera: Conjunctivae normal.     Pupils: Pupils are equal, round, and reactive to light.  Neck:     Vascular: No JVD.     Trachea: No tracheal deviation.  Cardiovascular:     Rate and Rhythm: Normal rate and regular rhythm.     Heart sounds: Normal heart sounds. No murmur heard. Pulmonary:     Effort: Pulmonary effort is normal. No tachypnea, accessory muscle usage or respiratory distress.     Breath sounds: No stridor. No wheezing, rhonchi or rales.  Comments: Diminished breath sounds bilaterally Abdominal:     General: There is no distension.     Palpations: Abdomen is  soft.     Tenderness: There is no abdominal tenderness.  Musculoskeletal:        General: No tenderness.     Cervical back: Neck supple.  Lymphadenopathy:     Cervical: No cervical adenopathy.  Skin:    General: Skin is warm and dry.     Capillary Refill: Capillary refill takes less than 2 seconds.     Findings: No rash.  Neurological:     Mental Status: She is alert and oriented to person, place, and time.  Psychiatric:        Behavior: Behavior normal.      Vitals:   11/30/22 1043  BP: 110/80  Pulse: 70  SpO2: 94%  Weight: 173 lb (78.5 kg)  Height: 5' (1.524 m)   94% on RA BMI Readings from Last 3 Encounters:  11/30/22 33.79 kg/m  09/27/22 33.08 kg/m  11/09/21 32.81 kg/m   Wt Readings from Last 3 Encounters:  11/30/22 173 lb (78.5 kg)  09/27/22 169 lb 6.4 oz (76.8 kg)  11/09/21 168 lb (76.2 kg)     CBC    Component Value Date/Time   WBC 5.6 09/27/2022 1102   RBC 4.64 09/27/2022 1102   HGB 15.0 09/27/2022 1102   HCT 44.3 09/27/2022 1102   PLT 215.0 09/27/2022 1102   MCV 95.4 09/27/2022 1102   MCH 32.9 08/09/2015 0933   MCHC 34.0 09/27/2022 1102   RDW 14.3 09/27/2022 1102   LYMPHSABS 1.4 09/27/2022 1102   MONOABS 0.6 09/27/2022 1102   EOSABS 0.1 09/27/2022 1102   BASOSABS 0.1 09/27/2022 1102    Chest Imaging: 12/05/2018 nuclear medicine pet imaging: 8 mm left lower lobe lung nodule with SUV of 1.76.  Suspicious for small neoplasm.  Does have PET avid uptake and the lesion is less than 1 cm in size.  No additional foci of increased PET activity.  04/06/2019 CT chest: Left lower lobe lung nodule decreased in size as compared to previous. The patient's images have been independently reviewed by me.    CT scan of the chest July 2021: Postradiation changes to the left lower lobe no other concerning nodules.  Evidence of emphysema. The patient's images have been independently reviewed by me.    CT chest February 2023: Minimal focal nodular opacity  within the right middle lobe presumed inflammatory.  Evidence of emphysema. The patient's images have been independently reviewed by me.    Pulmonary Functions Testing Results:    Latest Ref Rng & Units 12/02/2018    3:07 PM  PFT Results  FVC-Pre L 1.29   FVC-Predicted Pre % 51   FVC-Post L 1.67   FVC-Predicted Post % 66   Pre FEV1/FVC % % 54   Post FEV1/FCV % % 53   FEV1-Pre L 0.70   FEV1-Predicted Pre % 37   FEV1-Post L 0.88   DLCO uncorrected ml/min/mmHg 10.55   DLCO UNC% % 62   DLVA Predicted % 73   TLC L 4.72   TLC % Predicted % 105   RV % Predicted % 169      FeNO: None  Pathology: None  Echocardiogram: None  Heart Catheterization: None    Assessment & Plan:     ICD-10-CM   1. Lung nodule  R91.1 CT Chest Wo Contrast    2. Moderate smoker (20 or less per day)  F17.210  3. Tobacco abuse  Z72.0     4. Chronic obstructive pulmonary disease, unspecified COPD type ([place])  J44.9        Discussion:  This is a 77 year old female, severe COPD baseline had a left upper lobe nodule that underwent empiric SBRT for presumed clinical stage Ia lung cancer.  She continues to smoke at this time.  Is on Trelegy for COPD management.  Plans: Continue COPD management with Trelegy Continue vitamin D supplementation Repeat CT scan of the chest as it has been greater than a year since her last CT. When to follow-up on a right middle lobe nodular opacity. Pending upon the results of the CT scan she can likely reenroll in our lung cancer screening program for annual follow-ups. Return to clinic in 1 year for COPD management.   Current Outpatient Medications:    albuterol (VENTOLIN HFA) 108 (90 Base) MCG/ACT inhaler, Inhale 1-2 puffs into the lungs every 6 (six) hours as needed., Disp: 8 g, Rfl: 2   amLODipine (NORVASC) 10 MG tablet, 1 po qd, Disp: 90 tablet, Rfl: 3   buPROPion (WELLBUTRIN SR) 150 MG 12 hr tablet, Take 1 tablet (150 mg total) by mouth 2 (two) times daily.  First 3 days take one tablet then increase to twice daily, Disp: 180 tablet, Rfl: 2   Cholecalciferol (VITAMIN D) 50 MCG (2000 UT) CAPS, Take 1 capsule by mouth daily., Disp: , Rfl:    flintstones complete (FLINTSTONES) 60 MG chewable tablet, Chew 1 tablet by mouth daily., Disp: , Rfl:    Fluticasone-Umeclidin-Vilant (TRELEGY ELLIPTA) 100-62.5-25 MCG/ACT AEPB, Inhale 1 puff into the lungs daily., Disp: 60 each, Rfl: 11   metoprolol succinate (TOPROL-XL) 25 MG 24 hr tablet, 1 po qd, Disp: 90 tablet, Rfl: 3   multivitamin-lutein (OCUVITE-LUTEIN) CAPS capsule, Take 1 capsule by mouth daily., Disp: , Rfl:    UNABLE TO FIND, Med Name: pt reports taking calcium supplement, unsure of dose, Disp: , Rfl:    Garner Nash, DO [facility] 11/30/2022 11:26 AM

## 2022-11-30 NOTE — Patient Instructions (Signed)
Thank you for visiting Dr. Valeta Harms at [facility]. Today we recommend the following:  Meds ordered this encounter  Medications   buPROPion (WELLBUTRIN SR) 150 MG 12 hr tablet    Sig: Take 1 tablet (150 mg total) by mouth 2 (two) times daily. First 3 days take one tablet then increase to twice daily    Dispense:  180 tablet    Refill:  2   Return in about 1 year (around 12/01/2023), or if symptoms worsen or fail to improve, for with APP or Dr. Valeta Harms.    Please do your part to reduce the spread of COVID-19.   You must quit smoking or vaping. This is the single most important thing that you can do to improve your lung health.   S = Set a quit date. T = Tell family, friends, and the people around you that you plan to quit. A = Anticipate or plan ahead for the tough times you'll face while quitting. R = Remove cigarettes and other tobacco products from your home, car, and work T = Talk to [place] about getting help to quit  If you need help feel free to reach out to our office, [place] [facility]: [phone], call 1-800-QUIT-NOW, or visit [email or web address]

## 2022-12-18 ENCOUNTER — Encounter (HOSPITAL_COMMUNITY): Payer: Self-pay

## 2022-12-18 ENCOUNTER — Ambulatory Visit (HOSPITAL_COMMUNITY)
Admission: RE | Admit: 2022-12-18 | Discharge: 2022-12-18 | Disposition: A | Discharge status: Home / Self Care | Payer: Medicare Other | Source: Ambulatory Visit | Attending: Pulmonary Disease | Admitting: Pulmonary Disease

## 2022-12-18 DIAGNOSIS — Z85118 Personal history of other malignant neoplasm of bronchus and lung: Secondary | ICD-10-CM | POA: Insufficient documentation

## 2022-12-18 DIAGNOSIS — Z923 Personal history of irradiation: Secondary | ICD-10-CM | POA: Insufficient documentation

## 2022-12-18 DIAGNOSIS — I700 Atherosclerosis of aorta: Secondary | ICD-10-CM | POA: Diagnosis not present

## 2022-12-18 DIAGNOSIS — J439 Emphysema, unspecified: Secondary | ICD-10-CM | POA: Insufficient documentation

## 2022-12-18 DIAGNOSIS — R918 Other nonspecific abnormal finding of lung field: Secondary | ICD-10-CM | POA: Diagnosis not present

## 2022-12-18 DIAGNOSIS — R911 Solitary pulmonary nodule: Secondary | ICD-10-CM

## 2022-12-28 ENCOUNTER — Telehealth: Payer: Self-pay

## 2022-12-28 NOTE — Progress Notes (Signed)
Pam Cameron, please let patient know I have reviewed her CT imaging.  The CT is stable.  She needs a referral to the lung cancer screening program to start ambulatory lung cancer screening low-dose CTs in March 2025.  Thanks,  BLI  Garner Nash, DO [facility] 12/28/2022 3:40 PM

## 2022-12-28 NOTE — Telephone Encounter (Signed)
Called pt about CT results pt verbalized understanding, nothing further needed.

## 2023-02-06 ENCOUNTER — Other Ambulatory Visit: Payer: Self-pay | Admitting: Adult Health

## 2023-02-06 DIAGNOSIS — J441 Chronic obstructive pulmonary disease with (acute) exacerbation: Secondary | ICD-10-CM

## 2023-07-29 ENCOUNTER — Telehealth: Payer: Self-pay | Admitting: Family Medicine

## 2023-07-29 NOTE — Telephone Encounter (Signed)
Copied from CRM [id number]. Topic: Medicare AWV >> Jul 29, 2023 10:33 AM Payton Doughty wrote: Reason for CRM: Called LVM 07/30/2023 to schedule Annual Wellness Visit  Verlee Rossetti; Care Guide Ambulatory Clinical Support Paisley l [facility] Direct Dial: [phone]

## 2023-07-30 DIAGNOSIS — H04123 Dry eye syndrome of bilateral lacrimal glands: Secondary | ICD-10-CM | POA: Diagnosis not present

## 2023-07-30 DIAGNOSIS — H26491 Other secondary cataract, right eye: Secondary | ICD-10-CM | POA: Diagnosis not present

## 2023-07-30 DIAGNOSIS — H353132 Nonexudative age-related macular degeneration, bilateral, intermediate dry stage: Secondary | ICD-10-CM | POA: Diagnosis not present

## 2023-07-30 DIAGNOSIS — H43821 Vitreomacular adhesion, right eye: Secondary | ICD-10-CM | POA: Diagnosis not present

## 2023-08-01 ENCOUNTER — Encounter: Payer: Self-pay | Admitting: Family Medicine

## 2023-08-01 DIAGNOSIS — Z1231 Encounter for screening mammogram for malignant neoplasm of breast: Secondary | ICD-10-CM | POA: Diagnosis not present

## 2023-08-01 DIAGNOSIS — Z8262 Family history of osteoporosis: Secondary | ICD-10-CM | POA: Diagnosis not present

## 2023-08-01 DIAGNOSIS — M8588 Other specified disorders of bone density and structure, other site: Secondary | ICD-10-CM | POA: Diagnosis not present

## 2023-08-01 DIAGNOSIS — J449 Chronic obstructive pulmonary disease, unspecified: Secondary | ICD-10-CM | POA: Diagnosis not present

## 2023-08-01 LAB — HM DEXA SCAN

## 2023-08-01 LAB — HM MAMMOGRAPHY

## 2023-08-02 ENCOUNTER — Encounter: Payer: Self-pay | Admitting: Family Medicine

## 2023-09-11 ENCOUNTER — Other Ambulatory Visit: Payer: Self-pay | Admitting: Family Medicine

## 2023-09-11 DIAGNOSIS — I10 Essential (primary) hypertension: Secondary | ICD-10-CM

## 2023-09-30 ENCOUNTER — Encounter: Payer: Medicare Other | Admitting: Family Medicine

## 2023-10-10 ENCOUNTER — Ambulatory Visit: Payer: Medicare Other | Admitting: Family Medicine

## 2023-10-10 ENCOUNTER — Encounter: Payer: Self-pay | Admitting: Family Medicine

## 2023-10-10 ENCOUNTER — Other Ambulatory Visit (HOSPITAL_BASED_OUTPATIENT_CLINIC_OR_DEPARTMENT_OTHER): Payer: Self-pay

## 2023-10-10 VITALS — BP 140/80 | HR 77 | Temp 97.6°F | Resp 20 | Ht 60.0 in | Wt 167.2 lb

## 2023-10-10 DIAGNOSIS — Z0000 Encounter for general adult medical examination without abnormal findings: Secondary | ICD-10-CM

## 2023-10-10 DIAGNOSIS — C3432 Malignant neoplasm of lower lobe, left bronchus or lung: Secondary | ICD-10-CM

## 2023-10-10 DIAGNOSIS — E559 Vitamin D deficiency, unspecified: Secondary | ICD-10-CM

## 2023-10-10 DIAGNOSIS — I10 Essential (primary) hypertension: Secondary | ICD-10-CM

## 2023-10-10 LAB — CBC WITH DIFFERENTIAL/PLATELET
Basophils Absolute: 0.1 10*3/uL (ref 0.0–0.1)
Basophils Relative: 1.3 % (ref 0.0–3.0)
Eosinophils Absolute: 0.2 10*3/uL (ref 0.0–0.7)
Eosinophils Relative: 2.6 % (ref 0.0–5.0)
HCT: 44.4 % (ref 36.0–46.0)
Hemoglobin: 14.8 g/dL (ref 12.0–15.0)
Lymphocytes Relative: 23.5 % (ref 12.0–46.0)
Lymphs Abs: 1.4 10*3/uL (ref 0.7–4.0)
MCHC: 33.4 g/dL (ref 30.0–36.0)
MCV: 95.7 fL (ref 78.0–100.0)
Monocytes Absolute: 0.6 10*3/uL (ref 0.1–1.0)
Monocytes Relative: 9.8 % (ref 3.0–12.0)
Neutro Abs: 3.8 10*3/uL (ref 1.4–7.7)
Neutrophils Relative %: 62.8 % (ref 43.0–77.0)
Platelets: 208 10*3/uL (ref 150.0–400.0)
RBC: 4.64 Mil/uL (ref 3.87–5.11)
RDW: 14.2 % (ref 11.5–15.5)
WBC: 6.1 10*3/uL (ref 4.0–10.5)

## 2023-10-10 LAB — COMPREHENSIVE METABOLIC PANEL
ALT: 17 U/L (ref 0–35)
AST: 19 U/L (ref 0–37)
Albumin: 4.5 g/dL (ref 3.5–5.2)
Alkaline Phosphatase: 71 U/L (ref 39–117)
BUN: 14 mg/dL (ref 6–23)
CO2: 28 meq/L (ref 19–32)
Calcium: 10.4 mg/dL (ref 8.4–10.5)
Chloride: 101 meq/L (ref 96–112)
Creatinine, Ser: 0.69 mg/dL (ref 0.40–1.20)
GFR: 84.1 mL/min (ref >60.00)
Glucose, Bld: 91 mg/dL (ref 70–99)
Potassium: 4.5 meq/L (ref 3.5–5.1)
Sodium: 139 meq/L (ref 135–145)
Total Bilirubin: 0.6 mg/dL (ref 0.2–1.2)
Total Protein: 7.1 g/dL (ref 6.0–8.3)

## 2023-10-10 LAB — LIPID PANEL
Cholesterol: 238 mg/dL — ABNORMAL HIGH (ref 0–200)
HDL: 65.3 mg/dL (ref >39.00)
LDL Cholesterol: 141 mg/dL — ABNORMAL HIGH (ref 0–99)
NonHDL: 172.82
Total CHOL/HDL Ratio: 4
Triglycerides: 159 mg/dL — ABNORMAL HIGH (ref 0.0–149.0)
VLDL: 31.8 mg/dL (ref 0.0–40.0)

## 2023-10-10 LAB — VITAMIN D 25 HYDROXY (VIT D DEFICIENCY, FRACTURES): VITD: 42.23 ng/mL (ref 30.00–100.00)

## 2023-10-10 LAB — TSH: TSH: 1.97 u[IU]/mL (ref 0.35–5.50)

## 2023-10-10 MED ORDER — AREXVY 120 MCG/0.5ML IM SUSR
0.5000 mL | Freq: Once | INTRAMUSCULAR | 0 refills | Status: AC
  Filled 2023-10-10: qty 0.5, 1d supply, fill #0

## 2023-10-10 MED ORDER — AMLODIPINE BESYLATE 10 MG PO TABS: 10.0000 mg | ORAL_TABLET | Freq: Every day | ORAL | 3 refills | Status: DC

## 2023-10-10 MED ORDER — METOPROLOL SUCCINATE ER 25 MG PO TB24: 25.0000 mg | ORAL_TABLET | Freq: Every day | ORAL | 3 refills | Status: DC

## 2023-10-10 NOTE — Progress Notes (Signed)
 Established Patient Office Visit  Subjective   Patient ID: Pam Cameron, female    DOB: 07-21-1946  Age: 78 y.o. MRN: [id number]  Chief Complaint  Patient presents with   Annual Exam    Pt states not fasting     HPI Discussed the use of AI scribe software for clinical note transcription with the patient, who gave verbal consent to proceed.  History of Present Illness   The patient, with a history of hypertension and on amlodipine  and metoprolol , requested refills for these medications. She denied checking her blood pressure at home. She also reported taking vitamin D  and sulfonate for the ear. She denied taking Wellbutrin  (bupropion ) but later mentioned that it was prescribed by another doctor to aid in smoking cessation. The patient expressed concerns about potential side effects of bupropion , specifically fearing it might 'make her crazy.' However, she agreed to try it after reassurances from the doctor.  The patient admitted to smoking about a pack a day. She reported feeling good and energetic, with no new surgeries or significant changes in her health. She denied needing to use her inhalers frequently, only using Trelegy once a day and albuterol  as needed. She reported no issues with her stomach or joints. She denied regular exercise and expressed dissatisfaction with her weight, describing her stomach as 'fat.'  The patient also mentioned not having received a shingles vaccine or an RSV vaccine. She was unsure about getting these vaccines but seemed open to the idea after the doctor's recommendation. She reported regular visits to the eye doctor but admitted to avoiding the dentist.      Patient Active Problem List   Diagnosis Date Noted   Preventative health care 10/10/2023   Malignant neoplasm of bronchus of left lower lobe (HCC) 09/27/2022   Encounter for Medicare annual wellness exam 09/27/2022   Need for pneumococcal 20-valent conjugate vaccination 09/27/2022   Acute  sinusitis 09/20/2021   History of anemia 03/21/2020   Nodule of lower lobe of left lung 12/10/2018   Lower extremity edema 06/20/2017   Thoracic compression fracture (HCC) 08/10/2015   Acute upper respiratory infection 08/31/2014   Hyponatremia 05/07/2013   COPD exacerbation (HCC) 04/15/2013   Osteoporosis, postmenopausal 03/24/2012   Smoker 03/24/2012   Abnormal CT of the abdomen 10/10/2011   Female pattern hair loss 04/25/2011   Acute upper respiratory infection 11/28/2009   UTI 11/28/2009   Essential hypertension 10/11/2008   Backache 10/11/2008   Past Medical History:  Diagnosis Date   Anemia    Blood transfusion    Cataracts, bilateral    CHF (congestive heart failure) (HCC)    COPD (chronic obstructive pulmonary disease) (HCC)    Depression    Hypertension    left lung ca dx'd 08/2018   SBRT comp 01/20/19   Nodule of lower lobe of left lung 12/10/2018   Pneumonia    Past Surgical History:  Procedure Laterality Date   APPENDECTOMY     ESOPHAGOGASTRODUODENOSCOPY ENDOSCOPY     FIXATION KYPHOPLASTY LUMBAR SPINE  12/14/2011   KYPHOPLASTY N/A 08/10/2015   Procedure: Thoracic four and thoracic five kyphoplasty;  Surgeon: Morene Hicks Ditty, MD;  Location: [facility];  Service: Neurosurgery;  Laterality: N/A;  T4 and T5 kyphoplasties   LUNG SURGERY     fluid removed after pneumonia   TONSILLECTOMY     TUBAL LIGATION     Social History   Tobacco Use   Smoking status: Every Day    Current packs/day:  1.00    Average packs/day: 0.8 packs/day for 54.8 years (41.4 ttl pk-yrs)    Types: Cigarettes    Start date: 12/09/1968   Smokeless tobacco: Never   Tobacco comments:    Smokes 5 pack of cigarettes a week. 11/30/22 Tay  Substance Use Topics   Alcohol use: Yes    Alcohol/week: 14.0 standard drinks of alcohol    Types: 14 Standard drinks or equivalent per week    Comment: approx one drink per day   Drug use: No   Social History   Socioeconomic History   Marital  status: Single    Spouse name: Not on file   Number of children: 0   Years of education: Not on file   Highest education level: Not on file  Occupational History   Occupation: Investment Banker, Corporate: [employer]  Tobacco Use   Smoking status: Every Day    Current packs/day: 1.00    Average packs/day: 0.8 packs/day for 54.8 years (41.4 ttl pk-yrs)    Types: Cigarettes    Start date: 12/09/1968   Smokeless tobacco: Never   Tobacco comments:    Smokes 5 pack of cigarettes a week. 11/30/22 Tay  Substance and Sexual Activity   Alcohol use: Yes    Alcohol/week: 14.0 standard drinks of alcohol    Types: 14 Standard drinks or equivalent per week    Comment: approx one drink per day   Drug use: No   Sexual activity: Yes    Partners: Male  Other Topics Concern   Not on file  Social History Narrative   Exercise--no   Social Drivers of Health   Financial Resource Strain: Low Risk  (07/19/2021)   Overall Financial Resource Strain (CARDIA)    Difficulty of Paying Living Expenses: Not hard at all  Food Insecurity: No Food Insecurity (07/19/2021)   Hunger Vital Sign    Worried About Running Out of Food in the Last Year: Never true    Ran Out of Food in the Last Year: Never true  Transportation Needs: No Transportation Needs (07/19/2021)   PRAPARE - Administrator, Civil Service (Medical): No    Lack of Transportation (Non-Medical): No  Physical Activity: Inactive (07/19/2021)   Exercise Vital Sign    Days of Exercise per Week: 0 days    Minutes of Exercise per Session: 0 min  Stress: No Stress Concern Present (07/19/2021)   [employer] of Occupational Health - Occupational Stress Questionnaire    Feeling of Stress : Not at all  Social Connections: Moderately Isolated (07/19/2021)   Social Connection and Isolation Panel [NHANES]    Frequency of Communication with Friends and Family: More than three times a week    Frequency of Social Gatherings with Friends and  Family: More than three times a week    Attends Religious Services: More than 4 times per year    Active Member of [employer] or Organizations: No    Attends Banker Meetings: Never    Marital Status: Divorced  Catering Manager Violence: Not At Risk (07/19/2021)   Humiliation, Afraid, Rape, and Kick questionnaire    Fear of Current or Ex-Partner: No    Emotionally Abused: No    Physically Abused: No    Sexually Abused: No   Family Status  Relation Name Status   Mother  Deceased   Father  Deceased   Sister  Alive   Brother  Alive   Brother  Alive   Sister  Alive  No partnership data on file   Family History  Problem Relation Age of Onset   Ovarian cancer Mother    Hypertension Mother    Cancer Mother        uterine   Prostate cancer Father    Hypertension Father    Cancer Father        lung   Dementia Father    Diabetes Sister    Allergies  Allergen Reactions   Codeine     Abdominal cramping      Review of Systems  Constitutional:  Negative for fever.  HENT:  Negative for congestion.   Eyes:  Negative for blurred vision.  Respiratory:  Negative for cough.   Cardiovascular:  Negative for chest pain and palpitations.  Gastrointestinal:  Negative for vomiting.  Musculoskeletal:  Negative for back pain.  Skin:  Negative for rash.  Neurological:  Negative for loss of consciousness and headaches.      Objective:     BP (!) 140/80 (BP Location: Left Arm, Patient Position: Sitting, Cuff Size: Large)   Pulse 77   Temp 97.6 F (36.4 C) (Oral)   Resp 20   Ht 5' (1.524 m)   Wt 167 lb 3.2 oz (75.8 kg)   SpO2 96%   BMI 32.65 kg/m  BP Readings from Last 3 Encounters:  10/10/23 (!) 140/80  11/30/22 110/80  09/27/22 132/80   Wt Readings from Last 3 Encounters:  10/10/23 167 lb 3.2 oz (75.8 kg)  11/30/22 173 lb (78.5 kg)  09/27/22 169 lb 6.4 oz (76.8 kg)   SpO2 Readings from Last 3 Encounters:  10/10/23 96%  11/30/22 94%  09/27/22 96%       Physical Exam Vitals and nursing note reviewed.  Constitutional:      General: She is not in acute distress.    Appearance: Normal appearance. She is well-developed.  HENT:     Head: Normocephalic and atraumatic.  Eyes:     General: No scleral icterus.       Right eye: No discharge.        Left eye: No discharge.  Cardiovascular:     Rate and Rhythm: Normal rate and regular rhythm.     Heart sounds: No murmur heard. Pulmonary:     Effort: Pulmonary effort is normal. No respiratory distress.     Breath sounds: Normal breath sounds.  Musculoskeletal:        General: Normal range of motion.     Cervical back: Normal range of motion and neck supple.     Right lower leg: No edema.     Left lower leg: No edema.  Skin:    General: Skin is warm and dry.  Neurological:     Mental Status: She is alert and oriented to person, place, and time.  Psychiatric:        Mood and Affect: Mood normal.        Behavior: Behavior normal.        Thought Content: Thought content normal.        Judgment: Judgment normal.      No results found for any visits on 10/10/23.  Last CBC Lab Results  Component Value Date   WBC 5.6 09/27/2022   HGB 15.0 09/27/2022   HCT 44.3 09/27/2022   MCV 95.4 09/27/2022   MCH 32.9 08/09/2015   RDW 14.3 09/27/2022   PLT 215.0 09/27/2022   Last metabolic panel Lab Results  Component Value Date  GLUCOSE 90 09/27/2022   NA 136 09/27/2022   K 4.2 09/27/2022   CL 100 09/27/2022   CO2 28 09/27/2022   BUN 11 09/27/2022   CREATININE 0.78 09/27/2022   GFR 74.14 09/27/2022   CALCIUM 9.4 09/27/2022   PROT 7.0 09/27/2022   ALBUMIN 4.3 09/27/2022   BILITOT 0.7 09/27/2022   ALKPHOS 63 09/27/2022   AST 19 09/27/2022   ALT 15 09/27/2022   ANIONGAP 8 08/09/2015   Last lipids Lab Results  Component Value Date   CHOL 200 09/27/2022   HDL 70.00 09/27/2022   LDLCALC 111 (H) 09/27/2022   TRIG 98.0 09/27/2022   CHOLHDL 3 09/27/2022   Last hemoglobin  A1c No results found for: HGBA1C Last thyroid  functions Lab Results  Component Value Date   TSH 0.77 03/24/2012   Last vitamin D  Lab Results  Component Value Date   VD25OH 55.02 03/21/2020   Last vitamin B12 and Folate No results found for: VITAMINB12, FOLATE    The 10-year ASCVD risk score (Arnett DK, et al., 2019) is: 37.1%    Assessment & Plan:   Problem List Items Addressed This Visit       Unprioritized   Preventative health care - Primary   Ghm utd Check labs  See AVS Health Maintenance  Topic Date Due   Zoster Vaccines- Shingrix (1 of 2) 01/07/1966   COVID-19 Vaccine (3 - Pfizer risk series) 01/13/2020   DTaP/Tdap/Td (2 - Td or Tdap) 10/09/2021   Medicare Annual Wellness (AWV)  09/28/2023   INFLUENZA VACCINE  01/06/2024 (Originally 05/09/2023)   Lung Cancer Screening  12/18/2023   MAMMOGRAM  07/31/2024   DEXA SCAN  07/31/2025   Pneumonia Vaccine 47+ Years old  Completed   Hepatitis C Screening  Completed   HPV VACCINES  Aged Out   Colonoscopy  Discontinued         Malignant neoplasm of bronchus of left lower lobe (HCC)   Per pulm/ oncology      Essential hypertension   Well controlled, no changes to meds. Encouraged heart healthy diet such as the DASH diet and exercise as tolerated.        Relevant Medications   amLODipine  (NORVASC ) 10 MG tablet   metoprolol  succinate (TOPROL -XL) 25 MG 24 hr tablet   Other Relevant Orders   CBC with Differential/Platelet   Comprehensive metabolic panel   Lipid panel   TSH   VITAMIN D  25 Hydroxy (Vit-D Deficiency, Fractures)   Other Visit Diagnoses       Vitamin D  deficiency       Relevant Orders   VITAMIN D  25 Hydroxy (Vit-D Deficiency, Fractures)     Assessment and Plan    Hypertension Chronic hypertension is managed with amlodipine  and metoprolol , but she has not been monitoring her blood pressure at home recently. We discussed the importance of home monitoring to assess control outside the  clinical setting. We will refill amlodipine  and metoprolol  and encourage home blood pressure monitoring.  COPD She is managing her chronic obstructive pulmonary disease with Trelegy once daily and has not used albuterol  recently. We discussed the importance of continuing Trelegy and monitoring for any changes in breathing. She will continue Trelegy once daily and monitor for any changes in breathing.  Smoking Cessation She is a current smoker, approximately one pack per day, and was previously prescribed bupropion  for smoking cessation but expressed hesitancy due to concerns about side effects. We discussed the benefits of bupropion  for reducing cravings and its  use in psychiatric settings, explaining that bupropion  is different from Chantix and generally well-tolerated. She is advised to discontinue if adverse effects occur. We encourage a trial of bupropion  immediate release 150 mg and advise to discontinue if adverse effects occur.  General Health Maintenance She has not received shingles, tetanus, or flu vaccines. We discussed the importance of these vaccinations and the need to obtain them at the pharmacy due to Medicare Part D coverage. We explained that the shingles vaccine requires two doses given two to six months apart, and the RSV vaccine is currently a one-time dose. She is advised to get the shingles vaccine (two doses, two to six months apart), the RSV vaccine, and the tetanus and flu vaccines at the pharmacy.  Dental Care She does not regularly visit the dentist. We discussed the importance of regular dental check-ups for overall health. We encourage regular dental check-ups.        Return in about 1 year (around 10/09/2024) for fasting, annual exam.    [name] Antonio Cyndee, DO

## 2023-10-10 NOTE — Assessment & Plan Note (Signed)
 Ghm utd Check labs  See AVS Health Maintenance  Topic Date Due   Zoster Vaccines- Shingrix (1 of 2) 01/07/1966   COVID-19 Vaccine (3 - Pfizer risk series) 01/13/2020   DTaP/Tdap/Td (2 - Td or Tdap) 10/09/2021   Medicare Annual Wellness (AWV)  09/28/2023   INFLUENZA VACCINE  01/06/2024 (Originally 05/09/2023)   Lung Cancer Screening  12/18/2023   MAMMOGRAM  07/31/2024   DEXA SCAN  07/31/2025   Pneumonia Vaccine 84+ Years old  Completed   Hepatitis C Screening  Completed   HPV VACCINES  Aged Out   Colonoscopy  Discontinued

## 2023-10-10 NOTE — Patient Instructions (Signed)
 Preventive Care 83 Years and Older, Female Preventive care refers to lifestyle choices and visits with your health care provider that can promote health and wellness. Preventive care visits are also called wellness exams. What can I expect for my preventive care visit? Counseling Your health care provider may ask you questions about your: Medical history, including: Past medical problems. Family medical history. Pregnancy and menstrual history. History of falls. Current health, including: Memory and ability to understand (cognition). Emotional well-being. Home life and relationship well-being. Sexual activity and sexual health. Lifestyle, including: Alcohol, nicotine or tobacco, and drug use. Access to firearms. Diet, exercise, and sleep habits. Work and work Astronomer. Sunscreen use. Safety issues such as seatbelt and bike helmet use. Physical exam Your health care provider will check your: Height and weight. These may be used to calculate your BMI (body mass index). BMI is a measurement that tells if you are at a healthy weight. Waist circumference. This measures the distance around your waistline. This measurement also tells if you are at a healthy weight and may help predict your risk of certain diseases, such as type 2 diabetes and high blood pressure. Heart rate and blood pressure. Body temperature. Skin for abnormal spots. What immunizations do I need?  Vaccines are usually given at various ages, according to a schedule. Your health care provider will recommend vaccines for you based on your age, medical history, and lifestyle or other factors, such as travel or where you work. What tests do I need? Screening Your health care provider may recommend screening tests for certain conditions. This may include: Lipid and cholesterol levels. Hepatitis C test. Hepatitis B test. HIV (human immunodeficiency virus) test. STI (sexually transmitted infection) testing, if you are at  risk. Lung cancer screening. Colorectal cancer screening. Diabetes screening. This is done by checking your blood sugar (glucose) after you have not eaten for a while (fasting). Mammogram. Talk with your health care provider about how often you should have regular mammograms. BRCA-related cancer screening. This may be done if you have a family history of breast, ovarian, tubal, or peritoneal cancers. Bone density scan. This is done to screen for osteoporosis. Talk with your health care provider about your test results, treatment options, and if necessary, the need for more tests. Follow these instructions at home: Eating and drinking  Eat a diet that includes fresh fruits and vegetables, whole grains, lean protein, and low-fat dairy products. Limit your intake of foods with high amounts of sugar, saturated fats, and salt. Take vitamin and mineral supplements as recommended by your health care provider. Do not drink alcohol if your health care provider tells you not to drink. If you drink alcohol: Limit how much you have to 0-1 drink a day. Know how much alcohol is in your drink. In the [place] one drink equals one 12 oz bottle of beer (355 mL), one 5 oz glass of wine (148 mL), or one 1 oz glass of hard liquor (44 mL). Lifestyle Brush your teeth every morning and night with fluoride toothpaste. Floss one time each day. Exercise for at least 30 minutes 5 or more days each week. Do not use any products that contain nicotine or tobacco. These products include cigarettes, chewing tobacco, and vaping devices, such as e-cigarettes. If you need help quitting, ask your health care provider. Do not use drugs. If you are sexually active, practice safe sex. Use a condom or other form of protection in order to prevent STIs. Take aspirin only as told by  your health care provider. Make sure that you understand how much to take and what form to take. Work with your health care provider to find out whether it  is safe and beneficial for you to take aspirin daily. Ask your health care provider if you need to take a cholesterol-lowering medicine (statin). Find healthy ways to manage stress, such as: Meditation, yoga, or listening to music. Journaling. Talking to a trusted person. Spending time with friends and family. Minimize exposure to UV radiation to reduce your risk of skin cancer. Safety Always wear your seat belt while driving or riding in a vehicle. Do not drive: If you have been drinking alcohol. Do not ride with someone who has been drinking. When you are tired or distracted. While texting. If you have been using any mind-altering substances or drugs. Wear a helmet and other protective equipment during sports activities. If you have firearms in your house, make sure you follow all gun safety procedures. What's next? Visit your health care provider once a year for an annual wellness visit. Ask your health care provider how often you should have your eyes and teeth checked. Stay up to date on all vaccines. This information is not intended to replace advice given to you by your health care provider. Make sure you discuss any questions you have with your health care provider. Document Revised: 03/22/2021 Document Reviewed: 03/22/2021 Elsevier Patient Education  2024 [employer].

## 2023-10-10 NOTE — Assessment & Plan Note (Signed)
 Well controlled, no changes to meds. Encouraged heart healthy diet such as the DASH diet and exercise as tolerated.

## 2023-10-10 NOTE — Assessment & Plan Note (Signed)
 Per pulm/ oncology

## 2023-10-16 ENCOUNTER — Telehealth: Payer: Self-pay

## 2023-10-16 NOTE — Telephone Encounter (Signed)
 Copied from CRM [id number]. Topic: Clinical - Lab/Test Results >> Oct 16, 2023  1:47 PM Adaysia C wrote: Reason for CRM: Patient missed a call from the provider nurse Herbert Seta and was instructed to call back. Please follow up with patient [phone].

## 2023-10-17 NOTE — Telephone Encounter (Signed)
 Copied from CRM [id number]. Topic: Clinical - Lab/Test Results >> Oct 17, 2023  8:12 AM Irine Seal wrote: Reason for CRM: Pt returning missed called from Judieth Keens, [place]. Advised pt she could view results via mychart, she prefers callback [phone].

## 2023-10-18 NOTE — Telephone Encounter (Signed)
Spoke with patient. Relayed results. 

## 2023-11-11 ENCOUNTER — Ambulatory Visit: Payer: Medicare Other | Admitting: Pulmonary Disease

## 2023-11-11 ENCOUNTER — Encounter: Payer: Self-pay | Admitting: Acute Care

## 2023-11-11 ENCOUNTER — Ambulatory Visit: Payer: Medicare Other | Admitting: Acute Care

## 2023-11-11 ENCOUNTER — Other Ambulatory Visit: Payer: Self-pay

## 2023-11-11 VITALS — BP 132/82 | HR 68 | Temp 97.6°F | Ht 60.0 in | Wt 167.0 lb

## 2023-11-11 DIAGNOSIS — R911 Solitary pulmonary nodule: Secondary | ICD-10-CM

## 2023-11-11 DIAGNOSIS — F17210 Nicotine dependence, cigarettes, uncomplicated: Secondary | ICD-10-CM

## 2023-11-11 DIAGNOSIS — J449 Chronic obstructive pulmonary disease, unspecified: Secondary | ICD-10-CM

## 2023-11-11 DIAGNOSIS — F17200 Nicotine dependence, unspecified, uncomplicated: Secondary | ICD-10-CM | POA: Diagnosis not present

## 2023-11-11 DIAGNOSIS — Z87891 Personal history of nicotine dependence: Secondary | ICD-10-CM

## 2023-11-11 DIAGNOSIS — Z122 Encounter for screening for malignant neoplasm of respiratory organs: Secondary | ICD-10-CM

## 2023-11-11 DIAGNOSIS — J441 Chronic obstructive pulmonary disease with (acute) exacerbation: Secondary | ICD-10-CM

## 2023-11-11 MED ORDER — TRELEGY ELLIPTA 100-62.5-25 MCG/ACT IN AEPB: 1.0000 | INHALATION_SPRAY | Freq: Every day | RESPIRATORY_TRACT | 11 refills | Status: AC

## 2023-11-11 NOTE — Progress Notes (Signed)
History of Present Illness Pam Cameron is a 78 y.o. female current every day smoker Referred to Dr. Tonia Brooms in March 2020 for abnormal lung cancer screening CT, abnormal PET scan by Zola Button, [place],    Synopsis 78 year old female longtime smoker. Currently smoking 1 pack/day. She had recent pulmonary function tests which reveals severe COPD with an FEV1 postbronchodilator response of 0.88 L. At baseline she has daily wheezing. She is not on any inhalers. She has never been on any medications to help support her breathing. She has currently no plans to quit smoking. She was enrolled in our lung cancer screening program. There was an initial small left lower lobe nodule that was found in November 2019. She had subsequent low-dose lung cancer screening follow-up which revealed enlargement of the left lower lobe nodule. She ultimately had PET scan imaging completed that revealed low level PET uptake of 1.76 SUV in the small 8 mm lung nodule. At this time she does have daily wheezing and cough but she is still continuing to smoke.  She had SBRT to the nodule 01/2019, and she is currently being followed through the lung cancer screening program.  Family Hx includes  Cancer in her father and mother; Dementia in her father; Diabetes in her sister; Hypertension in her father and mother; Ovarian cancer in her mother; Prostate cancer in her father.   Social History includes an 80 pack year smoking history. She reports current alcohol use of about 14.0 standard drinks of alcohol per week. She reports that she does not use drugs.   11/11/2023 Annual Follow up COPD/ Lung Nodule Pt. Presents for follow up.She is a 78 year old current every day smoker with a history of pulmonary nodule and COPD who presents for routine follow-up. She was previously followed by Dr. Tonia Brooms for lung cancer screening. Diagnosed with clinical stage IA1 (T1a, N0, M0) cancer in 2019. She has had a stable interval, without  exacerbations.    The patient has a history of a pulmonary nodule discovered in November 2019 during lung cancer screening. Although a biopsy was not performed, radiation treatment was administered due to a presumption of cancer. She has been undergoing annual follow-up screenings, with the last CT scan conducted in March of the previous year, and is scheduled for another CT scan in March of this year.  She feels great with good energy levels and no breathing difficulties. She is currently using Trelegy, one puff daily, for her COPD and has not experienced any flares since her last visit. She does not use a rescue inhaler and has not had any issues with hemoptysis or significant weight loss, although she is attempting to lose weight. She reports clear secretions and no need for a rescue inhaler.  She continues to smoke a pack of cigarettes daily and has concerns about using Wellbutrin due to potential side effects. She has not recently tried other smoking cessation aids like patches or gum.  Test Results:     Latest Ref Rng & Units 10/10/2023   10:25 AM 09/27/2022   11:02 AM 09/25/2021   11:05 AM  CBC  WBC 4.0 - 10.5 K/uL 6.1  5.6  6.0   Hemoglobin 12.0 - 15.0 g/dL 84.1  32.4  40.1   Hematocrit 36.0 - 46.0 % 44.4  44.3  44.1   Platelets 150.0 - 400.0 K/uL 208.0  215.0  198.0        Latest Ref Rng & Units 10/10/2023   10:25 AM  09/27/2022   11:02 AM 09/25/2021   11:05 AM  BMP  Glucose 70 - 99 mg/dL 91  90  85   BUN 6 - 23 mg/dL 14  11  13    Creatinine 0.40 - 1.20 mg/dL 1.61  0.96  0.45   Sodium 135 - 145 mEq/L 139  136  133   Potassium 3.5 - 5.1 mEq/L 4.5  4.2  4.7   Chloride 96 - 112 mEq/L 101  100  97   CO2 19 - 32 mEq/L 28  28  27    Calcium 8.4 - 10.5 mg/dL 40.9  9.4  9.9     BNP No results found for: "BNP"  ProBNP No results found for: "PROBNP"  PFT    Component Value Date/Time   FEV1PRE 0.70 12/02/2018 1507   FEV1POST 0.88 12/02/2018 1507   FVCPRE 1.29 12/02/2018 1507    FVCPOST 1.67 12/02/2018 1507   TLC 4.72 12/02/2018 1507   DLCOUNC 10.55 12/02/2018 1507   PREFEV1FVCRT 54 12/02/2018 1507   PSTFEV1FVCRT 53 12/02/2018 1507    No results found.   Past medical hx Past Medical History:  Diagnosis Date   Anemia    Blood transfusion    Cataracts, bilateral    CHF (congestive heart failure) (HCC)    COPD (chronic obstructive pulmonary disease) (HCC)    Depression    Hypertension    left lung ca dx'd 08/2018   SBRT comp 01/20/19   Nodule of lower lobe of left lung 12/10/2018   Pneumonia      Social History   Tobacco Use   Smoking status: Every Day    Current packs/day: 1.00    Average packs/day: 0.8 packs/day for 54.9 years (41.5 ttl pk-yrs)    Types: Cigarettes    Start date: 12/09/1968   Smokeless tobacco: Never   Tobacco comments:    Smokes 5 pack of cigarettes a week. 11/30/22 Tay  Substance Use Topics   Alcohol use: Yes    Alcohol/week: 14.0 standard drinks of alcohol    Types: 14 Standard drinks or equivalent per week    Comment: approx one drink per day   Drug use: No    Ms.Samuelson reports that she has been smoking cigarettes. She started smoking about 54 years ago. She has a 41.5 pack-year smoking history. She has never used smokeless tobacco. She reports current alcohol use of about 14.0 standard drinks of alcohol per week. She reports that she does not use drugs.  Tobacco Cessation: Ready to quit: Not Answered Counseling given: Not Answered Tobacco comments: Smokes 5 pack of cigarettes a week. 11/30/22 Dellis Filbert   Past surgical hx, Family hx, Social hx all reviewed.  Current Outpatient Medications on File Prior to Visit  Medication Sig   amLODipine (NORVASC) 10 MG tablet Take 1 tablet (10 mg total) by mouth daily.   Cholecalciferol (VITAMIN D) 50 MCG (2000 UT) CAPS Take 1 capsule by mouth daily.   flintstones complete (FLINTSTONES) 60 MG chewable tablet Chew 1 tablet by mouth daily.   metoprolol succinate (TOPROL-XL) 25 MG 24 hr  tablet Take 1 tablet (25 mg total) by mouth daily.   UNABLE TO FIND Med Name: pt reports taking calcium supplement, unsure of dose   No current facility-administered medications on file prior to visit.     Allergies  Allergen Reactions   Codeine     Abdominal cramping    Review Of Systems:  Constitutional:   No  weight loss, night sweats,  Fevers,  chills, fatigue, or  lassitude.  HEENT:   No headaches,  Difficulty swallowing,  Tooth/dental problems, or  Sore throat,                No sneezing, itching, ear ache, nasal congestion, post nasal drip,   CV:  No chest pain,  Orthopnea, PND, swelling in lower extremities, anasarca, dizziness, palpitations, syncope.   GI  No heartburn, indigestion, abdominal pain, nausea, vomiting, diarrhea, change in bowel habits, loss of appetite, bloody stools.   Resp: No shortness of breath with exertion or at rest.  No excess mucus, no productive cough,  No non-productive cough,  No coughing up of blood.  No change in color of mucus.  No wheezing.  No chest wall deformity, + exp  wheezing bilateral lower lobes.  Skin: no rash or lesions.  GU: no dysuria, change in color of urine, no urgency or frequency.  No flank pain, no hematuria   MS:  No joint pain or swelling.  No decreased range of motion.  No back pain.  Psych:  No change in mood or affect. No depression or anxiety.  No memory loss.   Vital Signs BP 132/82 (BP Location: Left Arm, Patient Position: Sitting, Cuff Size: Normal)   Pulse 68   Temp 97.6 F (36.4 C) (Oral)   Ht 5' (1.524 m)   Wt 167 lb (75.8 kg)   SpO2 96%   BMI 32.61 kg/m    Physical Exam:  General- No distress,  A&Ox3, pleasant ENT: No sinus tenderness, TM clear, pale nasal mucosa, no oral exudate,no post nasal drip, no LAN Cardiac: S1, S2, regular rate and rhythm, no murmur Chest: + Exp  wheeze bilateral lower lobes, No/ rales/ dullness; no accessory muscle use, no nasal flaring, no sternal retractions Abd.: Soft  Non-tender, ND, BS +, Body mass index is 32.61 kg/m.  Ext: No clubbing cyanosis, edema Neuro:  normal strength, MAE x 4, A&O x 3 Skin: No rashes, warm and dry, Body mass index is 32.61 kg/m.  Psych: normal mood and behavior   Assessment/Plan COPD Lung Nodules Plan I am glad you have had a healthy interval since we last saw you. Continue Trelegy 1 puff once daily Rinse mouth after use I have renewed this for another year.  We will do a surveillance Ct chest 12/2023 as our annual surveillance of your lung nodules.  You will get a call to get this scheduled.  We will call you with the results. Note your daily symptoms > remember "red flags" for COPD:  Increase in cough, increase in sputum production, increase in shortness of breath or activity intolerance. If you notice these symptoms, please call to be seen.   Follow up in 1 year with Dr. Judeth Horn as your new pulmonary MD  You can receive free nicotine replacement therapy (patches, gum, or mints) by calling 1-800-QUIT NOW. Please call so we can get you on the path to becoming a non-smoker. I know it is hard, but you can do this!  Hypnosis for smoking cessation  [employer] [phone]  Acupuncture for smoking cessation  [employer] [phone]    I spent 20 minutes dedicated to the care of this patient on the date of this encounter to include pre-visit review of records, face-to-face time with the patient discussing conditions above, post visit ordering of testing, clinical documentation with the electronic health record, making appropriate referrals as documented, and communicating necessary information to the patient's healthcare team.  Bevelyn Ngo, NP 11/11/2023  10:14 AM

## 2023-11-11 NOTE — Patient Instructions (Addendum)
It is good to see you today. I am glad you have had a healthy interval since we last saw you. Continue Trelegy 1 puff once daily Rinse mouth after use I have renewed this for another year.  We will do a surveillance Ct chest 12/2023 as our annual surveillance of your lung nodules.  You will get a call to get this scheduled.  We will call you with the results. Note your daily symptoms > remember "red flags" for COPD:  Increase in cough, increase in sputum production, increase in shortness of breath or activity intolerance. If you notice these symptoms, please call to be seen.   Follow up in 1 year with Dr. Judeth Horn as your new pulmonary MD  You can receive free nicotine replacement therapy (patches, gum, or mints) by calling 1-800-QUIT NOW. Please call so we can get you on the path to becoming a non-smoker. I know it is hard, but you can do this!  Hypnosis for smoking cessation  [employer] [phone]  Acupuncture for smoking cessation  [employer] [phone]

## 2023-12-23 ENCOUNTER — Ambulatory Visit (HOSPITAL_COMMUNITY)
Admission: RE | Admit: 2023-12-23 | Discharge: 2023-12-23 | Disposition: A | Discharge status: Home / Self Care | Source: Ambulatory Visit | Attending: Acute Care | Admitting: Acute Care

## 2023-12-23 DIAGNOSIS — Z122 Encounter for screening for malignant neoplasm of respiratory organs: Secondary | ICD-10-CM | POA: Insufficient documentation

## 2023-12-23 DIAGNOSIS — Z87891 Personal history of nicotine dependence: Secondary | ICD-10-CM | POA: Insufficient documentation

## 2023-12-23 DIAGNOSIS — F17210 Nicotine dependence, cigarettes, uncomplicated: Secondary | ICD-10-CM | POA: Insufficient documentation

## 2024-01-17 ENCOUNTER — Other Ambulatory Visit: Payer: Self-pay | Admitting: Acute Care

## 2024-01-17 ENCOUNTER — Encounter (HOSPITAL_COMMUNITY): Payer: Self-pay | Admitting: Internal Medicine

## 2024-01-17 DIAGNOSIS — Z87891 Personal history of nicotine dependence: Secondary | ICD-10-CM

## 2024-01-17 DIAGNOSIS — Z122 Encounter for screening for malignant neoplasm of respiratory organs: Secondary | ICD-10-CM

## 2024-01-17 DIAGNOSIS — F17210 Nicotine dependence, cigarettes, uncomplicated: Secondary | ICD-10-CM

## 2024-01-30 DIAGNOSIS — H43821 Vitreomacular adhesion, right eye: Secondary | ICD-10-CM | POA: Diagnosis not present

## 2024-01-30 DIAGNOSIS — H353132 Nonexudative age-related macular degeneration, bilateral, intermediate dry stage: Secondary | ICD-10-CM | POA: Diagnosis not present

## 2024-01-30 DIAGNOSIS — H26491 Other secondary cataract, right eye: Secondary | ICD-10-CM | POA: Diagnosis not present

## 2024-02-26 ENCOUNTER — Telehealth: Payer: Self-pay | Admitting: Family Medicine

## 2024-02-26 NOTE — Telephone Encounter (Signed)
 Copied from CRM [id number]. Topic: Medicare AWV >> Feb 26, 2024 11:34 AM Juliana Ocean wrote: Reason for CRM: LVM 02/26/2024 to schedule AWV. Please schedule Virtual or Telehealth visits ONLY  Rosalee Collins; Care Guide Ambulatory Clinical Support [place] [facility] Direct Dial: [phone]

## 2024-05-06 ENCOUNTER — Ambulatory Visit (INDEPENDENT_AMBULATORY_CARE_PROVIDER_SITE_OTHER)

## 2024-05-06 VITALS — BP 132/82 | Ht 60.0 in | Wt 165.0 lb

## 2024-05-06 DIAGNOSIS — Z2821 Immunization not carried out because of patient refusal: Secondary | ICD-10-CM

## 2024-05-06 DIAGNOSIS — Z0000 Encounter for general adult medical examination without abnormal findings: Secondary | ICD-10-CM | POA: Diagnosis not present

## 2024-05-06 NOTE — Progress Notes (Signed)
 Because this visit was a virtual/telehealth visit,  certain criteria was not obtained, such a blood pressure, CBG if applicable, and timed get up and go. Any medications not marked as taking were not mentioned during the medication reconciliation part of the visit. Any vitals not documented were not able to be obtained due to this being a telehealth visit or patient was unable to self-report a recent blood pressure reading due to a lack of equipment at home via telehealth. Vitals that have been documented are verbally provided by the patient.  This visit was performed by a medical professional under my direct supervision. I was immediately available for consultation/collaboration. I have reviewed and agree with the Annual Wellness Visit documentation.  Subjective:   Pam Cameron is a 78 y.o. who presents for a Medicare Wellness preventive visit.  As a reminder, Annual Wellness Visits don't include a physical exam, and some assessments may be limited, especially if this visit is performed virtually. We may recommend an in-person follow-up visit with your provider if needed.  Visit Complete: Virtual I connected with  Pam Cameron on 05/06/24 by a audio enabled telemedicine application and verified that I am speaking with the correct person using two identifiers.  Patient Location: Home  Provider Location: Home Office  I discussed the limitations of evaluation and management by telemedicine. The patient expressed understanding and agreed to proceed.  Vital Signs: Because this visit was a virtual/telehealth visit, some criteria may be missing or patient reported. Any vitals not documented were not able to be obtained and vitals that have been documented are patient reported.  VideoDeclined- This patient declined Librarian, academic. Therefore the visit was completed with audio only.  Persons Participating in Visit: Patient.  AWV Questionnaire: No: Patient  Medicare AWV questionnaire was not completed prior to this visit.  Cardiac Risk Factors include: advanced age (>13men, >86 women);hypertension;obesity (BMI >30kg/m2)     Objective:    Today's Vitals   05/06/24 1406  BP: 132/82  Weight: 165 lb (74.8 kg)  Height: 5' (1.524 m)   Body mass index is 32.22 kg/m.     05/06/2024    2:06 PM 07/24/2022    8:22 AM 07/19/2021    8:23 AM 10/27/2020   10:44 AM 05/27/2020    9:32 AM 04/14/2020   11:48 AM 10/15/2019   10:08 AM  Advanced Directives  Does Patient Have a Medical Advance Directive? No No No No No No No  Would patient like information on creating a medical advance directive? No - Patient declined No - Patient declined Yes (MAU/Ambulatory/Procedural Areas - Information given) No - Patient declined No - Patient declined No - Patient declined     Current Medications (verified) Outpatient Encounter Medications as of 05/06/2024  Medication Sig   amLODipine  (NORVASC ) 10 MG tablet Take 1 tablet (10 mg total) by mouth daily.   Cholecalciferol (VITAMIN D ) 50 MCG (2000 UT) CAPS Take 1 capsule by mouth daily.   flintstones complete (FLINTSTONES) 60 MG chewable tablet Chew 1 tablet by mouth daily.   metoprolol  succinate (TOPROL -XL) 25 MG 24 hr tablet Take 1 tablet (25 mg total) by mouth daily.   UNABLE TO FIND Med Name: pt reports taking calcium supplement, unsure of dose   No facility-administered encounter medications on file as of 05/06/2024.    Allergies (verified) Codeine   History: Past Medical History:  Diagnosis Date   Anemia    Blood transfusion    Cataracts, bilateral  CHF (congestive heart failure) (HCC)    COPD (chronic obstructive pulmonary disease) (HCC)    Depression    Hypertension    left lung ca dx'd 08/2018   SBRT comp 01/20/19   Nodule of lower lobe of left lung 12/10/2018   Pneumonia    Past Surgical History:  Procedure Laterality Date   APPENDECTOMY     ESOPHAGOGASTRODUODENOSCOPY ENDOSCOPY     FIXATION  KYPHOPLASTY LUMBAR SPINE  12/14/2011   KYPHOPLASTY N/A 08/10/2015   Procedure: Thoracic four and thoracic five kyphoplasty;  Surgeon: Morene Hicks Ditty, MD;  Location: [facility];  Service: Neurosurgery;  Laterality: N/A;  T4 and T5 kyphoplasties   LUNG SURGERY     fluid removed after pneumonia   TONSILLECTOMY     TUBAL LIGATION     Family History  Problem Relation Age of Onset   Ovarian cancer Mother    Hypertension Mother    Cancer Mother        uterine   Prostate cancer Father    Hypertension Father    Cancer Father        lung   Dementia Father    Diabetes Sister    Social History   Socioeconomic History   Marital status: Single    Spouse name: Not on file   Number of children: 0   Years of education: Not on file   Highest education level: Not on file  Occupational History   Occupation: Investment banker, corporate: [employer]  Tobacco Use   Smoking status: Every Day    Current packs/day: 1.00    Average packs/day: 0.8 packs/day for 55.4 years (41.9 ttl pk-yrs)    Types: Cigarettes    Start date: 12/09/1968   Smokeless tobacco: Never   Tobacco comments:    Smokes 5 pack of cigarettes a week. 11/30/22 Tay  Substance and Sexual Activity   Alcohol use: Yes    Alcohol/week: 14.0 standard drinks of alcohol    Types: 14 Standard drinks or equivalent per week    Comment: approx one drink per day   Drug use: No   Sexual activity: Yes    Partners: Male  Other Topics Concern   Not on file  Social History Narrative   Exercise--no   Social Drivers of Health   Financial Resource Strain: Low Risk  (05/06/2024)   Overall Financial Resource Strain (CARDIA)    Difficulty of Paying Living Expenses: Not hard at all  Food Insecurity: No Food Insecurity (05/06/2024)   Hunger Vital Sign    Worried About Running Out of Food in the Last Year: Never true    Ran Out of Food in the Last Year: Never true  Transportation Needs: No Transportation Needs (05/06/2024)   PRAPARE -  Administrator, Civil Service (Medical): No    Lack of Transportation (Non-Medical): No  Physical Activity: Inactive (05/06/2024)   Exercise Vital Sign    Days of Exercise per Week: 0 days    Minutes of Exercise per Session: 0 min  Stress: No Stress Concern Present (05/06/2024)   [employer] of Occupational Health - Occupational Stress Questionnaire    Feeling of Stress: Not at all  Social Connections: Moderately Isolated (05/06/2024)   Social Connection and Isolation Panel    Frequency of Communication with Friends and Family: More than three times a week    Frequency of Social Gatherings with Friends and Family: More than three times a week    Attends Religious  Services: More than 4 times per year    Active Member of Clubs or Organizations: No    Attends Banker Meetings: Never    Marital Status: Divorced    Tobacco Counseling Ready to quit: Not Answered Counseling given: Not Answered Tobacco comments: Smokes 5 pack of cigarettes a week. 11/30/22 Tay    Clinical Intake:  Pre-visit preparation completed: Yes  Pain : No/denies pain     BMI - recorded: 32.22 Nutritional Status: BMI > 30  Obese Nutritional Risks: None Diabetes: No  No results found for: HGBA1C   How often do you need to have someone help you when you read instructions, pamphlets, or other written materials from your doctor or pharmacy?: 1 - Never  Interpreter Needed?: No  Information entered by :: Uchenna Rappaport,CMA   Activities of Daily Living     05/06/2024    2:08 PM  In your present state of health, do you have any difficulty performing the following activities:  Hearing? 0  Vision? 0  Difficulty concentrating or making decisions? 0  Walking or climbing stairs? 0  Dressing or bathing? 0  Doing errands, shopping? 0  Preparing Food and eating ? N  Using the Toilet? N  In the past six months, have you accidently leaked urine? Y  Do you have problems with loss  of bowel control? N  Managing your Medications? N  Managing your Finances? N  Housekeeping or managing your Housekeeping? N    Patient Care Team: Antonio Meth, Jamee SAUNDERS, DO as PCP - General Icard, Adine CROME, DO as Consulting Physician (Pulmonary Disease) Shannon Agent, MD as Consulting Physician (Radiation Oncology)  I have updated your Care Teams any recent Medical Services you may have received from other providers in the past year.     Assessment:   This is a routine wellness examination for Pam Cameron.  Hearing/Vision screen Hearing Screening - Comments:: No difficulties  Vision Screening - Comments:: Patient has had cataract surgeryh   Goals Addressed             This Visit's Progress    Patient Stated         Depression Screen     05/06/2024    2:12 PM 05/06/2024    2:09 PM 10/10/2023   10:00 AM 07/24/2022    8:23 AM 07/19/2021    8:26 AM 05/27/2020    9:35 AM 05/31/2016    3:14 PM  PHQ 2/9 Scores  PHQ - 2 Score 0 0 0 0 0 0 0  PHQ- 9 Score 0 0         Fall Risk     05/06/2024    2:07 PM 11/11/2023    9:52 AM 10/10/2023    9:59 AM 07/24/2022    8:22 AM 07/19/2021    8:25 AM  Fall Risk   Falls in the past year? 0 0 0 0 0  Number falls in past yr: 0 0 0 0 0  Injury with Fall? 0 0 0 0 0  Risk for fall due to : No Fall Risks   No Fall Risks   Follow up Falls evaluation completed  Falls evaluation completed Falls evaluation completed  Falls prevention discussed      Data saved with a previous flowsheet row definition    MEDICARE RISK AT HOME:  Medicare Risk at Home Any stairs in or around the home?: Yes If so, are there any without handrails?: No Home free of loose throw rugs  in walkways, pet beds, electrical cords, etc?: Yes Adequate lighting in your home to reduce risk of falls?: Yes Life alert?: Yes Use of a cane, walker or w/c?: No Grab bars in the bathroom?: No Shower chair or bench in shower?: No Elevated toilet seat or a handicapped toilet?:  No  TIMED UP AND GO:  Was the test performed?  No  Cognitive Function: 6CIT completed    05/31/2016    3:42 PM  MMSE - Mini Mental State Exam  Orientation to time 5   Orientation to Place 5   Registration 3   Attention/ Calculation 5   Recall 3   Language- name 2 objects 2   Language- repeat 1  Language- follow 3 step command 3   Language- read & follow direction 1   Write a sentence 1   Copy design 1   Total score 30      Data saved with a previous flowsheet row definition        05/06/2024    2:10 PM 07/24/2022    8:26 AM  6CIT Screen  What Year? 0 points 0 points  What month? 0 points 0 points  What time? 0 points 0 points  Count back from 20 0 points 0 points  Months in reverse 0 points 0 points  Repeat phrase 0 points 0 points  Total Score 0 points 0 points    Immunizations Immunization History  Administered Date(s) Administered   Fluad Quad(high Dose 65+) 07/01/2019   PFIZER(Purple Top)SARS-COV-2 Vaccination 11/21/2019, 12/16/2019   PNEUMOCOCCAL CONJUGATE-20 09/27/2022   Pneumococcal Conjugate-13 06/30/2015   Pneumococcal Polysaccharide-23 03/24/2012, 06/20/2017   Respiratory Syncytial Virus Vaccine ,Recomb Aduvanted(Arexvy ) 10/10/2023   Tdap 10/10/2011   Zoster, Live 03/24/2012    Screening Tests Health Maintenance  Topic Date Due   Zoster Vaccines- Shingrix (1 of 2) 01/07/1966   COVID-19 Vaccine (3 - Pfizer risk series) 01/13/2020   DTaP/Tdap/Td (2 - Td or Tdap) 10/09/2021   INFLUENZA VACCINE  05/08/2024   MAMMOGRAM  07/31/2024   Lung Cancer Screening  12/22/2024   Medicare Annual Wellness (AWV)  05/06/2025   DEXA SCAN  07/31/2025   Pneumococcal Vaccine: 50+ Years  Completed   Hepatitis C Screening  Completed   Hepatitis B Vaccines  Aged Out   HPV VACCINES  Aged Out   Meningococcal B Vaccine  Aged Out   Colonoscopy  Discontinued    Health Maintenance  Health Maintenance Due  Topic Date Due   Zoster Vaccines- Shingrix (1 of 2)  01/07/1966   COVID-19 Vaccine (3 - Pfizer risk series) 01/13/2020   DTaP/Tdap/Td (2 - Td or Tdap) 10/09/2021   Health Maintenance Items Addressed:patient declined health maintenance   Additional Screening:  Vision Screening: Recommended annual ophthalmology exams for early detection of glaucoma and other disorders of the eye. Would you like a referral to an eye doctor? No    Dental Screening: Recommended annual dental exams for proper oral hygiene  Community Resource Referral / Chronic Care Management: CRR required this visit?  No   CCM required this visit?  No   Plan:    I have personally reviewed and noted the following in the patient's chart:   Medical and social history Use of alcohol, tobacco or illicit drugs  Current medications and supplements including opioid prescriptions. Patient is not currently taking opioid prescriptions. Functional ability and status Nutritional status Physical activity Advanced directives List of other physicians Hospitalizations, surgeries, and ER visits in previous 12 months Vitals Screenings to  include cognitive, depression, and falls Referrals and appointments  In addition, I have reviewed and discussed with patient certain preventive protocols, quality metrics, and best practice recommendations. A written personalized care plan for preventive services as well as general preventive health recommendations were provided to patient.   Lyle MARLA Right, [place]   05/06/2024   After Visit Summary: (MyChart) Due to this being a telephonic visit, the after visit summary with patients personalized plan was offered to patient via MyChart   Notes: Nothing significant to report at this time.

## 2024-07-14 ENCOUNTER — Other Ambulatory Visit (HOSPITAL_BASED_OUTPATIENT_CLINIC_OR_DEPARTMENT_OTHER): Payer: Self-pay

## 2024-07-23 DIAGNOSIS — H43821 Vitreomacular adhesion, right eye: Secondary | ICD-10-CM | POA: Diagnosis not present

## 2024-07-23 DIAGNOSIS — H01005 Unspecified blepharitis left lower eyelid: Secondary | ICD-10-CM | POA: Diagnosis not present

## 2024-07-23 DIAGNOSIS — H04123 Dry eye syndrome of bilateral lacrimal glands: Secondary | ICD-10-CM | POA: Diagnosis not present

## 2024-07-23 DIAGNOSIS — H353132 Nonexudative age-related macular degeneration, bilateral, intermediate dry stage: Secondary | ICD-10-CM | POA: Diagnosis not present

## 2024-08-03 DIAGNOSIS — H35722 Serous detachment of retinal pigment epithelium, left eye: Secondary | ICD-10-CM | POA: Diagnosis not present

## 2024-08-03 DIAGNOSIS — H353132 Nonexudative age-related macular degeneration, bilateral, intermediate dry stage: Secondary | ICD-10-CM | POA: Diagnosis not present

## 2024-08-03 DIAGNOSIS — H35363 Drusen (degenerative) of macula, bilateral: Secondary | ICD-10-CM | POA: Diagnosis not present

## 2024-08-03 DIAGNOSIS — H43391 Other vitreous opacities, right eye: Secondary | ICD-10-CM | POA: Diagnosis not present

## 2024-08-06 DIAGNOSIS — Z1231 Encounter for screening mammogram for malignant neoplasm of breast: Secondary | ICD-10-CM | POA: Diagnosis not present

## 2024-08-06 LAB — HM MAMMOGRAPHY

## 2024-08-07 ENCOUNTER — Encounter: Payer: Self-pay | Admitting: Family Medicine

## 2024-08-12 ENCOUNTER — Telehealth: Payer: Self-pay | Admitting: *Deleted

## 2024-08-12 NOTE — Telephone Encounter (Signed)
 Copied from CRM [id number]. Topic: Clinical - Medication Question >> Aug 12, 2024  9:37 AM Amy B wrote: Reason for CRM: Patient states she is currently taking Trelegy and requests an alternative.  Please call back to discuss [phone]   I called and spoke with the pt  She got a notice from her ins that the cost of trelegy would go up too much for her to be able to afford next year.  She is asking if there is something else that we can prescribe that is comparable  Routing to pharm team to see if benefits investigation can be done, thanks!

## 2024-08-18 ENCOUNTER — Telehealth: Payer: Self-pay

## 2024-08-18 NOTE — Telephone Encounter (Unsigned)
 Copied from CRM [id number]. Topic: Clinical - Medication Question >> Aug 18, 2024 10:14 AM Dedra B wrote: Reason for CRM: Pt is requesting a call from the Dr. Antonio Peacock nurse to discuss alternatives for Trelegy.

## 2024-08-21 NOTE — Telephone Encounter (Signed)
ATC x 1 

## 2024-08-24 NOTE — Telephone Encounter (Signed)
 Called and got patient scheduled to see Dr.Hunsucker on 10/26.NFN

## 2024-09-02 ENCOUNTER — Encounter: Payer: Self-pay | Admitting: Pulmonary Disease

## 2024-09-02 ENCOUNTER — Ambulatory Visit: Admitting: Pulmonary Disease

## 2024-09-02 VITALS — BP 136/78 | HR 74 | Temp 98.3°F | Ht 60.0 in | Wt 163.2 lb

## 2024-09-02 DIAGNOSIS — J4489 Other specified chronic obstructive pulmonary disease: Secondary | ICD-10-CM | POA: Diagnosis not present

## 2024-09-02 DIAGNOSIS — F17210 Nicotine dependence, cigarettes, uncomplicated: Secondary | ICD-10-CM | POA: Diagnosis not present

## 2024-09-02 NOTE — Progress Notes (Signed)
 @Patient  ID: Pam Cameron, female    DOB: September 29, 1946, 78 y.o.   MRN: [id number]  Chief Complaint  Patient presents with   Follow-up    Patient wants to know alternatives to Trelegy due to increase in cost first of year.    Referring provider: Antonio Cyndee [name], *  HPI:   78 y.o. woman whom we are seeing to establish care for COPD.  Multiple prior pulmonary notes reviewed.  Patient previously followed in clinic.  Needs to establish care with new doctor.  Here for that today.  Her chief complaint today is cost of medication.  Historically been on Trelegy for last year or 2.  Since presenting to care.  After initial exacerbation of COPD per her report.  Since then no exacerbations.  No hospitalizations or ED visits.  No need for prednisone for respiratory issues.  Unclear how much it helped.  She cannot really remember what her symptoms are like before starting Trelegy.  She still has baseline dyspnea.  Baseline cough.  No worsening over time.  Relatively stable.  She is enrolled in lung cancer screening most recent CT scan 01/2024 on my review and interpretation reveals clear lungs, no significant nodules, fairly severe emphysematous changes primarily in the upper lobes although more milder emphysematous changes noted throughout.  Sounds like cost of medication is increasing although decreasing over the months.  I suspect this is related to her deductible.  We discussed alternatives to Trelegy and agreed to try to find a most cost-effective solution moving forward.  Questionaires / Pulmonary Flowsheets:   ACT:      No data to display          MMRC:     No data to display          Epworth:      No data to display          Tests:   FENO:  No results found for: NITRICOXIDE  PFT:    Latest Ref Rng & Units 12/02/2018    3:07 PM  PFT Results  FVC-Pre L 1.29   FVC-Predicted Pre % 51   FVC-Post L 1.67   FVC-Predicted Post % 66   Pre FEV1/FVC % % 54   Post  FEV1/FCV % % 53   FEV1-Pre L 0.70   FEV1-Predicted Pre % 37   FEV1-Post L 0.88   DLCO uncorrected ml/min/mmHg 10.55   DLCO UNC% % 62   DLVA Predicted % 73   TLC L 4.72   TLC % Predicted % 105   RV % Predicted % 169   Personally viewed interpreted as, spirometry consistent with severe fixed obstruction, significant bronchodilator response in FVC, lung volumes notable for air trapping, DLCO was moderately reduced.  WALK:      No data to display          Imaging: Personally reviewed and as per EMR and discussion in this note No results found.  Lab Results: Personally reviewed CBC    Component Value Date/Time   WBC 6.1 10/10/2023 1025   RBC 4.64 10/10/2023 1025   HGB 14.8 10/10/2023 1025   HCT 44.4 10/10/2023 1025   PLT 208.0 10/10/2023 1025   MCV 95.7 10/10/2023 1025   MCH 32.9 08/09/2015 0933   MCHC 33.4 10/10/2023 1025   RDW 14.2 10/10/2023 1025   LYMPHSABS 1.4 10/10/2023 1025   MONOABS 0.6 10/10/2023 1025   EOSABS 0.2 10/10/2023 1025   BASOSABS 0.1 10/10/2023 1025  BMET    Component Value Date/Time   NA 139 10/10/2023 1025   K 4.5 10/10/2023 1025   CL 101 10/10/2023 1025   CO2 28 10/10/2023 1025   GLUCOSE 91 10/10/2023 1025   BUN 14 10/10/2023 1025   CREATININE 0.69 10/10/2023 1025   CALCIUM 10.4 10/10/2023 1025   GFRNONAA >60 08/09/2015 0933   GFRAA >60 08/09/2015 0933    BNP No results found for: BNP  ProBNP No results found for: PROBNP  Specialty Problems       Pulmonary Problems   Acute upper respiratory infection   Qualifier: Diagnosis of  By: Antonio ROSALEA Rockers   IMO SNOMED Dx Update Oct 2024      COPD exacerbation [facility])   Acute upper respiratory infection   Nodule of lower lobe of left lung   Acute sinusitis   Malignant neoplasm of bronchus of left lower lobe (HCC)    Allergies  Allergen Reactions   Codeine     Abdominal cramping    Immunization History  Administered Date(s) Administered   Fluad Quad(high Dose 65+)  07/01/2019   PFIZER(Purple Top)SARS-COV-2 Vaccination 11/21/2019, 12/16/2019   PNEUMOCOCCAL CONJUGATE-20 09/27/2022   Pneumococcal Conjugate-13 06/30/2015   Pneumococcal Polysaccharide-23 03/24/2012, 06/20/2017   Respiratory Syncytial Virus Vaccine ,Recomb Aduvanted(Arexvy ) 10/10/2023   Tdap 10/10/2011   Zoster, Live 03/24/2012    Past Medical History:  Diagnosis Date   Anemia    Blood transfusion    Cataracts, bilateral    CHF (congestive heart failure) (HCC)    COPD (chronic obstructive pulmonary disease) (HCC)    Depression    Hypertension    left lung ca dx'd 08/2018   SBRT comp 01/20/19   Nodule of lower lobe of left lung 12/10/2018   Pneumonia     Tobacco History: Social History   Tobacco Use  Smoking Status Every Day   Current packs/day: 1.00   Average packs/day: 0.8 packs/day for 55.7 years (42.3 ttl pk-yrs)   Types: Cigarettes   Start date: 12/09/1968  Smokeless Tobacco Never  Tobacco Comments   Smokes 5 pack of cigarettes a week. 11/30/22 Tay   Ready to quit: Not Answered Counseling given: Not Answered Tobacco comments: Smokes 5 pack of cigarettes a week. 11/30/22 Tay   Continue to not smoke  Outpatient Encounter Medications as of 09/02/2024  Medication Sig   amLODipine  (NORVASC ) 10 MG tablet Take 1 tablet (10 mg total) by mouth daily.   Cholecalciferol (VITAMIN D ) 50 MCG (2000 UT) CAPS Take 1 capsule by mouth daily.   flintstones complete (FLINTSTONES) 60 MG chewable tablet Chew 1 tablet by mouth daily.   metoprolol  succinate (TOPROL -XL) 25 MG 24 hr tablet Take 1 tablet (25 mg total) by mouth daily.   Multiple Vitamins-Minerals (PRESERVISION AREDS 2 PO) Take by mouth.   TRELEGY ELLIPTA  100-62.5-25 MCG/ACT AEPB Inhale 1 puff into the lungs daily.   UNABLE TO FIND Med Name: pt reports taking calcium supplement, unsure of dose   No facility-administered encounter medications on file as of 09/02/2024.     Review of Systems  Review of Systems  N/a Physical  Exam  BP 136/78   Pulse 74   Temp 98.3 F (36.8 C) (Oral)   Ht 5' (1.524 m)   Wt 163 lb 3.2 oz (74 kg)   SpO2 98% Comment: room air  BMI 31.87 kg/m   Wt Readings from Last 5 Encounters:  09/02/24 163 lb 3.2 oz (74 kg)  05/06/24 165 lb (74.8 kg)  11/11/23 167  lb (75.8 kg)  10/10/23 167 lb 3.2 oz (75.8 kg)  11/30/22 173 lb (78.5 kg)    BMI Readings from Last 5 Encounters:  09/02/24 31.87 kg/m  05/06/24 32.22 kg/m  11/11/23 32.61 kg/m  10/10/23 32.65 kg/m  11/30/22 33.79 kg/m     Physical Exam General: Sitting in chair, no distress Eyes: EOMI Neck: No JVP appreciated Pulmonary: Clear, distant, normal work of breathing Cardiovascular: Regular rate and rhythm, no murmur   Assessment & Plan:   COPD/asthma overlap: Based on PFTs in 2020 with severe fixed obstruction and significant bronchodilator sponsor in [facility].  Baseline dyspnea and cough, unclear if much better with triple inhaled therapy via Trelegy.  Fortunately, no significant exacerbation since starting Trelegy.  This is a significant and encouraging development.  Discussed role and rationale for ongoing triple inhaled therapy.  Unfortunately cost seems a barrier.  Given lack of exacerbations in the last several months to years, would be reasonable to stepdown to LAMA/LABA therapy if more cost effective.  Provided a list of medications that could be altered to Trelegy given report of increased expense.  She is to let me know which would be most cost effective after discussing with her insurance carrier.  Tobacco abuse: Undergoing yearly lung cancer screening.  Most recent 01/2024 lung RADS 1.  Encouraged ongoing lung cancer screening scan next due 01/2025, this has been ordered.   Return in about 1 year (around 09/02/2025) for f/u Dr. Annella.   Donnice JONELLE Annella, MD 09/02/2024   This appointment required 41 minutes of patient care (this includes precharting, chart review, review of results, face-to-face care,  etc.).

## 2024-09-02 NOTE — Patient Instructions (Signed)
 It is nice to meet you!  Please discuss with your insurance provider what the cost of Trelegy would be over time and if it continues to come down if related to a deductible.  An alternative to Trelegy, a direct alternative, would be a medication called Breztri.  If the Trelegy and Breztri remain too expensive after discussing with them, there are additional options.  These options would not contain an inhaled steroid like Trelegy or Breztri.  These options are Anoro and Stiolto.  Please call or send me a message when you find that would be the most cost-effective option I am happy to prescribe moving forward.  Return to clinic in 1 year or sooner if needed with Dr. Annella

## 2024-09-16 DIAGNOSIS — H35722 Serous detachment of retinal pigment epithelium, left eye: Secondary | ICD-10-CM | POA: Diagnosis not present

## 2024-09-16 DIAGNOSIS — H35351 Cystoid macular degeneration, right eye: Secondary | ICD-10-CM | POA: Diagnosis not present

## 2024-09-16 DIAGNOSIS — H353132 Nonexudative age-related macular degeneration, bilateral, intermediate dry stage: Secondary | ICD-10-CM | POA: Diagnosis not present

## 2024-09-16 DIAGNOSIS — H43391 Other vitreous opacities, right eye: Secondary | ICD-10-CM | POA: Diagnosis not present

## 2024-10-12 ENCOUNTER — Encounter: Payer: Self-pay | Admitting: Family Medicine

## 2024-10-12 ENCOUNTER — Ambulatory Visit (INDEPENDENT_AMBULATORY_CARE_PROVIDER_SITE_OTHER): Payer: Medicare Other | Admitting: Family Medicine

## 2024-10-12 VITALS — BP 142/88 | HR 73 | Temp 97.9°F | Resp 18 | Ht 60.0 in | Wt 157.8 lb

## 2024-10-12 DIAGNOSIS — F17200 Nicotine dependence, unspecified, uncomplicated: Secondary | ICD-10-CM | POA: Diagnosis not present

## 2024-10-12 DIAGNOSIS — Z0000 Encounter for general adult medical examination without abnormal findings: Secondary | ICD-10-CM | POA: Diagnosis not present

## 2024-10-12 DIAGNOSIS — I10 Essential (primary) hypertension: Secondary | ICD-10-CM | POA: Diagnosis not present

## 2024-10-12 DIAGNOSIS — Z13220 Encounter for screening for lipoid disorders: Secondary | ICD-10-CM

## 2024-10-12 DIAGNOSIS — J4489 Other specified chronic obstructive pulmonary disease: Secondary | ICD-10-CM | POA: Insufficient documentation

## 2024-10-12 DIAGNOSIS — Z85118 Personal history of other malignant neoplasm of bronchus and lung: Secondary | ICD-10-CM | POA: Diagnosis not present

## 2024-10-12 LAB — COMPREHENSIVE METABOLIC PANEL WITH GFR
ALT: 19 U/L (ref 3–35)
AST: 20 U/L (ref 5–37)
Albumin: 4.2 g/dL (ref 3.5–5.2)
Alkaline Phosphatase: 127 U/L — ABNORMAL HIGH (ref 39–117)
BUN: 15 mg/dL (ref 6–23)
CO2: 31 meq/L (ref 19–32)
Calcium: 10 mg/dL (ref 8.4–10.5)
Chloride: 97 meq/L (ref 96–112)
Creatinine, Ser: 0.9 mg/dL (ref 0.40–1.20)
GFR: 61.55 mL/min
Glucose, Bld: 92 mg/dL (ref 70–99)
Potassium: 4.2 meq/L (ref 3.5–5.1)
Sodium: 135 meq/L (ref 135–145)
Total Bilirubin: 0.6 mg/dL (ref 0.2–1.2)
Total Protein: 7.2 g/dL (ref 6.0–8.3)

## 2024-10-12 LAB — CBC WITH DIFFERENTIAL/PLATELET
Basophils Absolute: 0.1 K/uL (ref 0.0–0.1)
Basophils Relative: 0.9 % (ref 0.0–3.0)
Eosinophils Absolute: 0.3 K/uL (ref 0.0–0.7)
Eosinophils Relative: 4.1 % (ref 0.0–5.0)
HCT: 44.7 % (ref 36.0–46.0)
Hemoglobin: 15.1 g/dL — ABNORMAL HIGH (ref 12.0–15.0)
Lymphocytes Relative: 19.3 % (ref 12.0–46.0)
Lymphs Abs: 1.4 K/uL (ref 0.7–4.0)
MCHC: 33.8 g/dL (ref 30.0–36.0)
MCV: 92.8 fl (ref 78.0–100.0)
Monocytes Absolute: 0.7 K/uL (ref 0.1–1.0)
Monocytes Relative: 9.8 % (ref 3.0–12.0)
Neutro Abs: 4.6 K/uL (ref 1.4–7.7)
Neutrophils Relative %: 65.9 % (ref 43.0–77.0)
Platelets: 225 K/uL (ref 150.0–400.0)
RBC: 4.81 Mil/uL (ref 3.87–5.11)
RDW: 13.7 % (ref 11.5–15.5)
WBC: 7 K/uL (ref 4.0–10.5)

## 2024-10-12 LAB — LIPID PANEL
Cholesterol: 216 mg/dL — ABNORMAL HIGH (ref 28–200)
HDL: 56.6 mg/dL
LDL Cholesterol: 125 mg/dL — ABNORMAL HIGH (ref 10–99)
NonHDL: 159.25
Total CHOL/HDL Ratio: 4
Triglycerides: 170 mg/dL — ABNORMAL HIGH (ref 10.0–149.0)
VLDL: 34 mg/dL (ref 0.0–40.0)

## 2024-10-12 LAB — TSH: TSH: 2.32 u[IU]/mL (ref 0.35–5.50)

## 2024-10-12 MED ORDER — TRELEGY ELLIPTA 100-62.5-25 MCG/ACT IN AEPB: 1.0000 | INHALATION_SPRAY | Freq: Every day | RESPIRATORY_TRACT | 3 refills | Status: AC

## 2024-10-12 MED ORDER — AMLODIPINE BESYLATE 10 MG PO TABS: 10.0000 mg | ORAL_TABLET | Freq: Every day | ORAL | 3 refills | Status: AC

## 2024-10-12 MED ORDER — METOPROLOL SUCCINATE ER 25 MG PO TB24: 25.0000 mg | ORAL_TABLET | Freq: Every day | ORAL | 3 refills | Status: AC

## 2024-10-12 NOTE — Assessment & Plan Note (Signed)
"   Per pulmonary         "

## 2024-10-12 NOTE — Assessment & Plan Note (Signed)
 Well controlled, no changes to meds. Encouraged heart healthy diet such as the DASH diet and exercise as tolerated.

## 2024-10-12 NOTE — Assessment & Plan Note (Signed)
 Ghm utd Check labs  See AVS Health Maintenance  Topic Date Due   Zoster Vaccines- Shingrix (1 of 2) 01/07/1966   DTaP/Tdap/Td (2 - Td or Tdap) 10/09/2021   COVID-19 Vaccine (3 - Pfizer risk series) 10/28/2024 (Originally 01/13/2020)   Influenza Vaccine  01/05/2025 (Originally 05/08/2024)   Lung Cancer Screening  12/22/2024   Medicare Annual Wellness (AWV)  05/06/2025   Bone Density Scan  07/31/2025   Mammogram  08/06/2025   Pneumococcal Vaccine: 50+ Years  Completed   Hepatitis C Screening  Completed   Meningococcal B Vaccine  Aged Out   Colonoscopy  Discontinued

## 2024-10-12 NOTE — Assessment & Plan Note (Signed)
 In lung cancer screening program with pulmonary

## 2024-10-12 NOTE — Assessment & Plan Note (Signed)
 Per pulmonary Refill trelegy

## 2024-10-12 NOTE — Progress Notes (Signed)
 "  Subjective:    Patient ID: Pam Cameron, female    DOB: [birth year], 79 y.o.   MRN: [id number]  Chief Complaint  Patient presents with   Annual Exam    Pt states not fasting     HPI Patient is in today for cpe.  Discussed the use of AI scribe software for clinical note transcription with the patient, who gave verbal consent to proceed. Pt with no complaints ---- she is also needing a refill on bp meds and trelegy.   History of Present Illness     Past Medical History:  Diagnosis Date   Anemia    Blood transfusion    Cataracts, bilateral    CHF (congestive heart failure) (HCC)    COPD (chronic obstructive pulmonary disease) (HCC)    Depression    Hypertension    left lung ca dx'd 08/2018   SBRT comp 01/20/19   Nodule of lower lobe of left lung 12/10/2018   Pneumonia     Past Surgical History:  Procedure Laterality Date   APPENDECTOMY     ESOPHAGOGASTRODUODENOSCOPY ENDOSCOPY     FIXATION KYPHOPLASTY LUMBAR SPINE  12/14/2011   KYPHOPLASTY N/A 08/10/2015   Procedure: Thoracic four and thoracic five kyphoplasty;  Surgeon: Morene Hicks Ditty, MD;  Location: [facility];  Service: Neurosurgery;  Laterality: N/A;  T4 and T5 kyphoplasties   LUNG SURGERY     fluid removed after pneumonia   TONSILLECTOMY     TUBAL LIGATION      Family History  Problem Relation Age of Onset   Ovarian cancer Mother    Hypertension Mother    Cancer Mother        uterine   Prostate cancer Father    Hypertension Father    Cancer Father        lung   Dementia Father    Diabetes Sister     Social History   Socioeconomic History   Marital status: Single    Spouse name: Not on file   Number of children: 0   Years of education: Not on file   Highest education level: Not on file  Occupational History   Occupation: Investment Banker, Corporate: [employer]  Tobacco Use   Smoking status: Every Day    Current packs/day: 1.00    Average packs/day: 0.8 packs/day for 55.8 years (42.4 ttl  pk-yrs)    Types: Cigarettes    Start date: 12/09/1968   Smokeless tobacco: Never   Tobacco comments:    Smokes 5 pack of cigarettes a week. 11/30/22 Tay  Substance and Sexual Activity   Alcohol use: Yes    Alcohol/week: 14.0 standard drinks of alcohol    Types: 14 Standard drinks or equivalent per week    Comment: approx one drink per day   Drug use: No   Sexual activity: Yes    Partners: Male  Other Topics Concern   Not on file  Social History Narrative   Exercise--no   Social Drivers of Health   Tobacco Use: High Risk (10/12/2024)   Patient History    Smoking Tobacco Use: Every Day    Smokeless Tobacco Use: Never    Passive Exposure: Not on file  Financial Resource Strain: Low Risk (05/06/2024)   Overall Financial Resource Strain (CARDIA)    Difficulty of Paying Living Expenses: Not hard at all  Food Insecurity: No Food Insecurity (05/06/2024)   Epic    Worried About Radiation Protection Practitioner of Food in the  Last Year: Never true    Ran Out of Food in the Last Year: Never true  Transportation Needs: No Transportation Needs (05/06/2024)   Epic    Lack of Transportation (Medical): No    Lack of Transportation (Non-Medical): No  Physical Activity: Inactive (05/06/2024)   Exercise Vital Sign    Days of Exercise per Week: 0 days    Minutes of Exercise per Session: 0 min  Stress: No Stress Concern Present (05/06/2024)   [employer] of Occupational Health - Occupational Stress Questionnaire    Feeling of Stress: Not at all  Social Connections: Moderately Isolated (05/06/2024)   Social Connection and Isolation Panel    Frequency of Communication with Friends and Family: More than three times a week    Frequency of Social Gatherings with Friends and Family: More than three times a week    Attends Religious Services: More than 4 times per year    Active Member of Clubs or Organizations: No    Attends Banker Meetings: Never    Marital Status: Divorced  Catering Manager Violence:  Not At Risk (05/06/2024)   Epic    Fear of Current or Ex-Partner: No    Emotionally Abused: No    Physically Abused: No    Sexually Abused: No  Depression (PHQ2-9): Low Risk (10/12/2024)   Depression (PHQ2-9)    PHQ-2 Score: 0  Alcohol Screen: Low Risk (05/06/2024)   Alcohol Screen    Last Alcohol Screening Score (AUDIT): 2  Housing: Low Risk (05/06/2024)   Epic    Unable to Pay for Housing in the Last Year: No    Number of Times Moved in the Last Year: 0    Homeless in the Last Year: No  Utilities: Not At Risk (05/06/2024)   Epic    Threatened with loss of utilities: No  Health Literacy: Not on file    Outpatient Medications Prior to Visit  Medication Sig Dispense Refill   Cholecalciferol (VITAMIN D ) 50 MCG (2000 UT) CAPS Take 1 capsule by mouth daily.     flintstones complete (FLINTSTONES) 60 MG chewable tablet Chew 1 tablet by mouth daily.     Multiple Vitamins-Minerals (PRESERVISION AREDS 2 PO) Take by mouth.     UNABLE TO FIND Med Name: pt reports taking calcium supplement, unsure of dose     amLODipine  (NORVASC ) 10 MG tablet Take 1 tablet (10 mg total) by mouth daily. 90 tablet 3   metoprolol  succinate (TOPROL -XL) 25 MG 24 hr tablet Take 1 tablet (25 mg total) by mouth daily. 90 tablet 3   TRELEGY ELLIPTA  100-62.5-25 MCG/ACT AEPB Inhale 1 puff into the lungs daily.     No facility-administered medications prior to visit.    Allergies  Allergen Reactions   Codeine     Abdominal cramping    Review of Systems  Constitutional:  Negative for fever and malaise/fatigue.  HENT:  Negative for congestion.   Eyes:  Negative for blurred vision.  Respiratory:  Negative for shortness of breath.   Cardiovascular:  Negative for chest pain, palpitations and leg swelling.  Gastrointestinal:  Negative for abdominal pain, blood in stool and nausea.  Genitourinary:  Negative for dysuria and frequency.  Musculoskeletal:  Negative for falls.  Skin:  Negative for rash.  Neurological:   Negative for dizziness, loss of consciousness and headaches.  Endo/Heme/Allergies:  Negative for environmental allergies.  Psychiatric/Behavioral:  Negative for depression. The patient is not nervous/anxious.        Objective:  Physical Exam Vitals and nursing note reviewed.  Constitutional:      General: She is not in acute distress.    Appearance: Normal appearance. She is well-developed.  HENT:     Head: Normocephalic and atraumatic.     Right Ear: Tympanic membrane, ear canal and external ear normal. There is no impacted cerumen.     Left Ear: Tympanic membrane, ear canal and external ear normal. There is no impacted cerumen.     Nose: Nose normal.     Mouth/Throat:     Mouth: Mucous membranes are moist.     Pharynx: Oropharynx is clear. No oropharyngeal exudate or posterior oropharyngeal erythema.  Eyes:     General: No scleral icterus.       Right eye: No discharge.        Left eye: No discharge.     Conjunctiva/sclera: Conjunctivae normal.     Pupils: Pupils are equal, round, and reactive to light.  Neck:     Thyroid : No thyromegaly or thyroid  tenderness.     Vascular: No JVD.  Cardiovascular:     Rate and Rhythm: Normal rate and regular rhythm.     Heart sounds: Normal heart sounds. No murmur heard. Pulmonary:     Effort: Pulmonary effort is normal. No respiratory distress.     Breath sounds: Normal breath sounds.  Abdominal:     General: Bowel sounds are normal. There is no distension.     Palpations: Abdomen is soft. There is no mass.     Tenderness: There is no abdominal tenderness. There is no guarding or rebound.  Musculoskeletal:        General: Normal range of motion.     Cervical back: Normal range of motion and neck supple.     Right lower leg: No edema.     Left lower leg: No edema.  Lymphadenopathy:     Cervical: No cervical adenopathy.  Skin:    General: Skin is warm and dry.     Findings: No erythema or rash.  Neurological:     Mental Status:  She is alert and oriented to person, place, and time.     Cranial Nerves: No cranial nerve deficit.     Deep Tendon Reflexes: Reflexes are normal and symmetric.  Psychiatric:        Mood and Affect: Mood normal.        Behavior: Behavior normal.        Thought Content: Thought content normal.        Judgment: Judgment normal.     BP (!) 142/88 (BP Location: Left Arm, Patient Position: Sitting, Cuff Size: Large)   Pulse 73   Temp 97.9 F (36.6 C) (Oral)   Resp 18   Ht 5' (1.524 m)   Wt 157 lb 12.8 oz (71.6 kg)   SpO2 95%   BMI 30.82 kg/m  Wt Readings from Last 3 Encounters:  10/12/24 157 lb 12.8 oz (71.6 kg)  09/02/24 163 lb 3.2 oz (74 kg)  05/06/24 165 lb (74.8 kg)    Diabetic Foot Exam - Simple   No data filed    Lab Results  Component Value Date   WBC 6.1 10/10/2023   HGB 14.8 10/10/2023   HCT 44.4 10/10/2023   PLT 208.0 10/10/2023   GLUCOSE 91 10/10/2023   CHOL 238 (H) 10/10/2023   TRIG 159.0 (H) 10/10/2023   HDL 65.30 10/10/2023   LDLCALC 141 (H) 10/10/2023   ALT 17 10/10/2023  AST 19 10/10/2023   NA 139 10/10/2023   K 4.5 10/10/2023   CL 101 10/10/2023   CREATININE 0.69 10/10/2023   BUN 14 10/10/2023   CO2 28 10/10/2023   TSH 1.97 10/10/2023   INR 0.91 12/14/2011    Lab Results  Component Value Date   TSH 1.97 10/10/2023   Lab Results  Component Value Date   WBC 6.1 10/10/2023   HGB 14.8 10/10/2023   HCT 44.4 10/10/2023   MCV 95.7 10/10/2023   PLT 208.0 10/10/2023   Lab Results  Component Value Date   NA 139 10/10/2023   K 4.5 10/10/2023   CO2 28 10/10/2023   GLUCOSE 91 10/10/2023   BUN 14 10/10/2023   CREATININE 0.69 10/10/2023   BILITOT 0.6 10/10/2023   ALKPHOS 71 10/10/2023   AST 19 10/10/2023   ALT 17 10/10/2023   PROT 7.1 10/10/2023   ALBUMIN 4.5 10/10/2023   CALCIUM 10.4 10/10/2023   ANIONGAP 8 08/09/2015   GFR 84.10 10/10/2023   Lab Results  Component Value Date   CHOL 238 (H) 10/10/2023   Lab Results  Component  Value Date   HDL 65.30 10/10/2023   Lab Results  Component Value Date   LDLCALC 141 (H) 10/10/2023   Lab Results  Component Value Date   TRIG 159.0 (H) 10/10/2023   Lab Results  Component Value Date   CHOLHDL 4 10/10/2023   No results found for: HGBA1C     Assessment & Plan:  Preventative health care Assessment & Plan: Ghm utd Check labs  See AVS Health Maintenance  Topic Date Due   Zoster Vaccines- Shingrix (1 of 2) 01/07/1966   DTaP/Tdap/Td (2 - Td or Tdap) 10/09/2021   COVID-19 Vaccine (3 - Pfizer risk series) 10/28/2024 (Originally 01/13/2020)   Influenza Vaccine  01/05/2025 (Originally 05/08/2024)   Lung Cancer Screening  12/22/2024   Medicare Annual Wellness (AWV)  05/06/2025   Bone Density Scan  07/31/2025   Mammogram  08/06/2025   Pneumococcal Vaccine: 50+ Years  Completed   Hepatitis C Screening  Completed   Meningococcal B Vaccine  Aged Out   Colonoscopy  Discontinued      Essential hypertension Assessment & Plan: Well controlled, no changes to meds. Encouraged heart healthy diet such as the DASH diet and exercise as tolerated.    Orders: -     amLODIPine  Besylate; Take 1 tablet (10 mg total) by mouth daily.  Dispense: 90 tablet; Refill: 3 -     Metoprolol  Succinate ER; Take 1 tablet (25 mg total) by mouth daily.  Dispense: 90 tablet; Refill: 3 -     CBC with Differential/Platelet -     Comprehensive metabolic panel with GFR -     Lipid panel -     TSH  History of malignant neoplasm of lower lobe bronchus or lung Assessment & Plan: Per pulmonary    COPD with asthma (HCC) Assessment & Plan: Per pulmonary Refill trelegy  Orders: -     Trelegy Ellipta ; Inhale 1 puff into the lungs daily.  Dispense: 3 each; Refill: 3  Smoker Assessment & Plan: In lung cancer screening program with pulmonary    Assessment and Plan Assessment & Plan    Pam JONELLE Antonio Cyndee, DO  "

## 2024-10-13 ENCOUNTER — Encounter: Payer: Self-pay | Admitting: Acute Care

## 2024-10-17 ENCOUNTER — Ambulatory Visit: Payer: Self-pay | Admitting: Family Medicine

## 2024-10-17 DIAGNOSIS — E785 Hyperlipidemia, unspecified: Secondary | ICD-10-CM

## 2024-12-23 ENCOUNTER — Ambulatory Visit (HOSPITAL_COMMUNITY)

## 2025-05-12 ENCOUNTER — Ambulatory Visit

## 2025-10-14 ENCOUNTER — Encounter: Admitting: Family Medicine
